# Patient Record
Sex: Female | Born: 1950
Health system: Southern US, Community
[De-identification: ages and names within clinical notes are randomized; demographics above are authoritative.]

## PROBLEM LIST (undated history)

## (undated) DIAGNOSIS — H269 Unspecified cataract: Secondary | ICD-10-CM

## (undated) DIAGNOSIS — E119 Type 2 diabetes mellitus without complications: Secondary | ICD-10-CM

## (undated) DIAGNOSIS — R112 Nausea with vomiting, unspecified: Secondary | ICD-10-CM

## (undated) DIAGNOSIS — Z5189 Encounter for other specified aftercare: Secondary | ICD-10-CM

## (undated) DIAGNOSIS — C801 Malignant (primary) neoplasm, unspecified: Secondary | ICD-10-CM

## (undated) DIAGNOSIS — Z1379 Encounter for other screening for genetic and chromosomal anomalies: Principal | ICD-10-CM

## (undated) DIAGNOSIS — T7840XA Allergy, unspecified, initial encounter: Secondary | ICD-10-CM

## (undated) DIAGNOSIS — Z9889 Other specified postprocedural states: Secondary | ICD-10-CM

## (undated) DIAGNOSIS — R21 Rash and other nonspecific skin eruption: Secondary | ICD-10-CM

## (undated) DIAGNOSIS — J189 Pneumonia, unspecified organism: Secondary | ICD-10-CM

## (undated) DIAGNOSIS — R252 Cramp and spasm: Secondary | ICD-10-CM

## (undated) HISTORY — DX: Allergy, unspecified, initial encounter: T78.40XA

## (undated) HISTORY — PX: TUBAL LIGATION: SHX77

## (undated) HISTORY — DX: Unspecified cataract: H26.9

## (undated) HISTORY — DX: Encounter for other specified aftercare: Z51.89

## (undated) HISTORY — PX: EYE SURGERY: SHX253

## (undated) HISTORY — PX: MOUTH SURGERY: SHX715

## (undated) HISTORY — PX: ABDOMINAL HYSTERECTOMY: SHX81

## (undated) HISTORY — DX: Encounter for other screening for genetic and chromosomal anomalies: Z13.79

## (undated) HISTORY — PX: DILATION AND CURETTAGE OF UTERUS: SHX78

## (undated) HISTORY — PX: COLONOSCOPY: SHX174

---

## 1998-11-17 ENCOUNTER — Ambulatory Visit (HOSPITAL_COMMUNITY): Admission: RE | Admit: 1998-11-17 | Discharge: 1998-11-17 | Payer: Self-pay | Admitting: Obstetrics and Gynecology

## 1998-11-25 ENCOUNTER — Encounter: Payer: Self-pay | Admitting: Obstetrics and Gynecology

## 1998-11-25 ENCOUNTER — Ambulatory Visit (HOSPITAL_COMMUNITY): Admission: RE | Admit: 1998-11-25 | Discharge: 1998-11-25 | Payer: Self-pay | Admitting: Obstetrics and Gynecology

## 1999-09-02 ENCOUNTER — Other Ambulatory Visit: Admission: RE | Admit: 1999-09-02 | Discharge: 1999-09-02 | Payer: Self-pay | Admitting: Obstetrics and Gynecology

## 2001-07-05 ENCOUNTER — Ambulatory Visit (HOSPITAL_COMMUNITY): Admission: RE | Admit: 2001-07-05 | Discharge: 2001-07-05 | Payer: Self-pay | Admitting: Internal Medicine

## 2001-08-07 ENCOUNTER — Other Ambulatory Visit: Admission: RE | Admit: 2001-08-07 | Discharge: 2001-08-07 | Payer: Self-pay | Admitting: Obstetrics and Gynecology

## 2002-11-28 ENCOUNTER — Other Ambulatory Visit: Admission: RE | Admit: 2002-11-28 | Discharge: 2002-11-28 | Payer: Self-pay | Admitting: Obstetrics and Gynecology

## 2003-05-02 ENCOUNTER — Other Ambulatory Visit: Admission: RE | Admit: 2003-05-02 | Discharge: 2003-05-02 | Payer: Self-pay | Admitting: Obstetrics and Gynecology

## 2004-04-23 ENCOUNTER — Other Ambulatory Visit: Admission: RE | Admit: 2004-04-23 | Discharge: 2004-04-23 | Payer: Self-pay | Admitting: Obstetrics and Gynecology

## 2004-06-17 ENCOUNTER — Encounter: Admission: RE | Admit: 2004-06-17 | Discharge: 2004-06-17 | Payer: Self-pay | Admitting: Gastroenterology

## 2004-07-27 ENCOUNTER — Ambulatory Visit (HOSPITAL_COMMUNITY): Admission: RE | Admit: 2004-07-27 | Discharge: 2004-07-27 | Payer: Self-pay | Admitting: Gastroenterology

## 2004-07-27 ENCOUNTER — Encounter (INDEPENDENT_AMBULATORY_CARE_PROVIDER_SITE_OTHER): Payer: Self-pay | Admitting: *Deleted

## 2005-11-04 ENCOUNTER — Other Ambulatory Visit: Admission: RE | Admit: 2005-11-04 | Discharge: 2005-11-04 | Payer: Self-pay | Admitting: Obstetrics and Gynecology

## 2010-02-27 ENCOUNTER — Emergency Department (HOSPITAL_COMMUNITY): Admission: EM | Admit: 2010-02-27 | Discharge: 2010-02-27 | Payer: Self-pay | Admitting: Emergency Medicine

## 2011-05-07 NOTE — Op Note (Signed)
NAME:  Angela Mays, Angela Mays                          ACCOUNT NO.:  [id number]   MEDICAL RECORD NO.:  [id number]                   PATIENT TYPE:  AMB   LOCATION:  ENDO                                 FACILITY:  [facility]   PHYSICIAN:  Anselmo Rod, M.D.               DATE OF BIRTH:  03-13-1951   DATE OF PROCEDURE:  DATE OF DISCHARGE:                                 OPERATIVE REPORT   ADDENDUM:  Addendum to report #[id number].   Some patchy erythema was noted in the distal small-bowel and this area was  biopsied for pathology to rule out sprue.  The patient has a history of  anemia in the past.                                               Anselmo Rod, M.D.    JNM/MEDQ  D:  07/27/2004  T:  07/27/2004  Job:  [id number]   cc:   Dois Davenport A. Rivard, M.D.  [address]  [place]  [place] [address]  Fax: [phone]

## 2011-05-07 NOTE — Op Note (Signed)
NAME:  Angela Mays, ROCHEFORT                          ACCOUNT NO.:  [id number]   MEDICAL RECORD NO.:  [id number]                   PATIENT TYPE:  AMB   LOCATION:  ENDO                                 FACILITY:  [facility]   PHYSICIAN:  Anselmo Rod, M.D.               DATE OF BIRTH:  06-18-1951   DATE OF PROCEDURE:  07/27/2004  DATE OF DISCHARGE:                                 OPERATIVE REPORT   PROCEDURE PERFORMED:  Esophagogastroduodenoscopy with biopsies.   ENDOSCOPIST:  Charna Elizabeth, M.D.   INSTRUMENT USED:  Olympus video pan endoscope.   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  Esophageal peristalsis was noted.  An EGD is being done to rule out esophagitis, etc.   PREPROCEDURE PREPARATION:  Informed consent was secured from the patient.  The patient fasted for eight hours prior to procedure.   PREPROCEDURE PHYSICAL:  Patient stable vital signs.  NECK:  Supple.  CHEST:  Clear to auscultation.  S1, S2 regular.  ABDOMEN:  Soft with normal bowel sounds.   DESCRIPTION OF THE PROCEDURE:  The patient was placed in the left lateral  decubitus position, sedated with 50 mg of Demerol and 7 mg of Versed in slow  incremental doses.  Once the patient was adequately sedated and maintained  on low flow oxygen and continuous cardiac monitoring, the Olympus video pan  endoscope was advanced with the mouthpiece over the tongue, into the  esophagus under direct vision.  The entire esophagus appeared normal with no  evidence of ring, esophagitis or Barrett's mucosa.  The esophagus was widely  patent.  A small hiatal hernia was seen.  On high retroflexion, antral  gastritis was noted with some erosions in the duodenal bulb.  Small bowel  __________ up to 60 cm appeared normal.  Biopsies were done from the antrum  to rule out presence of H. pylori by  pathology.   IMPRESSION:  1. Widely patent esophagus.  2. Small hiatal hernia.  3. Antral gastritis with erosions in the duodenal bulb, biopsies done for     Helicobacter pylori.   RECOMMENDATIONS:  1. Await pathology results.  2. Avoid nonsteroidal including aspirin for the next four weeks.  3. Outpatient followup in the next two weeks.  Further recommendations will     be made at that time.                                               Anselmo Rod, M.D.    JNM/MEDQ  D:  07/27/2004  T:  07/27/2004  Job:  [id number]   cc:   Dois Davenport A. Rivard, M.D.  [phone]  [address]  [place]  [place] [address]  Fax: [phone]   Charlaine Dalton. Sherene Sires, M.D. [facility]

## 2011-05-07 NOTE — Op Note (Signed)
NAME:  Angela Mays, FUNCHES NO.:  [id number]   MEDICAL RECORD NO.:  [id number]                   PATIENT TYPE:  AMB   LOCATION:  ENDO                                 FACILITY:  [facility]   PHYSICIAN:  Anselmo Rod, M.D.               DATE OF BIRTH:  Jul 12, 1951   DATE OF PROCEDURE:  07/27/2004  DATE OF DISCHARGE:                                 OPERATIVE REPORT   PROCEDURE PERFORMED:  Colonoscopy with multiple biopsies from the right  colon.   ENDOSCOPIST:  Anselmo Rod, M.D.   INSTRUMENT USED:  Olympus video colonoscope.   INDICATIONS FOR PROCEDURE:  A 60 year old white female who underwent  screening colonoscopy.  There is a history of colon cancer in a paternal  grandmother.  Rule out colonic polyps, masses, etc.   PREPROCEDURE PREPARATION:  Informed consent was procured from the patient.  The patient fasted for eight hours prior to the procedure and prepped with a  bottle of magnesium citrate and a gallop of GoLYTELY the night prior to the  procedure.   PREPROCEDURE PHYSICAL:  VITAL SIGNS:  Stable vital signs.  NECK:  Supple.  CHEST:  Clear to auscultation.  CARDIOVASCULAR:  S1 and S2 regular.  ABDOMEN:  Soft with normal bowel sounds.   DESCRIPTION OF PROCEDURE:  The patient was placed in left lateral decubitus  position, sedated with an additional 20 mg of Demerol and 1 mg of Versed in  slow incremental doses.  Once the patient was adequately sedated and  maintained on low flow oxygen and continuous cardiac monitoring, the Olympus  video colonoscope was advanced from the rectum to the cecum.  Nodular  changes were noted in the mucosa of the right colon.  Multiple biopsies were  done.  Small internal hemorrhoids were appreciated on retroflexion.  The  rectum, appendiceal orifice and the ileocecal valve were clearly visualized  and photographed.  The terminal ileum appeared healthy and without lesions.  No masses, polyps erosions or diverticula  were appreciated.  The patient  tolerated the procedure well without any immediate complications.   IMPRESSION:  1. Small nonbleeding internal hemorrhoids.  2. Nodular changes in mucosa of right colon biopsied.  3. No masses, polyps or diverticula seen.  4. Normal terminal ileum.  5. Some patchy erythema was noted in the distal small-bowel and this area     was biopsied for pathology to rule out sprue.  The patient has a history     of anemia in the past.   RECOMMENDATIONS:  1. Await pathology results.  2. Outpatient follow-up in the next two weeks for further recommendations.                                               Jyothi Nat  Loreta Ave, M.D.    JNM/MEDQ  D:  07/27/2004  T:  07/27/2004  Job:  [id number]   cc:   Dois Davenport A. Rivard, M.D.  [address], [address]  [place]  [place] [address]  Fax: [phone]   Charlaine Dalton. Sherene Sires, M.D. [facility]

## 2014-07-08 ENCOUNTER — Encounter (HOSPITAL_COMMUNITY): Payer: Self-pay | Admitting: Emergency Medicine

## 2014-07-08 ENCOUNTER — Emergency Department (INDEPENDENT_AMBULATORY_CARE_PROVIDER_SITE_OTHER)
Admission: EM | Admit: 2014-07-08 | Discharge: 2014-07-08 | Disposition: A | Discharge status: Home / Self Care | Payer: Managed Care, Other (non HMO) | Source: Home / Self Care | Attending: Family Medicine | Admitting: Family Medicine

## 2014-07-08 DIAGNOSIS — R002 Palpitations: Secondary | ICD-10-CM

## 2014-07-08 DIAGNOSIS — M19079 Primary osteoarthritis, unspecified ankle and foot: Secondary | ICD-10-CM

## 2014-07-08 DIAGNOSIS — L259 Unspecified contact dermatitis, unspecified cause: Secondary | ICD-10-CM

## 2014-07-08 MED ORDER — PREDNISONE 10 MG PO TABS: ORAL_TABLET | ORAL | Status: DC

## 2014-07-08 MED ORDER — BETAMETHASONE DIPROPIONATE AUG 0.05 % EX CREA: TOPICAL_CREAM | Freq: Two times a day (BID) | CUTANEOUS | Status: DC

## 2014-07-08 MED ORDER — MELOXICAM 15 MG PO TABS: 15.0000 mg | ORAL_TABLET | Freq: Every day | ORAL | Status: DC

## 2014-07-08 NOTE — Discharge Instructions (Signed)
Contact Dermatitis Contact dermatitis is a reaction to certain substances that touch the skin. Contact dermatitis can be either irritant contact dermatitis or allergic contact dermatitis. Irritant contact dermatitis does not require previous exposure to the substance for a reaction to occur.Allergic contact dermatitis only occurs if you have been exposed to the substance before. Upon a repeat exposure, your body reacts to the substance.  CAUSES  Many substances can cause contact dermatitis. Irritant dermatitis is most commonly caused by repeated exposure to mildly irritating substances, such as:  Makeup.  Soaps.  Detergents.  Bleaches.  Acids.  Metal salts, such as nickel. Allergic contact dermatitis is most commonly caused by exposure to:  Poisonous plants.  Chemicals (deodorants, shampoos).  Jewelry.  Latex.  Neomycin in triple antibiotic cream.  Preservatives in products, including clothing. SYMPTOMS  The area of skin that is exposed may develop:  Dryness or flaking.  Redness.  Cracks.  Itching.  Pain or a burning sensation.  Blisters. With allergic contact dermatitis, there may also be swelling in areas such as the eyelids, mouth, or genitals.  DIAGNOSIS  Your caregiver can usually tell what the problem is by doing a physical exam. In cases where the cause is uncertain and an allergic contact dermatitis is suspected, a patch skin test may be performed to help determine the cause of your dermatitis. TREATMENT Treatment includes protecting the skin from further contact with the irritating substance by avoiding that substance if possible. Barrier creams, powders, and gloves may be helpful. Your caregiver may also recommend:  Steroid creams or ointments applied 2 times daily. For best results, soak the rash area in cool water for 20 minutes. Then apply the medicine. Cover the area with a plastic wrap. You can store the steroid cream in the refrigerator for a "chilly"  effect on your rash. That may decrease itching. Oral steroid medicines may be needed in more severe cases.  Antibiotics or antibacterial ointments if a skin infection is present.  Antihistamine lotion or an antihistamine taken by mouth to ease itching.  Lubricants to keep moisture in your skin.  Burow's solution to reduce redness and soreness or to dry a weeping rash. Mix one packet or tablet of solution in 2 cups cool water. Dip a clean washcloth in the mixture, wring it out a bit, and put it on the affected area. Leave the cloth in place for 30 minutes. Do this as often as possible throughout the day.  Taking several cornstarch or baking soda baths daily if the area is too large to cover with a washcloth. Harsh chemicals, such as alkalis or acids, can cause skin damage that is like a burn. You should flush your skin for 15 to 20 minutes with cold water after such an exposure. You should also seek immediate medical care after exposure. Bandages (dressings), antibiotics, and pain medicine may be needed for severely irritated skin.  HOME CARE INSTRUCTIONS  Avoid the substance that caused your reaction.  Keep the area of skin that is affected away from hot water, soap, sunlight, chemicals, acidic substances, or anything else that would irritate your skin.  Do not scratch the rash. Scratching may cause the rash to become infected.  You may take cool baths to help stop the itching.  Only take over-the-counter or prescription medicines as directed by your caregiver.  See your caregiver for follow-up care as directed to make sure your skin is healing properly. SEEK MEDICAL CARE IF:   Your condition is not better after 3  days of treatment.  You seem to be getting worse.  You see signs of infection such as swelling, tenderness, redness, soreness, or warmth in the affected area.  You have any problems related to your medicines. Document Released: 12/03/2000 Document Revised: 02/28/2012  Document Reviewed: 05/11/2011 [facility] Patient Information 2015 [place], [place]. This information is not intended to replace advice given to you by your health care provider. Make sure you discuss any questions you have with your health care provider.  Arthritis, Nonspecific Arthritis is inflammation of a joint. This usually means pain, redness, warmth or swelling are present. One or more joints may be involved. There are a number of types of arthritis. Your caregiver may not be able to tell what type of arthritis you have right away. CAUSES  The most common cause of arthritis is the wear and tear on the joint (osteoarthritis). This causes damage to the cartilage, which can break down over time. The knees, hips, back and neck are most often affected by this type of arthritis. Other types of arthritis and common causes of joint pain include:  Sprains and other injuries near the joint. Sometimes minor sprains and injuries cause pain and swelling that develop hours later.  Rheumatoid arthritis. This affects hands, feet and knees. It usually affects both sides of your body at the same time. It is often associated with chronic ailments, fever, weight loss and general weakness.  Crystal arthritis. Gout and pseudo gout can cause occasional acute severe pain, redness and swelling in the foot, ankle, or knee.  Infectious arthritis. Bacteria can get into a joint through a break in overlying skin. This can cause infection of the joint. Bacteria and viruses can also spread through the blood and affect your joints.  Drug, infectious and allergy reactions. Sometimes joints can become mildly painful and slightly swollen with these types of illnesses. SYMPTOMS   Pain is the main symptom.  Your joint or joints can also be red, swollen and warm or hot to the touch.  You may have a fever with certain types of arthritis, or even feel overall ill.  The joint with arthritis will hurt with movement. Stiffness is  present with some types of arthritis. DIAGNOSIS  Your caregiver will suspect arthritis based on your description of your symptoms and on your exam. Testing may be needed to find the type of arthritis:  Blood and sometimes urine tests.  X-ray tests and sometimes CT or MRI scans.  Removal of fluid from the joint (arthrocentesis) is done to check for bacteria, crystals or other causes. Your caregiver (or a specialist) will numb the area over the joint with a local anesthetic, and use a needle to remove joint fluid for examination. This procedure is only minimally uncomfortable.  Even with these tests, your caregiver may not be able to tell what kind of arthritis you have. Consultation with a specialist (rheumatologist) may be helpful. TREATMENT  Your caregiver will discuss with you treatment specific to your type of arthritis. If the specific type cannot be determined, then the following general recommendations may apply. Treatment of severe joint pain includes:  Rest.  Elevation.  Anti-inflammatory medication (for example, ibuprofen) may be prescribed. Avoiding activities that cause increased pain.  Only take over-the-counter or prescription medicines for pain and discomfort as recommended by your caregiver.  Cold packs over an inflamed joint may be used for 10 to 15 minutes every hour. Hot packs sometimes feel better, but do not use overnight. Do not use hot packs  if you are diabetic without your caregiver's permission.  A cortisone shot into arthritic joints may help reduce pain and swelling.  Any acute arthritis that gets worse over the next 1 to 2 days needs to be looked at to be sure there is no joint infection. Long-term arthritis treatment involves modifying activities and lifestyle to reduce joint stress jarring. This can include weight loss. Also, exercise is needed to nourish the joint cartilage and remove waste. This helps keep the muscles around the joint strong. HOME CARE  INSTRUCTIONS   Do not take aspirin to relieve pain if gout is suspected. This elevates uric acid levels.  Only take over-the-counter or prescription medicines for pain, discomfort or fever as directed by your caregiver.  Rest the joint as much as possible.  If your joint is swollen, keep it elevated.  Use crutches if the painful joint is in your leg.  Drinking plenty of fluids may help for certain types of arthritis.  Follow your caregiver's dietary instructions.  Try low-impact exercise such as:  Swimming.  Water aerobics.  Biking.  Walking.  Morning stiffness is often relieved by a warm shower.  Put your joints through regular range-of-motion. SEEK MEDICAL CARE IF:   You do not feel better in 24 hours or are getting worse.  You have side effects to medications, or are not getting better with treatment. SEEK IMMEDIATE MEDICAL CARE IF:   You have a fever.  You develop severe joint pain, swelling or redness.  Many joints are involved and become painful and swollen.  There is severe back pain and/or leg weakness.  You have loss of bowel or bladder control. Document Released: 01/13/2005 Document Revised: 02/28/2012 Document Reviewed: 01/29/2009 [facility] Patient Information 2015 [place], [place]. This information is not intended to replace advice given to you by your health care provider. Make sure you discuss any questions you have with your health care provider.  Palpitations A palpitation is the feeling that your heartbeat is irregular or is faster than normal. It may feel like your heart is fluttering or skipping a beat. Palpitations are usually not a serious problem. However, in some cases, you may need further medical evaluation. CAUSES  Palpitations can be caused by:  Smoking.  Caffeine or other stimulants, such as diet pills or energy drinks.  Alcohol.  Stress and anxiety.  Strenuous physical activity.  Fatigue.  Certain medicines.  Heart disease,  especially if you have a history of irregular heart rhythms (arrhythmias), such as atrial fibrillation, atrial flutter, or supraventricular tachycardia.  An improperly working pacemaker or defibrillator. DIAGNOSIS  To find the cause of your palpitations, your health care provider will take your medical history and perform a physical exam. Your health care provider may also have you take a test called an ambulatory electrocardiogram (ECG). An ECG records your heartbeat patterns over a 24-hour period. You may also have other tests, such as:  Transthoracic echocardiogram (TTE). During echocardiography, sound waves are used to evaluate how blood flows through your heart.  Transesophageal echocardiogram (TEE).  Cardiac monitoring. This allows your health care provider to monitor your heart rate and rhythm in real time.  Holter monitor. This is a portable device that records your heartbeat and can help diagnose heart arrhythmias. It allows your health care provider to track your heart activity for several days, if needed.  Stress tests by exercise or by giving medicine that makes the heart beat faster. TREATMENT  Treatment of palpitations depends on the cause of your symptoms  and can vary greatly. Most cases of palpitations do not require any treatment other than time, relaxation, and monitoring your symptoms. Other causes, such as atrial fibrillation, atrial flutter, or supraventricular tachycardia, usually require further treatment. HOME CARE INSTRUCTIONS   Avoid:  Caffeinated coffee, tea, soft drinks, diet pills, and energy drinks.  Chocolate.  Alcohol.  Stop smoking if you smoke.  Reduce your stress and anxiety. Things that can help you relax include:  A method of controlling things in your body, such as your heartbeats, with your mind (biofeedback).  Yoga.  Meditation.  Physical activity such as swimming, jogging, or walking.  Get plenty of rest and sleep. SEEK MEDICAL CARE IF:    You continue to have a fast or irregular heartbeat beyond 24 hours.  Your palpitations occur more often. SEEK IMMEDIATE MEDICAL CARE IF:  You have chest pain or shortness of breath.  You have a severe headache.  You feel dizzy or you faint. MAKE SURE YOU:  Understand these instructions.  Will watch your condition.  Will get help right away if you are not doing well or get worse. Document Released: 12/03/2000 Document Revised: 12/11/2013 Document Reviewed: 02/04/2012 [facility] Patient Information 2015 [place], [place]. This information is not intended to replace advice given to you by your health care provider. Make sure you discuss any questions you have with your health care provider.

## 2014-07-08 NOTE — ED Notes (Signed)
Pt c/o intermittent right ankle pain x3 weeks Sx also include swelling; pain is 4/10 Also c/o rash on both arms x1 week  Denies f/v/n/d, cold sx Alert w/no signs of acute distress.

## 2014-07-08 NOTE — ED Provider Notes (Signed)
CSN: [id number]     Arrival date & time 07/08/14  1740 History   First MD Initiated Contact with Patient 07/08/14 1904     Chief Complaint  Patient presents with  . Ankle Pain   (Consider location/radiation/quality/duration/timing/severity/associated sxs/prior Treatment) HPI Comments: 63 year old female presents with multiple complaints.   She has right ankle pain, intermittent, for 3 weeks. The pain is worse when she has been sitting for a long time or when she first wakes up in the morning and seems to loosen up with walking. She feels the pain in the medial and lateral joint lines of the ankle. She has some intermittent mild swelling of the ankle. No treatment attempted.  She also complains of a rash on both elbows for about a week. It started as one small bump, and now she has multiple bumps on the extensor surfaces of the elbows that are itchy. She has never had this before. The treatment attempted. No rash elsewhere.  Finally, she complains of frequent palpitations. She has had these for months. They seem to gotten worse. She feels slightly breathless at times. No other associated symptoms such as chest pain, leg swelling, shortness of breath.   History reviewed. No pertinent past medical history. History reviewed. No pertinent past surgical history. No family history on file. History  Substance Use Topics  . Smoking status: Never Smoker   . Smokeless tobacco: Not on file  . Alcohol Use: No   OB History   Grav Para Term Preterm Abortions TAB SAB Ect Mult Living                 Review of Systems  Constitutional: Negative for fever and chills.  Eyes: Negative for visual disturbance.  Respiratory: Negative for cough, chest tightness and shortness of breath.   Cardiovascular: Positive for palpitations. Negative for chest pain and leg swelling.  Gastrointestinal: Negative for nausea, vomiting and abdominal pain.  Endocrine: Negative for polydipsia and polyuria.  Genitourinary:  Negative for dysuria, urgency and frequency.  Musculoskeletal: Positive for arthralgias and joint swelling. Negative for back pain, gait problem and myalgias.  Skin: Positive for rash.  Neurological: Negative for dizziness, weakness and light-headedness.  All other systems reviewed and are negative.   Allergies  Flagyl  Home Medications   Prior to Admission medications   Medication Sig Start Date End Date Taking? Authorizing Provider  augmented betamethasone dipropionate (DIPROLENE AF) 0.05 % cream Apply topically 2 (two) times daily. 07/08/14   Liam Graham, PA-C  meloxicam (MOBIC) 15 MG tablet Take 1 tablet (15 mg total) by mouth daily. 07/08/14   Liam Graham, PA-C  predniSONE (DELTASONE) 10 MG tablet 4 tabs PO QD for 4 days; 3 tabs PO QD for 3 days; 2 tabs PO QD for 2 days; 1 tab PO QD for 1 day 07/08/14   Liam Graham, PA-C   BP 171/77  Pulse 72  Temp(Src) 98.5 F (36.9 C) (Oral)  Resp 12  SpO2 98% Physical Exam  Nursing note and vitals reviewed. Constitutional: She is oriented to person, place, and time. Vital signs are normal. She appears well-developed and well-nourished. No distress.  HENT:  Head: Normocephalic and atraumatic.  Cardiovascular: Normal rate, regular rhythm, normal heart sounds, intact distal pulses and normal pulses.  Frequent extrasystoles are present.  No murmur heard. Pulmonary/Chest: Effort normal. No respiratory distress.  Musculoskeletal:       Right ankle: She exhibits swelling (mild, diffuse, not extending down to the foot). She exhibits  normal range of motion and normal pulse. Tenderness (Mild, medial and lateral, just distal to the malleolus). Achilles tendon normal.       Left ankle: Normal. Achilles tendon normal.  Neurological: She is alert and oriented to person, place, and time. She has normal strength. Coordination normal.  Skin: Skin is warm and dry. Rash noted. Rash is papular (extensor surfaces of both elbows, worse on the left).  She is not diaphoretic.  Psychiatric: She has a normal mood and affect. Judgment normal.    ED Course  ED EKG  Date/Time: 07/09/2014 8:16 AM Performed by: Allena Katz, H Authorized by: Allena Katz, H Rhythm: sinus rhythm Rate: normal QRS axis: normal Conduction: conduction normal ST Segments: ST segments normal T Waves: T waves normal Other: no other findings Clinical impression: normal ECG Comments: 2 minute rhythm strip obtained with no arrhythmia noted   (including critical care time) Labs Review Labs Reviewed - No data to display  Imaging Review No results found.   MDM   1. Contact dermatitis   2. Arthritis of ankle   3. Palpitations    The rash on the elbows is consistent with contact dermatitis The ankle history and exam is consistent with mild to moderate arthritis EKG obtained to evaluate palpitations, it was entirely normal Treating with steroid cream on the elbow, or prednisone for both the rash any arthritis, daily anti-inflammatory. Followup when necessary  Meds ordered this encounter  Medications  . augmented betamethasone dipropionate (DIPROLENE AF) 0.05 % cream    Sig: Apply topically 2 (two) times daily.    Dispense:  30 g    Refill:  0    Order Specific Question:  Supervising Provider    Answer:  Melony Overly [id number]  . predniSONE (DELTASONE) 10 MG tablet    Sig: 4 tabs PO QD for 4 days; 3 tabs PO QD for 3 days; 2 tabs PO QD for 2 days; 1 tab PO QD for 1 day    Dispense:  30 tablet    Refill:  0    Order Specific Question:  Supervising Provider    Answer:  Melony Overly [id number]  . meloxicam (MOBIC) 15 MG tablet    Sig: Take 1 tablet (15 mg total) by mouth daily.    Dispense:  30 tablet    Refill:  1    Order Specific Question:  Supervising Provider    Answer:  Melony Overly [[id number]]      Liam Graham, PA-C 07/09/14 [phone]

## 2014-07-09 NOTE — ED Provider Notes (Signed)
Medical screening examination/treatment/procedure(s) were performed by resident physician or non-physician practitioner and as supervising physician I was immediately available for consultation/collaboration.   Pauline Good MD.   Billy Fischer, MD 07/09/14 1130

## 2014-10-29 ENCOUNTER — Ambulatory Visit: Payer: Managed Care, Other (non HMO)

## 2014-11-05 ENCOUNTER — Ambulatory Visit: Payer: Managed Care, Other (non HMO)

## 2014-11-12 ENCOUNTER — Ambulatory Visit: Payer: Managed Care, Other (non HMO)

## 2014-11-20 ENCOUNTER — Ambulatory Visit: Payer: Self-pay | Admitting: Internal Medicine

## 2015-03-03 DIAGNOSIS — Z13220 Encounter for screening for lipoid disorders: Secondary | ICD-10-CM | POA: Diagnosis not present

## 2015-03-03 DIAGNOSIS — E1165 Type 2 diabetes mellitus with hyperglycemia: Secondary | ICD-10-CM | POA: Diagnosis not present

## 2015-03-03 DIAGNOSIS — Z23 Encounter for immunization: Secondary | ICD-10-CM | POA: Diagnosis not present

## 2015-07-01 DIAGNOSIS — J209 Acute bronchitis, unspecified: Secondary | ICD-10-CM | POA: Diagnosis not present

## 2015-08-29 ENCOUNTER — Other Ambulatory Visit: Payer: Self-pay | Admitting: Obstetrics and Gynecology

## 2015-08-29 DIAGNOSIS — Z6835 Body mass index (BMI) 35.0-35.9, adult: Secondary | ICD-10-CM | POA: Diagnosis not present

## 2015-08-29 DIAGNOSIS — Z1231 Encounter for screening mammogram for malignant neoplasm of breast: Secondary | ICD-10-CM | POA: Diagnosis not present

## 2015-08-29 DIAGNOSIS — Z124 Encounter for screening for malignant neoplasm of cervix: Secondary | ICD-10-CM | POA: Diagnosis not present

## 2015-09-01 LAB — CYTOLOGY - PAP

## 2015-09-02 ENCOUNTER — Other Ambulatory Visit: Payer: Self-pay | Admitting: Obstetrics and Gynecology

## 2015-09-02 DIAGNOSIS — R928 Other abnormal and inconclusive findings on diagnostic imaging of breast: Secondary | ICD-10-CM

## 2015-09-03 DIAGNOSIS — E119 Type 2 diabetes mellitus without complications: Secondary | ICD-10-CM | POA: Diagnosis not present

## 2015-09-09 ENCOUNTER — Ambulatory Visit
Admission: RE | Admit: 2015-09-09 | Discharge: 2015-09-09 | Disposition: A | Discharge status: Home / Self Care | Payer: Medicare Other | Source: Ambulatory Visit | Attending: Obstetrics and Gynecology | Admitting: Obstetrics and Gynecology

## 2015-09-09 DIAGNOSIS — R928 Other abnormal and inconclusive findings on diagnostic imaging of breast: Secondary | ICD-10-CM

## 2016-03-24 DIAGNOSIS — Z23 Encounter for immunization: Secondary | ICD-10-CM | POA: Diagnosis not present

## 2016-03-24 DIAGNOSIS — Z7984 Long term (current) use of oral hypoglycemic drugs: Secondary | ICD-10-CM | POA: Diagnosis not present

## 2016-03-24 DIAGNOSIS — E119 Type 2 diabetes mellitus without complications: Secondary | ICD-10-CM | POA: Diagnosis not present

## 2016-03-24 DIAGNOSIS — Z0000 Encounter for general adult medical examination without abnormal findings: Secondary | ICD-10-CM | POA: Diagnosis not present

## 2016-07-24 IMAGING — MG MM DIAG BREAST TOMO UNI RIGHT
4 series · 4 of 12 positions shown · non-contrast
Comparison: Previous exam(s).

CLINICAL DATA: 64-year-old female presenting for screening recall
of a possible area of distortion in the right breast.

EXAM:
DIGITAL DIAGNOSTIC RIGHT MAMMOGRAM WITH 3D TOMOSYNTHESIS AND CAD

[R MLO]
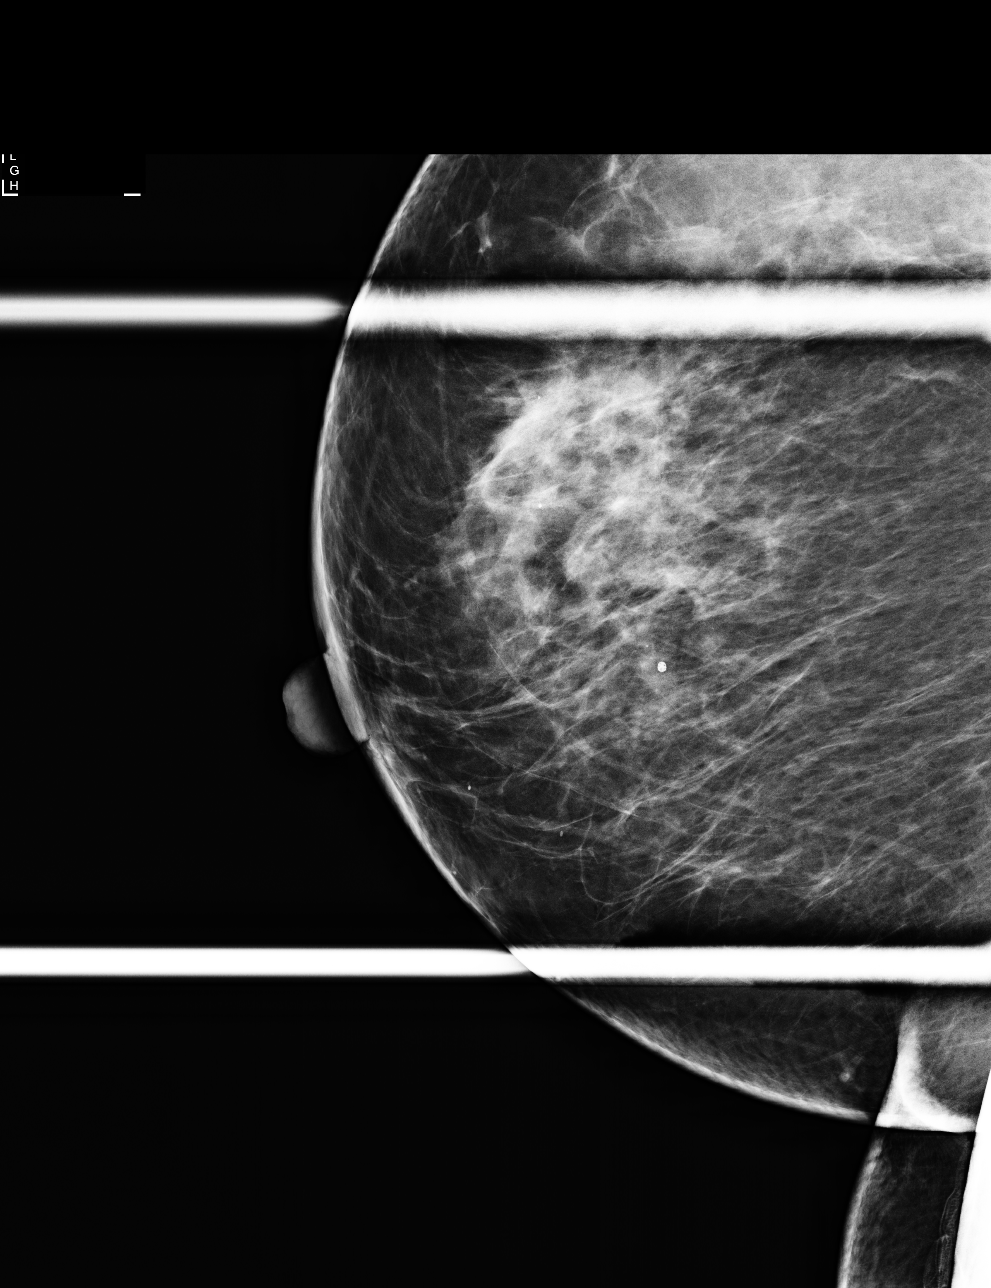

[R CC]
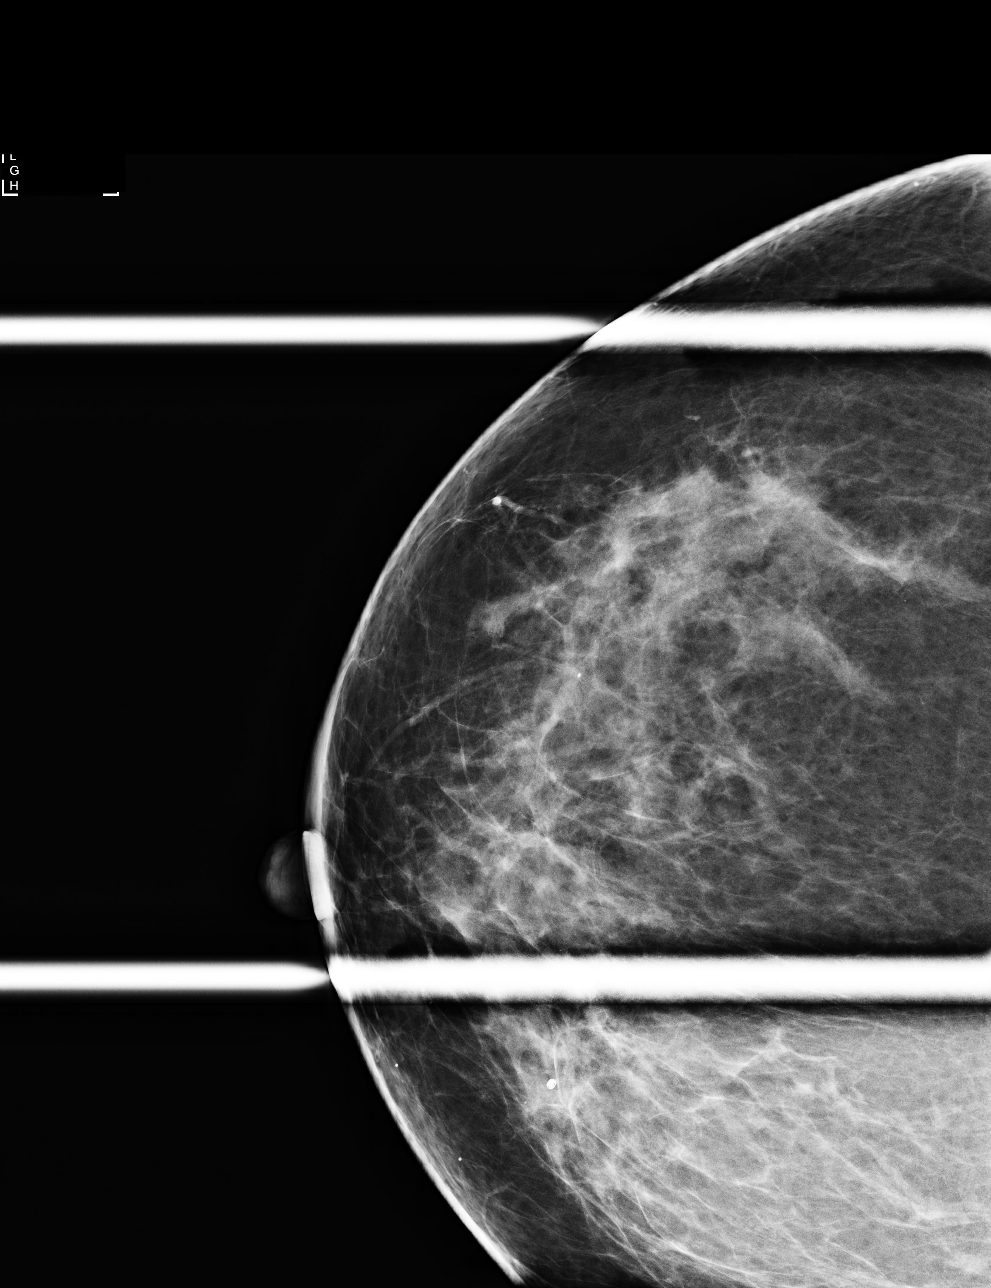

[R CC tomo · tomo slice 43/85.0]
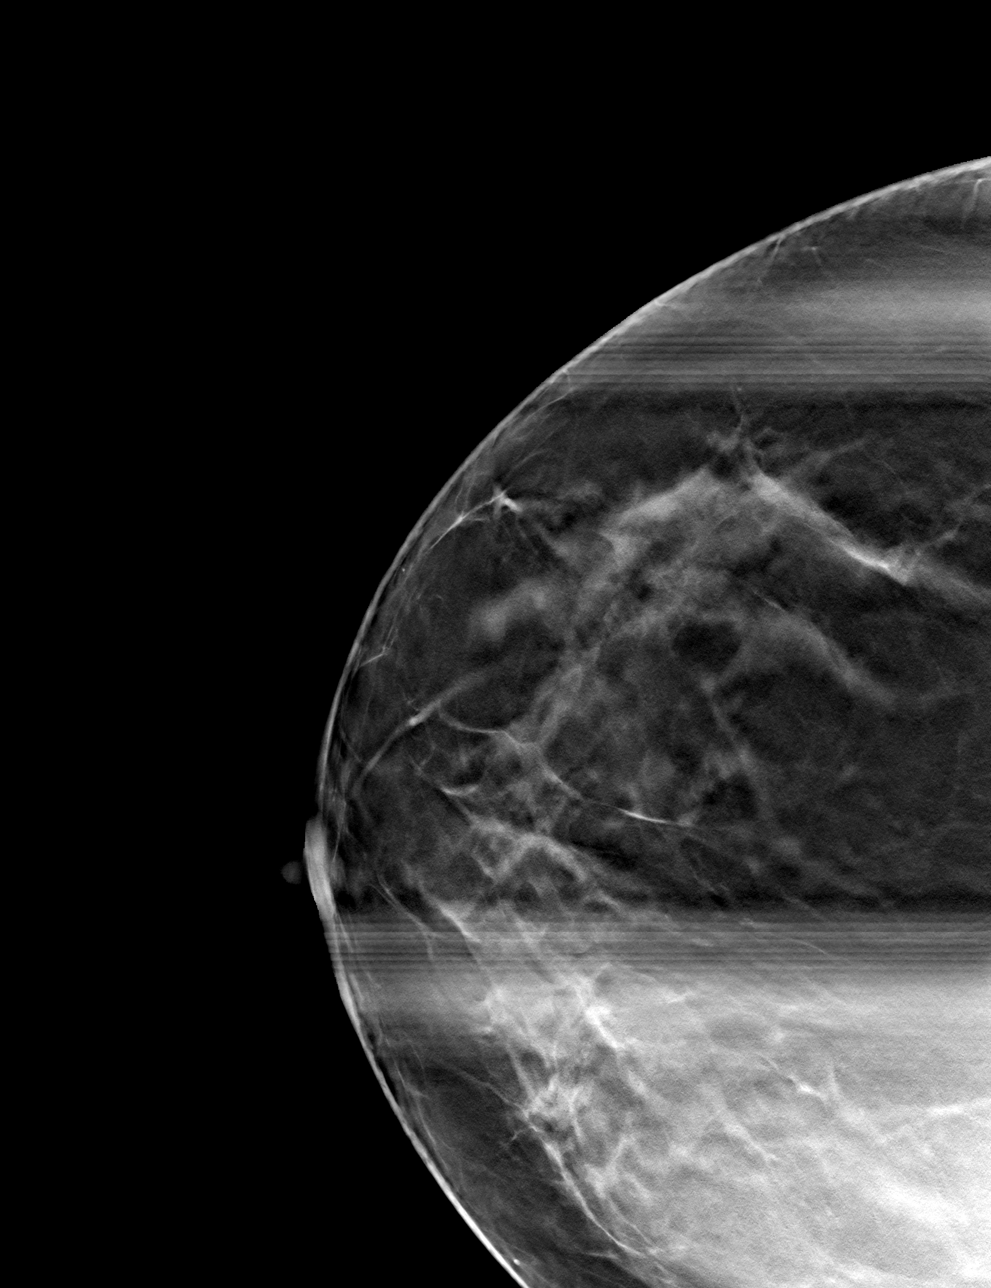

[R MLO tomo · tomo slice 45/89.0]
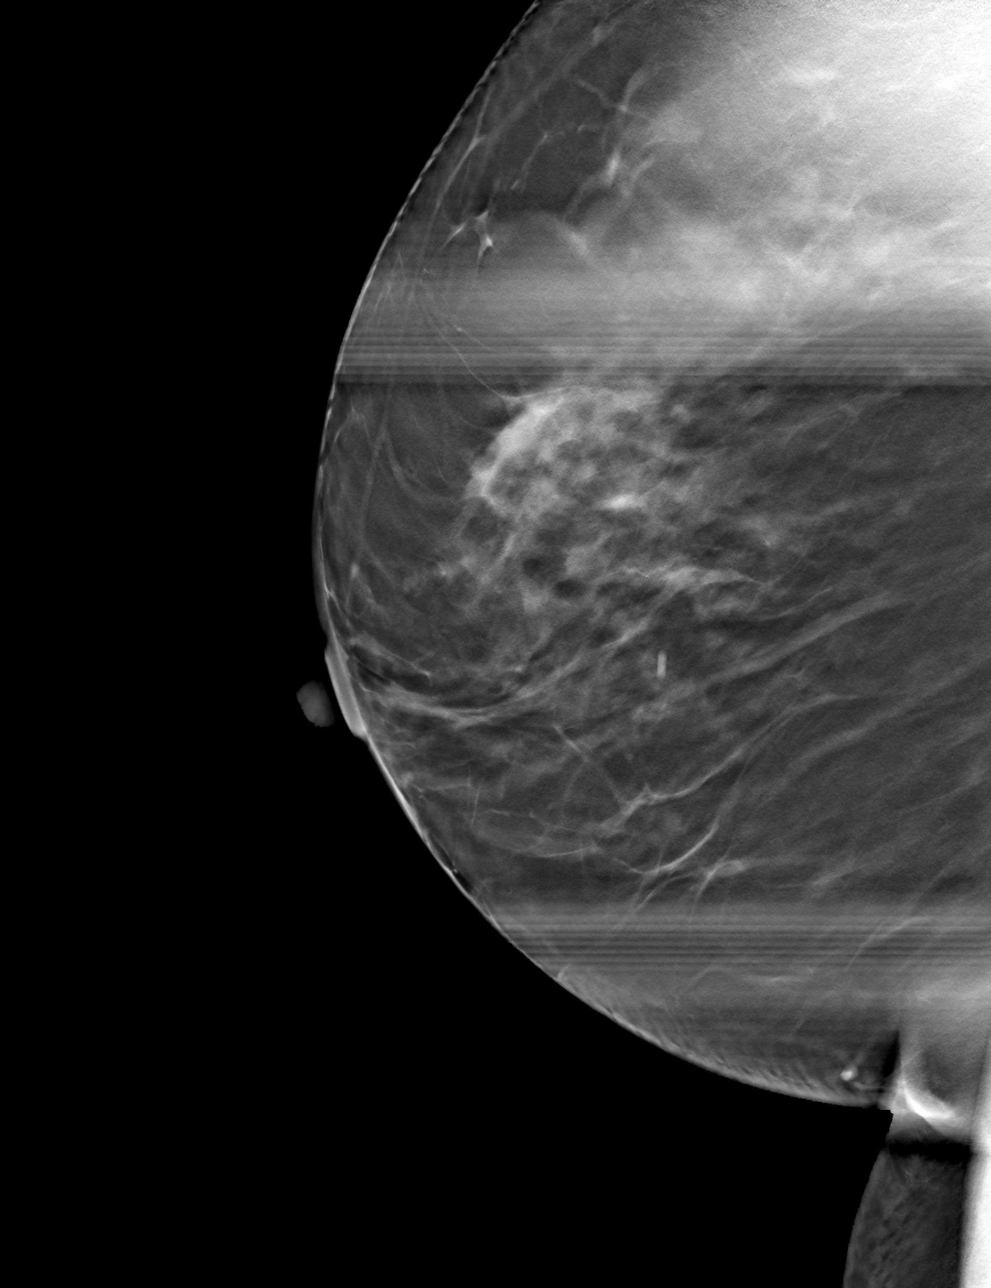

[4 of 12 positions shown; findings below may reference images not displayed]

ACR Breast Density Category c: The breast tissue is heterogeneously
dense, which may obscure small masses.
FINDINGS: Spot tomosynthesis views of the right breast demonstrate interval
resolution of the possible distortion in the right breast. No
persistent areas distortion, suspicious calcifications or masses are
identified in the right breast.

Mammographic images were processed with CAD.
IMPRESSION: Resolution of the possible area of distortion in the right breast
with additional diagnostic views. No mammographic evidence of
malignancy.

RECOMMENDATION:
Screening mammogram in one year.(Code:M7-7-NGX)

I have discussed the findings and recommendations with the patient.
Results were also provided in writing at the conclusion of the
visit. If applicable, a reminder letter will be sent to the patient
regarding the next appointment.

BI-RADS CATEGORY  1: Negative.

## 2016-08-19 DIAGNOSIS — R102 Pelvic and perineal pain: Secondary | ICD-10-CM | POA: Diagnosis not present

## 2016-08-19 DIAGNOSIS — N760 Acute vaginitis: Secondary | ICD-10-CM | POA: Diagnosis not present

## 2016-09-22 DIAGNOSIS — N939 Abnormal uterine and vaginal bleeding, unspecified: Secondary | ICD-10-CM | POA: Diagnosis not present

## 2016-09-22 DIAGNOSIS — N898 Other specified noninflammatory disorders of vagina: Secondary | ICD-10-CM | POA: Diagnosis not present

## 2016-09-29 DIAGNOSIS — H6091 Unspecified otitis externa, right ear: Secondary | ICD-10-CM | POA: Diagnosis not present

## 2016-09-29 DIAGNOSIS — H6010 Cellulitis of external ear, unspecified ear: Secondary | ICD-10-CM | POA: Diagnosis not present

## 2016-10-14 NOTE — H&P (Signed)
Angela Mays  DICTATION # [id number] CSN# [id number]   Margarette Asal, MD 10/14/2016 10:04 AM

## 2016-10-15 NOTE — H&P (Signed)
NAMEMENNIE, MADEN                ACCOUNT NO.:  [id number]  MEDICAL RECORD NO.:  [id number]  LOCATION:                                 FACILITY:  PHYSICIAN:  Ralene Bathe. Matthew Saras, M.D.DATE OF BIRTH:  03-24-1951  DATE OF ADMISSION: DATE OF DISCHARGE:                             HISTORY & PHYSICAL   CHIEF COMPLAINT:  Postmenopausal bleeding.  HISTORY OF PRESENT ILLNESS:  A 65 year old, G2, P2, postmenopausal patient, who was evaluated in August 2017, complaining of pelvic pain and some vaginal discharge with spotting with a slight odor noted.  SHD was scheduled in the office in October 2017, which showed some thickened endometrium and saline was infused.  She was noted to have 3.8 cm mass within the endometrial cavity.  It is hard to tell if that was a large polyp or fibroid.  Presents at this time for D and C, hysteroscopy with MyoSure resection.  This procedure including specific risks related to bleeding, infection, adjacent organ injury, the possible need for additional surgery, all reviewed with her which she understands and accepts.  PAST MEDICAL HISTORY:  ALLERGIES:  None.  CURRENT MEDICATIONS:  Metformin.  SURGICAL HISTORY:  Two vaginal deliveries, tubal ligation.  FAMILY HISTORY:  Significant for heart disease, hepatitis, anemia, neurologic disease, hypertension, cancer; breast, uterine, and colon cancer.  SOCIAL HISTORY:  Denies alcohol, tobacco, or drug use.  She is widowed.  PHYSICAL EXAMINATION:  VITAL SIGNS:  Temp 98.2, blood pressure 130/78. HEENT:  Unremarkable. NECK:  Supple without masses. LUNGS:  Clear. CARDIOVASCULAR:  Regular rate and rhythm without murmurs, rubs, gallops noted. BREASTS:  Without masses. ABDOMEN:  Soft, flat, nontender. GU:  Vulva, vagina, cervix normal.  Pap in September, 2016 was negative. Uterus normal size, mid position.  Adnexa negative. EXTREMITIES:  Unremarkable. NEUROLOGIC:  Unremarkable.  IMPRESSION:  Postmenopausal  bleeding with endometrial cavity, mass, polyp versus fibroid.  PLAN:  D and C, hysteroscopy with MyoSure.  Procedure and risks reviewed as above.     Brighton Pilley M. Matthew Saras, M.D.   ______________________________ Ralene Bathe. Matthew Saras, M.D.    [facility]/MEDQ  D:  10/14/2016  T:  10/15/2016  Job:  [id number]

## 2016-10-20 NOTE — Patient Instructions (Signed)
Your procedure is scheduled on:  Tuesday, Nov. 14, 2017  Enter through the [employer] [facility] at:  11:30 AM  Pick up the phone at the desk and dial [phone].  Call this number if you have problems the morning of surgery: [phone].  Remember: Do NOT eat food:  After Midnight Monday, Nov. 13, 2017  Do NOT drink clear liquids after:  9:00 AM day of surgery  Take these medicines the morning of surgery with a SIP OF WATER:  None  Do NOT take evening dose of Metformin the evening before surgery   Stop ALL herbal medications at this time   Do NOT wear jewelry (body piercing), metal hair clips/bobby pins, make-up, or nail polish. Do NOT wear lotions, powders, or perfumes.  You may wear deodorant. Do NOT shave for 48 hours prior to surgery. Do NOT bring valuables to the hospital. Contacts, dentures, or bridgework may not be worn into surgery.  Have a responsible adult drive you home and stay with you for 24 hours after your procedure

## 2016-10-21 ENCOUNTER — Encounter (HOSPITAL_COMMUNITY)
Admission: RE | Admit: 2016-10-21 | Discharge: 2016-10-21 | Disposition: A | Discharge status: Home / Self Care | Payer: Medicare Other | Source: Ambulatory Visit | Attending: Obstetrics and Gynecology | Admitting: Obstetrics and Gynecology

## 2016-10-21 ENCOUNTER — Other Ambulatory Visit (HOSPITAL_COMMUNITY): Payer: Medicare Other

## 2016-10-21 ENCOUNTER — Encounter (HOSPITAL_COMMUNITY): Payer: Self-pay

## 2016-10-21 DIAGNOSIS — Z01818 Encounter for other preprocedural examination: Secondary | ICD-10-CM | POA: Diagnosis not present

## 2016-10-21 DIAGNOSIS — R21 Rash and other nonspecific skin eruption: Secondary | ICD-10-CM

## 2016-10-21 HISTORY — DX: Cramp and spasm: R25.2

## 2016-10-21 HISTORY — DX: Rash and other nonspecific skin eruption: R21

## 2016-10-21 HISTORY — DX: Pneumonia, unspecified organism: J18.9

## 2016-10-21 HISTORY — DX: Type 2 diabetes mellitus without complications: E11.9

## 2016-10-21 LAB — CBC
HEMATOCRIT: 38.7 % (ref 36.0–46.0)
HEMOGLOBIN: 13.2 g/dL (ref 12.0–15.0)
MCH: 28.4 pg (ref 26.0–34.0)
MCHC: 34.1 g/dL (ref 30.0–36.0)
MCV: 83.4 fL (ref 78.0–100.0)
Platelets: 175 10*3/uL (ref 150–400)
RBC: 4.64 MIL/uL (ref 3.87–5.11)
RDW: 14.1 % (ref 11.5–15.5)
WBC: 8.8 10*3/uL (ref 4.0–10.5)

## 2016-10-21 LAB — BASIC METABOLIC PANEL
ANION GAP: 6 (ref 5–15)
BUN: 15 mg/dL (ref 6–20)
CO2: 29 mmol/L (ref 22–32)
Calcium: 9.1 mg/dL (ref 8.9–10.3)
Chloride: 102 mmol/L (ref 101–111)
Creatinine, Ser: 0.73 mg/dL (ref 0.44–1.00)
GFR calc Af Amer: >60 mL/min (ref >60)
Glucose, Bld: 293 mg/dL — ABNORMAL HIGH (ref 65–99)
POTASSIUM: 4.2 mmol/L (ref 3.5–5.1)
Sodium: 137 mmol/L (ref 135–145)

## 2016-10-21 NOTE — Progress Notes (Addendum)
Left a message for Heather about elevated glucose.  Dr. Jillyn Hidden made aware.

## 2016-10-22 NOTE — Pre-Procedure Instructions (Signed)
Angela Mays returned the message to verify she recevied the elevated glucose results for Angela Mays.  Angela Mays stated she will have Angela Mays seen by her PCP prior to surgery.

## 2016-10-26 ENCOUNTER — Encounter: Payer: Self-pay | Admitting: Gastroenterology

## 2016-10-26 DIAGNOSIS — Z7984 Long term (current) use of oral hypoglycemic drugs: Secondary | ICD-10-CM | POA: Diagnosis not present

## 2016-10-26 DIAGNOSIS — E1165 Type 2 diabetes mellitus with hyperglycemia: Secondary | ICD-10-CM | POA: Diagnosis not present

## 2016-10-27 DIAGNOSIS — N950 Postmenopausal bleeding: Secondary | ICD-10-CM | POA: Diagnosis not present

## 2016-10-28 NOTE — Pre-Procedure Instructions (Signed)
[name] was seen on 10/26/2016 by her PCP Dr. Zadie Rhine at [place], Hbg A1C 9.8.  [name] was retarted on diabetic medication.  Dr. Seward Speck updated, no new orders at this time.

## 2016-11-01 MED ORDER — DEXTROSE 5 % IV SOLN
2.0000 g | INTRAVENOUS | Status: AC
  Administered 2016-11-02: 2 g via INTRAVENOUS
  Filled 2016-11-01: qty 2

## 2016-11-02 ENCOUNTER — Encounter (HOSPITAL_COMMUNITY): Payer: Self-pay | Admitting: *Deleted

## 2016-11-02 ENCOUNTER — Encounter (HOSPITAL_COMMUNITY)
Admission: RE | Disposition: A | Discharge status: Home / Self Care | Payer: Self-pay | Source: Ambulatory Visit | Attending: Obstetrics and Gynecology

## 2016-11-02 ENCOUNTER — Ambulatory Visit (HOSPITAL_COMMUNITY): Payer: Medicare Other | Admitting: Anesthesiology

## 2016-11-02 ENCOUNTER — Ambulatory Visit (HOSPITAL_COMMUNITY)
Admission: RE | Admit: 2016-11-02 | Discharge: 2016-11-02 | Disposition: A | Discharge status: Home / Self Care | Payer: Medicare Other | Source: Ambulatory Visit | Attending: Obstetrics and Gynecology | Admitting: Obstetrics and Gynecology

## 2016-11-02 DIAGNOSIS — E1165 Type 2 diabetes mellitus with hyperglycemia: Secondary | ICD-10-CM | POA: Insufficient documentation

## 2016-11-02 DIAGNOSIS — Z7984 Long term (current) use of oral hypoglycemic drugs: Secondary | ICD-10-CM | POA: Diagnosis not present

## 2016-11-02 DIAGNOSIS — Z888 Allergy status to other drugs, medicaments and biological substances status: Secondary | ICD-10-CM | POA: Diagnosis not present

## 2016-11-02 DIAGNOSIS — N950 Postmenopausal bleeding: Secondary | ICD-10-CM | POA: Diagnosis not present

## 2016-11-02 DIAGNOSIS — C541 Malignant neoplasm of endometrium: Secondary | ICD-10-CM | POA: Insufficient documentation

## 2016-11-02 DIAGNOSIS — N840 Polyp of corpus uteri: Secondary | ICD-10-CM | POA: Diagnosis not present

## 2016-11-02 DIAGNOSIS — E669 Obesity, unspecified: Secondary | ICD-10-CM | POA: Insufficient documentation

## 2016-11-02 DIAGNOSIS — Z6835 Body mass index (BMI) 35.0-35.9, adult: Secondary | ICD-10-CM | POA: Diagnosis not present

## 2016-11-02 HISTORY — PX: DILATATION & CURETTAGE/HYSTEROSCOPY WITH MYOSURE: SHX6511

## 2016-11-02 LAB — TYPE AND SCREEN
ABO/RH(D): A POS
Antibody Screen: NEGATIVE

## 2016-11-02 LAB — GLUCOSE, CAPILLARY
GLUCOSE-CAPILLARY: 108 mg/dL — AB (ref 65–99)
GLUCOSE-CAPILLARY: 91 mg/dL (ref 65–99)

## 2016-11-02 LAB — ABO/RH: ABO/RH(D): A POS

## 2016-11-02 SURGERY — DILATATION & CURETTAGE/HYSTEROSCOPY WITH MYOSURE
Anesthesia: General

## 2016-11-02 MED ORDER — LIDOCAINE HCL (CARDIAC) 20 MG/ML IV SOLN
INTRAVENOUS | Status: DC | PRN
  Administered 2016-11-02: 80 mg via INTRAVENOUS

## 2016-11-02 MED ORDER — ONDANSETRON HCL 4 MG/2ML IJ SOLN
INTRAMUSCULAR | Status: AC
  Filled 2016-11-02: qty 2

## 2016-11-02 MED ORDER — ONDANSETRON HCL 4 MG/2ML IJ SOLN
4.0000 mg | Freq: Once | INTRAMUSCULAR | Status: AC
  Administered 2016-11-02: 4 mg via INTRAVENOUS

## 2016-11-02 MED ORDER — FENTANYL CITRATE (PF) 100 MCG/2ML IJ SOLN
25.0000 ug | INTRAMUSCULAR | Status: DC | PRN
  Administered 2016-11-02: 50 ug via INTRAVENOUS

## 2016-11-02 MED ORDER — PROPOFOL 10 MG/ML IV BOLUS
INTRAVENOUS | Status: AC
  Filled 2016-11-02: qty 20

## 2016-11-02 MED ORDER — MEPERIDINE HCL 25 MG/ML IJ SOLN: 6.2500 mg | INTRAMUSCULAR | Status: DC | PRN

## 2016-11-02 MED ORDER — KETOROLAC TROMETHAMINE 15 MG/ML IJ SOLN
INTRAMUSCULAR | Status: DC | PRN
  Administered 2016-11-02: 15 mg via INTRAVENOUS

## 2016-11-02 MED ORDER — PROPOFOL 10 MG/ML IV BOLUS
INTRAVENOUS | Status: DC | PRN
  Administered 2016-11-02: 180 mg via INTRAVENOUS
  Administered 2016-11-02: 20 mg via INTRAVENOUS

## 2016-11-02 MED ORDER — LACTATED RINGERS IV SOLN
INTRAVENOUS | Status: DC
  Administered 2016-11-02: 12:00:00 via INTRAVENOUS

## 2016-11-02 MED ORDER — FENTANYL CITRATE (PF) 100 MCG/2ML IJ SOLN
INTRAMUSCULAR | Status: DC | PRN
  Administered 2016-11-02: 100 ug via INTRAVENOUS

## 2016-11-02 MED ORDER — MIDAZOLAM HCL 2 MG/2ML IJ SOLN
INTRAMUSCULAR | Status: AC
  Filled 2016-11-02: qty 2

## 2016-11-02 MED ORDER — ONDANSETRON HCL 4 MG/2ML IJ SOLN
INTRAMUSCULAR | Status: DC | PRN
  Administered 2016-11-02: 4 mg via INTRAVENOUS

## 2016-11-02 MED ORDER — LIDOCAINE HCL 1 % IJ SOLN
INTRAMUSCULAR | Status: AC
  Filled 2016-11-02: qty 20

## 2016-11-02 MED ORDER — FENTANYL CITRATE (PF) 100 MCG/2ML IJ SOLN
INTRAMUSCULAR | Status: AC
  Filled 2016-11-02: qty 2

## 2016-11-02 MED ORDER — FENTANYL CITRATE (PF) 250 MCG/5ML IJ SOLN
INTRAMUSCULAR | Status: AC
  Filled 2016-11-02: qty 5

## 2016-11-02 MED ORDER — KETOROLAC TROMETHAMINE 30 MG/ML IJ SOLN
INTRAMUSCULAR | Status: AC
  Filled 2016-11-02: qty 1

## 2016-11-02 MED ORDER — LIDOCAINE HCL (CARDIAC) 20 MG/ML IV SOLN
INTRAVENOUS | Status: AC
  Filled 2016-11-02: qty 5

## 2016-11-02 MED ORDER — ONDANSETRON HCL 4 MG/2ML IJ SOLN
4.0000 mg | Freq: Once | INTRAMUSCULAR | Status: AC | PRN
  Administered 2016-11-02: 4 mg via INTRAVENOUS

## 2016-11-02 MED ORDER — LIDOCAINE HCL 1 % IJ SOLN
INTRAMUSCULAR | Status: DC | PRN
  Administered 2016-11-02: 9 mL

## 2016-11-02 MED ORDER — SODIUM CHLORIDE 0.9 % IR SOLN
Status: DC | PRN
  Administered 2016-11-02: 3000 mL

## 2016-11-02 SURGICAL SUPPLY — 17 items
CATH ROBINSON RED A/P 16FR (CATHETERS) ×2 IMPLANT
CLOTH BEACON ORANGE TIMEOUT ST (SAFETY) ×2 IMPLANT
CONTAINER PREFILL 10% NBF 60ML (FORM) ×4 IMPLANT
DEVICE MYOSURE LITE (MISCELLANEOUS) ×1 IMPLANT
DEVICE MYOSURE REACH (MISCELLANEOUS) IMPLANT
FILTER ARTHROSCOPY CONVERTOR (FILTER) ×2 IMPLANT
GLOVE BIO SURGEON STRL SZ7 (GLOVE) ×4 IMPLANT
GLOVE BIOGEL PI IND STRL 7.0 (GLOVE) ×1 IMPLANT
GLOVE BIOGEL PI INDICATOR 7.0 (GLOVE) ×1
GOWN STRL REUS W/TWL LRG LVL3 (GOWN DISPOSABLE) ×4 IMPLANT
PACK VAGINAL MINOR WOMEN LF (CUSTOM PROCEDURE TRAY) ×2 IMPLANT
PAD OB MATERNITY 4.3X12.25 (PERSONAL CARE ITEMS) ×2 IMPLANT
SEAL ROD LENS SCOPE MYOSURE (ABLATOR) ×2 IMPLANT
TOWEL OR 17X24 6PK STRL BLUE (TOWEL DISPOSABLE) ×4 IMPLANT
TUBING AQUILEX INFLOW (TUBING) ×2 IMPLANT
TUBING AQUILEX OUTFLOW (TUBING) ×2 IMPLANT
WATER STERILE IRR 1000ML POUR (IV SOLUTION) ×2 IMPLANT

## 2016-11-02 NOTE — Progress Notes (Signed)
The patient was re-examined with no change in status 

## 2016-11-02 NOTE — Anesthesia Preprocedure Evaluation (Signed)
Anesthesia Evaluation  Patient identified by MRN, date of birth, ID band Patient awake    Reviewed: Allergy & Precautions, H&P , NPO status , Patient's Chart, lab work & pertinent test results, reviewed documented beta blocker date and time   Airway Mallampati: II  TM Distance: >3 FB Neck ROM: full    Dental no notable dental hx. (+) Teeth Intact   Pulmonary    Pulmonary exam normal        Cardiovascular negative cardio ROS Normal cardiovascular exam     Neuro/Psych negative neurological ROS  negative psych ROS   GI/Hepatic negative GI ROS, Neg liver ROS,   Endo/Other  diabetes, Poorly Controlled, Type 2  Renal/GU negative Renal ROS     Musculoskeletal   Abdominal (+) + obese,   Peds  Hematology negative hematology ROS (+)   Anesthesia Other Findings   Reproductive/Obstetrics negative OB ROS                             Anesthesia Physical Anesthesia Plan  ASA: II  Anesthesia Plan: General   Post-op Pain Management:    Induction: Intravenous  Airway Management Planned: LMA  Additional Equipment:   Intra-op Plan:   Post-operative Plan:   Informed Consent: I have reviewed the patients History and Physical, chart, labs and discussed the procedure including the risks, benefits and alternatives for the proposed anesthesia with the patient or authorized representative who has indicated his/her understanding and acceptance.     Plan Discussed with: CRNA and Surgeon  Anesthesia Plan Comments:         Anesthesia Quick Evaluation

## 2016-11-02 NOTE — Discharge Instructions (Addendum)
DISCHARGE INSTRUCTIONS: HYSTEROSCOPY / ENDOMETRIAL ABLATION The following instructions have been prepared to help you care for yourself upon your return home.   May take Ibuprofen after: 6:45 PM TODAY  May take stool softner while taking narcotic pain medication to prevent constipation.  Drink plenty of water.  Personal hygiene:  Use sanitary pads for vaginal drainage, not tampons.  Shower the day after your procedure.  NO tub baths, pools or Jacuzzis for 2-3 weeks.  Wipe front to back after using the bathroom.  Activity and limitations:  Do NOT drive or operate any equipment for 24 hours. The effects of anesthesia are still present and drowsiness may result.  Do NOT rest in bed all day.  Walking is encouraged.  Walk up and down stairs slowly.  You may resume your normal activity in one to two days or as indicated by your physician. Sexual activity: NO intercourse for at least 2 weeks after the procedure, or as indicated by your Doctor.  Diet: Eat a light meal as desired this evening. You may resume your usual diet tomorrow.  Return to Work: You may resume your work activities in one to two days or as indicated by Marine scientist.  What to expect after your surgery: Expect to have vaginal bleeding/discharge for 2-3 days and spotting for up to 10 days. It is not unusual to have soreness for up to 1-2 weeks. You may have a slight burning sensation when you urinate for the first day. Mild cramps may continue for a couple of days. You may have a regular period in 2-6 weeks.  Call your doctor for any of the following:  Excessive vaginal bleeding or clotting, saturating and changing one pad every hour.  Inability to urinate 6 hours after discharge from hospital.  Pain not relieved by pain medication.  Fever of 100.4 F or greater.  Unusual vaginal discharge or odor.  Return to office _________________Call for an appointment ___________________ Patients signature:  ______________________ Nurses signature ________________________  Post Anesthesia Care Unit [phone] Post Anesthesia Home Care Instructions  Activity: Get plenty of rest for the remainder of the day. A responsible adult should stay with you for 24 hours following the procedure.  For the next 24 hours, DO NOT: -Drive a car -Paediatric nurse -Drink alcoholic beverages -Take any medication unless instructed by your physician -Make any legal decisions or sign important papers.  Meals: Start with liquid foods such as gelatin or soup. Progress to regular foods as tolerated. Avoid greasy, spicy, heavy foods. If nausea and/or vomiting occur, drink only clear liquids until the nausea and/or vomiting subsides. Call your physician if vomiting continues.  Special Instructions/Symptoms: Your throat may feel dry or sore from the anesthesia or the breathing tube placed in your throat during surgery. If this causes discomfort, gargle with warm salt water. The discomfort should disappear within 24 hours.

## 2016-11-02 NOTE — Anesthesia Procedure Notes (Signed)
Procedure Name: LMA Insertion Date/Time: 11/02/2016 1:20 PM Performed by: Casimer Lanius A Pre-anesthesia Checklist: Patient being monitored, Patient identified, Emergency Drugs available and Suction available Patient Re-evaluated:Patient Re-evaluated prior to inductionOxygen Delivery Method: Circle system utilized Preoxygenation: Pre-oxygenation with 100% oxygen Intubation Type: IV induction and Inhalational induction Ventilation: Mask ventilation without difficulty LMA: LMA inserted LMA Size: 4.0 Number of attempts: 1 Dental Injury: Teeth and Oropharynx as per pre-operative assessment

## 2016-11-02 NOTE — Op Note (Signed)
Preoperative diagnosis: Postmenopausal bleeding, endometrial polyp  Postoperative diagnosis: Same  Procedure: D&C, hysteroscopy with Myosure resection of endometrial polyp  Surgeon: Matthew Saras  Anesthesia: Gen.  EBL: 50 cc  Specimens removed: Endometrial curettings, to pathology  Procedure and findings:  Patient taken the operating room after an adequate adequate level of general anesthesia was obtained the patient's legs in stirrups the perineum and vagina and cervix were prepped and draped. The bladder was drained with a pediatric Foley appropriate timeouts were taken at that point. EUA revealed that the uterus was small midposition adnexa negative. Speculum was positioned paracervical block created by infiltrating at 3 and 9:00 submucosally, 5-7 cc 1% Xylocaine plain at each site after negative aspiration. Uterus sounded 8 cm progressively dilated to a 27 Pratt, continuous flow hysteroscopy was carried out large amount of tissue was noted obscuring the view, therefore sharp curettage was carried out moderate amount of tissue was removed. The scope was reinserted the cavity was irrigated there were several remaining polypoid areas and some generalized endometrial buildup that required Myosure resection. The Myosure light was placed and the polypoid tissue and excessive endometrial buildup was removed there was minimal bleeding the cavity looked clean at the end of the procedure that specimen was submitted along with endometrial curettings. She tolerated this procedure well went to recovery room in good condition.  Dictated with HarveyD.

## 2016-11-02 NOTE — Transfer of Care (Signed)
Immediate Anesthesia Transfer of Care Note  Patient: Angela Mays  Procedure(s) Performed: Procedure(s): DILATATION & CURETTAGE/HYSTEROSCOPY WITH MYOSURE (N/A)  Patient Location: PACU  Anesthesia Type:General  Level of Consciousness: sedated  Airway & Oxygen Therapy: Patient Spontanous Breathing and Patient connected to nasal cannula oxygen  Post-op Assessment: Report given to RN  Post vital signs: Reviewed and stable  Last Vitals:  Vitals:   11/02/16 1147  BP: (!) 153/92  Pulse: 60  Resp: 18  Temp: 36.6 C    Last Pain:  Vitals:   11/02/16 1147  TempSrc: Oral      Patients Stated Pain Goal: 5 ([id number])  Complications: No apparent anesthesia complications

## 2016-11-03 ENCOUNTER — Encounter (HOSPITAL_COMMUNITY): Payer: Self-pay | Admitting: Obstetrics and Gynecology

## 2016-11-03 NOTE — Anesthesia Postprocedure Evaluation (Signed)
Anesthesia Post Note  Patient: SAKENA WEHRS  Procedure(s) Performed: Procedure(s) (LRB): DILATATION & CURETTAGE/HYSTEROSCOPY WITH MYOSURE (N/A)  Patient location during evaluation: PACU Anesthesia Type: General Level of consciousness: awake and alert Pain management: pain level controlled Vital Signs Assessment: post-procedure vital signs reviewed and stable Respiratory status: spontaneous breathing, nonlabored ventilation, respiratory function stable and patient connected to nasal cannula oxygen Cardiovascular status: blood pressure returned to baseline and stable Postop Assessment: no signs of nausea or vomiting Anesthetic complications: no    Last Vitals:  Vitals:   11/02/16 1530 11/02/16 1628  BP: 140/76 (!) 146/77  Pulse: (!) 56 (!) 56  Resp: 16 16  Temp: 36.6 C 36.5 C    Last Pain:  Vitals:   11/02/16 1530  TempSrc:   PainSc: 0-No pain                 Olegario Emberson EDWARD

## 2016-11-22 ENCOUNTER — Encounter: Payer: Self-pay | Admitting: Gynecologic Oncology

## 2016-11-22 ENCOUNTER — Other Ambulatory Visit (HOSPITAL_BASED_OUTPATIENT_CLINIC_OR_DEPARTMENT_OTHER): Payer: Medicare Other

## 2016-11-22 ENCOUNTER — Ambulatory Visit: Payer: Medicare Other | Attending: Gynecologic Oncology | Admitting: Gynecologic Oncology

## 2016-11-22 VITALS — BP 161/77 | HR 68 | Temp 97.7°F | Resp 17 | Ht 64.0 in | Wt 202.0 lb

## 2016-11-22 DIAGNOSIS — Z800 Family history of malignant neoplasm of digestive organs: Secondary | ICD-10-CM | POA: Insufficient documentation

## 2016-11-22 DIAGNOSIS — Z9114 Patient's other noncompliance with medication regimen: Secondary | ICD-10-CM | POA: Insufficient documentation

## 2016-11-22 DIAGNOSIS — E1165 Type 2 diabetes mellitus with hyperglycemia: Secondary | ICD-10-CM | POA: Diagnosis not present

## 2016-11-22 DIAGNOSIS — E119 Type 2 diabetes mellitus without complications: Secondary | ICD-10-CM

## 2016-11-22 DIAGNOSIS — C541 Malignant neoplasm of endometrium: Secondary | ICD-10-CM | POA: Diagnosis not present

## 2016-11-22 DIAGNOSIS — N950 Postmenopausal bleeding: Secondary | ICD-10-CM | POA: Diagnosis not present

## 2016-11-22 DIAGNOSIS — Z7984 Long term (current) use of oral hypoglycemic drugs: Secondary | ICD-10-CM | POA: Diagnosis not present

## 2016-11-22 DIAGNOSIS — Z803 Family history of malignant neoplasm of breast: Secondary | ICD-10-CM | POA: Diagnosis not present

## 2016-11-22 DIAGNOSIS — Z9851 Tubal ligation status: Secondary | ICD-10-CM | POA: Insufficient documentation

## 2016-11-22 DIAGNOSIS — Z888 Allergy status to other drugs, medicaments and biological substances status: Secondary | ICD-10-CM | POA: Diagnosis not present

## 2016-11-22 DIAGNOSIS — Z6841 Body Mass Index (BMI) 40.0 and over, adult: Secondary | ICD-10-CM | POA: Insufficient documentation

## 2016-11-22 MED ORDER — MEGESTROL ACETATE 40 MG PO TABS: 40.0000 mg | ORAL_TABLET | Freq: Two times a day (BID) | ORAL | 0 refills | Status: DC

## 2016-11-22 NOTE — Patient Instructions (Signed)
Plan for Robotic Total abdominal hysterectomy with bilateral salpingo-oophorectomy and sentinel node biopsy.              Preparing for your Surgery  Plan for surgery on January 04, 2017 with Dr. Everitt Amber.  Pre-operative Testing -You will receive a phone call from presurgical testing at [facility] to arrange for a pre-operative testing appointment before your surgery.  This appointment normally occurs one to two weeks before your scheduled surgery.   -Bring your insurance card, copy of an advanced directive if applicable, medication list  -At that visit, you will be asked to sign a consent for a possible blood transfusion in case a transfusion becomes necessary during surgery.  The need for a blood transfusion is rare but having consent is a necessary part of your care.     -You should not be taking blood thinners or aspirin at least ten days prior to surgery unless instructed by your surgeon.  Day Before Surgery at [place] will be asked to take in a light diet the day before surgery.  Avoid carbonated beverages.  You will be advised to have nothing to eat or drink after midnight the evening before.     Eat a light diet the day before surgery.  Examples including soups, broths,  toast, yogurt, mashed potatoes.  Things to avoid include carbonated beverages  (fizzy beverages), raw fruits and raw vegetables, or beans.    If your bowels are filled with gas, your surgeon will have difficulty  visualizing your pelvic organs which increases your surgical risks.  Your role in recovery Your role is to become active as soon as directed by your doctor, while still giving yourself time to heal.  Rest when you feel tired. You will be asked to do the following in order to speed your recovery:  - Cough and breathe deeply. This helps toclear and expand your lungs and can prevent pneumonia. You may be given a spirometer to practice deep breathing. A staff member will show you how to use the  spirometer. - Do mild physical activity. Walking or moving your legs help your circulation and body functions return to normal. A staff member will help you when you try to walk and will provide you with simple exercises. Do not try to get up or walk alone the first time. - Actively manage your pain. Managing your pain lets you move in comfort. We will ask you to rate your pain on a scale of zero to 10. It is your responsibility to tell your doctor or nurse where and how much you hurt so your pain can be treated.  Special Considerations -If you are diabetic, you may be placed on insulin after surgery to have closer control over your blood sugars to promote healing and recovery.  This does not mean that you will be discharged on insulin.  If applicable, your oral antidiabetics will be resumed when you are tolerating a solid diet.  -Your final pathology results from surgery should be available by the Friday after surgery and the results will be relayed to you when available.  Eat a light diet the day before surgery.  Examples including soups, broths, toast, yogurt, mashed potatoes.  Things to avoid include carbonated beverages (fizzy beverages), raw fruits and raw vegetables, or beans.   If your bowels are filled with gas, your surgeon will have difficulty visualizing your pelvic organs which increases your surgical risks. Blood Transfusion Information WHAT IS A BLOOD TRANSFUSION? A  transfusion is the replacement of blood or some of its parts. Blood is made up of multiple cells which provide different functions.  Red blood cells carry oxygen and are used for blood loss replacement.  White blood cells fight against infection.  Platelets control bleeding.  Plasma helps clot blood.  Other blood products are available for specialized needs, such as hemophilia or other clotting disorders. BEFORE THE TRANSFUSION  Who gives blood for transfusions?   You may be able to donate blood to be used at a  later date on yourself (autologous donation).  Relatives can be asked to donate blood. This is generally not any safer than if you have received blood from a stranger. The same precautions are taken to ensure safety when a relative's blood is donated.  Healthy volunteers who are fully evaluated to make sure their blood is safe. This is blood bank blood. Transfusion therapy is the safest it has ever been in the practice of medicine. Before blood is taken from a donor, a complete history is taken to make sure that person has no history of diseases nor engages in risky social behavior (examples are intravenous drug use or sexual activity with multiple partners). The donor's travel history is screened to minimize risk of transmitting infections, such as malaria. The donated blood is tested for signs of infectious diseases, such as HIV and hepatitis. The blood is then tested to be sure it is compatible with you in order to minimize the chance of a transfusion reaction. If you or a relative donates blood, this is often done in anticipation of surgery and is not appropriate for emergency situations. It takes many days to process the donated blood. RISKS AND COMPLICATIONS Although transfusion therapy is very safe and saves many lives, the main dangers of transfusion include:   Getting an infectious disease.  Developing a transfusion reaction. This is an allergic reaction to something in the blood you were given. Every precaution is taken to prevent this. The decision to have a blood transfusion has been considered carefully by your caregiver before blood is given. Blood is not given unless the benefits outweigh the risks.

## 2016-11-22 NOTE — Progress Notes (Signed)
Consult Note: Gyn-Onc  Consult was requested by Dr. Matthew Saras for the evaluation of Angela Mays 65 y.o. female  CC:  Chief Complaint  Patient presents with  . endometrial carcinoma    Assessment/Plan:  Angela Mays  is a 65 y.o.  year old with FIGO grade 1 endometrioid endometrial cancer, obesity and type II DM (poorly controlled).  A detailed discussion was held with the patient and her family with regard to to her endometrial cancer diagnosis. We discussed the standard management options for uterine cancer which includes surgery followed possibly by adjuvant therapy depending on the results of surgery. The options for surgical management include a hysterectomy and removal of the tubes and ovaries possibly with removal of pelvic and para-aortic lymph nodes.If feasible, a minimally invasive approach including a robotic hysterectomy or laparoscopic hysterectomy have benefits including shorter hospital stay, recovery time and better wound healing than with open surgery. The patient has been counseled about these surgical options and the risks of surgery in general including infection, bleeding, damage to surrounding structures (including bowel, bladder, ureters, nerves or vessels), and the postoperative risks of PE/ DVT, and lymphedema. I extensively reviewed the additional risks of robotic hysterectomy including possible need for conversion to open laparotomy.  I discussed positioning during surgery of trendelenberg and risks of minor facial swelling and care we take in preoperative positioning.  After counseling and consideration of her options, she desires to proceed with robotic assisted total hysterectomy with bilateral sapingo-oophorectomy and SLN biopsy.   She will be seen by anesthesia for preoperative clearance and discussion of postoperative pain management.  She was given the opportunity to ask questions, which were answered to her satisfaction, and she is agreement with the above  mentioned plan of care.  I discussed that she is at increased risk for complications due to her morbid obesity and diabetes. We will check HbA1C today. She is due to see her endocrinologist on the 18th to discuss optimization. I discussed the importance of compliance with meds perioperatively to reduce complication risk.  She requested a delay in the surgery until mid January. Given her low grade tumor I believe this is safe and reasonable. We will treat her with megace until that time.   HPI: Angela Mays is a very pleasant 65 year old woman who is seen in consultation at the request of Dr Matthew Saras for grade 1 endometrial cancer.  The patient reports postmenopausal bleeding that began in late September 2017. It was very light. She saw Dr Matthew Saras and on 09/22/16 she had a sonohysterogram which showed a uterus measuring 7.5x4.2x5.9cm with a 49mm endometrial stripe. She was taken to the OR on 11/02/16 and a hysteroscopy D&C was performed with myosure resection of the the polyp. It showed extensive FIGO grade 1 endometrial cancer.  The patient reports having a sister with breast cancer in there 30's and a paternal grandfather with colon cancer.  She has had only a prior tubal ligation.  She is morbidly obese. Her BMI is 40kg/m2.  She has poorly controlled DM secondary to admitted noncompliance with meds that give her diarrhea.   Current Meds:  Outpatient Encounter Prescriptions as of 11/22/2016  Medication Sig  . glipiZIDE (GLUCOTROL XL) 2.5 MG 24 hr tablet Take 2 mg by mouth daily with breakfast.  . metFORMIN (GLUCOPHAGE) 500 MG tablet Take by mouth 2 (two) times daily with a meal.  . augmented betamethasone dipropionate (DIPROLENE AF) 0.05 % cream Apply topically 2 (two) times daily. (Patient  not taking: Reported on 11/22/2016)  . meloxicam (MOBIC) 15 MG tablet Take 1 tablet (15 mg total) by mouth daily. (Patient not taking: Reported on 11/22/2016)   No facility-administered encounter  medications on file as of 11/22/2016.     Allergy:  Allergies  Allergen Reactions  . Flagyl [Metronidazole]     Social Hx:   Social History   Social History  . Marital status: Single    Spouse name: N/A  . Number of children: N/A  . Years of education: N/A   Occupational History  . Not on file.   Social History Main Topics  . Smoking status: Never Smoker  . Smokeless tobacco: Never Used  . Alcohol use Not on file  . Drug use: Unknown  . Sexual activity: Not on file   Other Topics Concern  . Not on file   Social History Narrative  . No narrative on file    Past Surgical Hx:  Past Surgical History:  Procedure Laterality Date  . COLONOSCOPY    . DILATATION & CURETTAGE/HYSTEROSCOPY WITH MYOSURE N/A 11/02/2016   Procedure: DILATATION & CURETTAGE/HYSTEROSCOPY WITH MYOSURE;  Surgeon: Molli Posey, MD;  Location: [facility];  Service: Gynecology;  Laterality: N/A;  . DILATION AND CURETTAGE OF UTERUS      Past Medical Hx:  Past Medical History:  Diagnosis Date  . Diabetes mellitus without complication ([place])   . Leg cramps    and feet  . Pneumonia   . Skin rash 10/21/2016    Past Gynecological History:  SVD x 2 No LMP recorded. Patient is postmenopausal.  Family Hx: History reviewed. No pertinent family history.  Review of Systems:  Constitutional  Feels well,    ENT Normal appearing ears and nares bilaterally Skin/Breast  No rash, sores, jaundice, itching, dryness Cardiovascular  No chest pain, shortness of breath, or edema  Pulmonary  No cough or wheeze.  Gastro Intestinal  No nausea, vomitting, or diarrhoea. No bright red blood per rectum, no abdominal pain, change in bowel movement, or constipation.  Genito Urinary  No frequency, urgency, dysuria, + postmenopausal bleeding Musculo Skeletal  No myalgia, arthralgia, joint swelling or pain  Neurologic  No weakness, numbness, change in gait,  Psychology  No depression, anxiety, insomnia.   Vitals:   Blood pressure (!) 161/77, pulse 68, temperature 97.7 F (36.5 C), temperature source Oral, resp. rate 17, height 5\' 4"  (1.626 m), weight 202 lb (91.6 kg), SpO2 99 %.  Physical Exam: WD in NAD Neck  Supple NROM, without any enlargements.  Lymph Node Survey No cervical supraclavicular or inguinal adenopathy Cardiovascular  Pulse normal rate, regularity and rhythm. S1 and S2 normal.  Lungs  Clear to auscultation bilateraly, without wheezes/crackles/rhonchi. Good air movement.  Skin  No rash/lesions/breakdown  Psychiatry  Alert and oriented to person, place, and time  Abdomen  Normoactive bowel sounds, abdomen soft, non-tender and obese without evidence of hernia.  Back No CVA tenderness Genito Urinary  Vulva/vagina: Normal external female genitalia.   No lesions. No discharge or bleeding.  Bladder/urethra:  No lesions or masses, well supported bladder  Vagina: normal  Cervix: Normal appearing, no lesions.  Uterus:  Small, mobile, no parametrial involvement or nodularity.  Adnexa: no palpable masses. Rectal  deferred Extremities  No bilateral cyanosis, clubbing or edema.   Donaciano Eva, MD  11/22/2016, 10:44 AM

## 2016-11-23 LAB — HEMOGLOBIN A1C
ESTIMATED AVERAGE GLUCOSE: 189 mg/dL
Hemoglobin A1c: 8.2 % — ABNORMAL HIGH (ref 4.8–5.6)

## 2016-11-24 ENCOUNTER — Telehealth: Payer: Self-pay

## 2016-11-24 NOTE — Telephone Encounter (Signed)
Pt informed of A1C HGB result of 8.2, will follow up with her endocrinologist

## 2016-12-06 ENCOUNTER — Encounter: Payer: Self-pay | Admitting: Internal Medicine

## 2016-12-06 ENCOUNTER — Ambulatory Visit (INDEPENDENT_AMBULATORY_CARE_PROVIDER_SITE_OTHER): Payer: Medicare Other | Admitting: Internal Medicine

## 2016-12-06 VITALS — BP 112/68 | HR 81 | Ht 64.0 in | Wt 201.0 lb

## 2016-12-06 DIAGNOSIS — E1165 Type 2 diabetes mellitus with hyperglycemia: Secondary | ICD-10-CM | POA: Insufficient documentation

## 2016-12-06 MED ORDER — METFORMIN HCL ER 500 MG PO TB24: 500.0000 mg | ORAL_TABLET | Freq: Every day | ORAL | 3 refills | Status: DC

## 2016-12-06 MED ORDER — GLIMEPIRIDE 2 MG PO TABS: 2.0000 mg | ORAL_TABLET | Freq: Two times a day (BID) | ORAL | 3 refills | Status: DC

## 2016-12-06 NOTE — Patient Instructions (Addendum)
Please change metformin to the XR formulation and increase to 1000 mg twice a day (right after meals).  Please move glimepiride 2 mg to before breakfast and dinner (15-30 min before meals). If you have a particularly larger meal, you may take 2 tablets before that meal.  Please let me know if the sugars are consistently <80 or >200.  Please return in 1.5 months with your sugar log.   PATIENT INSTRUCTIONS FOR TYPE 2 DIABETES:  **Please join MyChart!** - see attached instructions about how to join if you have not done so already.  DIET AND EXERCISE Diet and exercise is an important part of diabetic treatment.  We recommended aerobic exercise in the form of brisk walking (working between 40-60% of maximal aerobic capacity, similar to brisk walking) for 150 minutes per week (such as 30 minutes five days per week) along with 3 times per week performing 'resistance' training (using various gauge rubber tubes with handles) 5-10 exercises involving the major muscle groups (upper body, lower body and core) performing 10-15 repetitions (or near fatigue) each exercise. Start at half the above goal but build slowly to reach the above goals. If limited by weight, joint pain, or disability, we recommend daily walking in a swimming pool with water up to waist to reduce pressure from joints while allow for adequate exercise.    BLOOD GLUCOSES Monitoring your blood glucoses is important for continued management of your diabetes. Please check your blood glucoses 2-4 times a day: fasting, before meals and at bedtime (you can rotate these measurements - e.g. one day check before the 3 meals, the next day check before 2 of the meals and before bedtime, etc.).   HYPOGLYCEMIA (low blood sugar) Hypoglycemia is usually a reaction to not eating, exercising, or taking too much insulin/ other diabetes drugs.  Symptoms include tremors, sweating, hunger, confusion, headache, etc. Treat IMMEDIATELY with 15 grams of  Carbs: . 4 glucose tablets .  cup regular juice/soda . 2 tablespoons raisins . 4 teaspoons sugar . 1 tablespoon honey Recheck blood glucose in 15 mins and repeat above if still symptomatic/blood glucose <100.  RECOMMENDATIONS TO REDUCE YOUR RISK OF DIABETIC COMPLICATIONS: * Take your prescribed MEDICATION(S) * Follow a DIABETIC diet: Complex carbs, fiber rich foods, (monounsaturated and polyunsaturated) fats * AVOID saturated/trans fats, high fat foods, >2,300 mg salt per day. * EXERCISE at least 5 times a week for 30 minutes or preferably daily.  * DO NOT SMOKE OR DRINK more than 1 drink a day. * Check your FEET every day. Do not wear tightfitting shoes. Contact us if you develop an ulcer * See your EYE doctor once a year or more if needed * Get a FLU shot once a year * Get a PNEUMONIA vaccine once before and once after age 27 years  GOALS:  * Your Hemoglobin A1c of <7%  * fasting sugars need to be <130 * after meals sugars need to be <180 (2h after you start eating) * Your Systolic BP should be [id number] or lower  * Your Diastolic BP should be 80 or lower  * Your HDL (Good Cholesterol) should be 40 or higher  * Your LDL (Bad Cholesterol) should be 100 or lower. * Your Triglycerides should be 150 or lower  * Your Urine microalbumin (kidney function) should be <30 * Your Body Mass Index should be 25 or lower    Please consider the following ways to cut down carbs and fat and increase fiber and micronutrients in  your diet: - substitute whole grain for white bread or pasta - substitute brown rice for white rice - substitute 90-calorie flat bread pieces for slices of bread when possible - substitute sweet potatoes or yams for white potatoes - substitute humus for margarine - substitute tofu for cheese when possible - substitute almond or rice milk for regular milk (would not drink soy milk daily due to concern for soy estrogen influence on breast cancer risk) - substitute dark  chocolate for other sweets when possible - substitute water - can add lemon or orange slices for taste - for diet sodas (artificial sweeteners will trick your body that you can eat sweets without getting calories and will lead you to overeating and weight gain in the long run) - do not skip breakfast or other meals (this will slow down the metabolism and will result in more weight gain over time)  - can try smoothies made from fruit and almond/rice milk in am instead of regular breakfast - can also try old-fashioned (not instant) oatmeal made with almond/rice milk in am - order the dressing on the side when eating salad at a restaurant (pour less than half of the dressing on the salad) - eat as little meat as possible - can try juicing, but should not forget that juicing will get rid of the fiber, so would alternate with eating raw veg./fruits or drinking smoothies - use as little oil as possible, even when using olive oil - can dress a salad with a mix of balsamic vinegar and lemon juice, for e.g. - use agave nectar, stevia sugar, or regular sugar rather than artificial sweateners - steam or broil/roast veggies  - snack on veggies/fruit/nuts (unsalted, preferably) when possible, rather than processed foods - reduce or eliminate aspartame in diet (it is in diet sodas, chewing gum, etc) Read the labels!  Try to read Dr. Janene Harvey book: "Program for Reversing Diabetes" for other ideas for healthy eating.

## 2016-12-06 NOTE — Progress Notes (Signed)
Patient ID: Angela Mays, female   DOB: Oct 31, 1951, 65 y.o.   MRN: [id number]   HPI: Angela Mays is a 65 y.o.-year-old female, referred by her PCP, Dr. Zadie Rhine, for management of DM2, dx in 09/2014, non-insulin-dependent, uncontrolled, without long term complications.  Last hemoglobin A1c was: Lab Results  Component Value Date   HGBA1C 8.2 (H) 11/22/2016   Pt is on a regimen of: - Metformin 500 mg in am and 1000 mg 2x a day after she eats - diarrhea - Amaryl 2 mg 2x a day with meals "I am not compliant with my meds" - Forgets many doses or takes them after she eats  Pt does not check her sugars at home. She has a Glucometer: One Touch Ultra mini. Reviewing the meter, last sugars were checked in September: - am: 144-184, 201 - 2h after b'fast: n/c - before lunch: n/c - 2h after lunch: n/c - before dinner: n/c - 2h after dinner: n/c - bedtime: n/c - nighttime: n/c  Pt's meals are: - Breakfast: oatmeal or PB sandwich or bacon and eggs or cereals or skips during the week - Lunch: sandwich or spaghetti or fast foods - Dinner: largest - meat + veggies + starch - Snacks: 2-3: chocolate, potato chips,  M&M Sweet tea after lunch, whole milk.  - no CKD, last BUN/creatinine:  Lab Results  Component Value Date   BUN 15 10/21/2016   CREATININE 0.73 10/21/2016   - last set of lipids: No results found for: CHOL, HDL, LDLCALC, LDLDIRECT, TRIG, CHOLHDL - last eye exam was in 2016. No DR.  - no numbness and tingling in her feet.  Pt has FH of DM in PGM.  Of note, patient has a new diagnosis of endometrial cancer >> will have hysterectomy in 12/2016.  ROS: Constitutional: no weight gain/loss, no fatigue, no subjective hyperthermia/hypothermia Eyes: + blurry vision, no xerophthalmia ENT: no sore throat, no nodules palpated in throat, no dysphagia/odynophagia, no hoarseness Cardiovascular: no CP/SOB/palpitations/leg swelling Respiratory: no cough/SOB Gastrointestinal: no N/V/+ D/no  C Musculoskeletal: no muscle/joint aches Skin: no rashes Neurological: no tremors/numbness/tingling/dizziness Psychiatric: no depression/anxiety  Past Medical History:  Diagnosis Date  . Diabetes mellitus without complication ([place])   . Leg cramps    and feet  . Pneumonia   . Skin rash 10/21/2016   Past Surgical History:  Procedure Laterality Date  . COLONOSCOPY    . DILATATION & CURETTAGE/HYSTEROSCOPY WITH MYOSURE N/A 11/02/2016   Procedure: DILATATION & CURETTAGE/HYSTEROSCOPY WITH MYOSURE;  Surgeon: Molli Posey, MD;  Location: [facility];  Service: Gynecology;  Laterality: N/A;  . DILATION AND CURETTAGE OF UTERUS     Social History   Social History  . Marital status: Single    Spouse name: N/A  . Number of children: 2   Occupational History  . Glass blower/designer    Social History Main Topics  . Smoking status: Never Smoker  . Smokeless tobacco: Never Used  . Alcohol use No  . Drug use: No   Current Outpatient Prescriptions on File Prior to Visit  Medication Sig Dispense Refill  . glipiZIDE (GLUCOTROL XL) 2.5 MG 24 hr tablet Take 2 mg by mouth daily with breakfast.    . megestrol (MEGACE) 40 MG tablet Take 1 tablet (40 mg total) by mouth 2 (two) times daily. 80 tablet 0  . metFORMIN (GLUCOPHAGE) 500 MG tablet Take by mouth 2 (two) times daily with a meal.     No current facility-administered medications on file prior to  visit.    Allergies  Allergen Reactions  . Flagyl [Metronidazole]    Family History  Problem Relation Age of Onset  . Heart Problems Father   . Cervical cancer Sister   . High blood pressure Son   . High blood pressure Son   . Breast cancer Sister    PE: BP 112/68 (BP Location: Right Arm, Patient Position: Sitting, Cuff Size: Large)   Pulse 81   Ht 5\' 4"  (1.626 m)   Wt 201 lb (91.2 kg)   SpO2 98%   BMI 34.50 kg/m  Wt Readings from Last 3 Encounters:  12/06/16 201 lb (91.2 kg)  11/22/16 202 lb (91.6 kg)  10/21/16 206 lb 8 oz (93.7 kg)    Constitutional: overweight, in NAD Eyes: PERRLA, EOMI, no exophthalmos ENT: moist mucous membranes, no thyromegaly, no cervical lymphadenopathy Cardiovascular: RRR, No MRG Respiratory: CTA B Gastrointestinal: abdomen soft, NT, ND, BS+ Musculoskeletal: no deformities, strength intact in all 4 Skin: moist, warm, no rashes Neurological: no tremor with outstretched hands, DTR normal in all 4  ASSESSMENT: 1. DM2, non-insulin-dependent, uncontrolled, without long term complications, but with hyperglycemia  PLAN:  1. Patient with 2 years of uncontrolled diabetes, on oral antidiabetic regimen, but with poor control due to noncompliance with the medications. She is not checking her sugars at home (she was taking sporadically over the summer in a.m. and his sugars were high). She is also missing medication doses or takes them at inappropriate times (glimepiride after meals or at bedtime). We discussed about the importance of taking the medications as prescribed for maximum effect. I advised her to move that the metal right to before meals and to take the metformin right after she finishes a meal to improve GI side effect profile. She has mild diarrhea from metformin currently. I will also increase the dose to 1000 mg twice a day. I asked her to start checking her sugars and bring her log at next visit. We will adjust the medication at that time depending on the levels.  - I advised her to start drinking sweet tea. - I suggested to:  Patient Instructions  Please change metformin to the XR formulation and increase to 1000 mg twice a day (right after meals).  Please move glimepiride 2 mg to before breakfast and dinner (15-30 min before meals). If you have a particularly larger meal, you may take 2 tablets before that meal.  Please let me know if the sugars are consistently <80 or >200.  Please return in 1.5 months with your sugar log.   - Strongly advised her to start checking sugars at different  times of the day - check 1 time a day, rotating checks - given sugar log and advised how to fill it and to bring it at next appt  - given foot care handout and explained the principles  - given instructions for hypoglycemia management "15-15 rule"  - advised for yearly eye exams >> she is due for another one - Return to clinic in 1.5 mo with sugar log   Philemon Kingdom, MD PhD [facility]

## 2016-12-08 ENCOUNTER — Telehealth: Payer: Self-pay | Admitting: Nurse Practitioner

## 2016-12-08 NOTE — Telephone Encounter (Signed)
Patient updated that her FMLA papers are complete. She gave me fax number to [place] at [phone], forms sent. An updated complete copy will be mailed to patient. She verbalizes understanding and thanks for the update.

## 2016-12-08 NOTE — Telephone Encounter (Signed)
Patient calling, confused about her surgical date. She thought surgery is on 1/9 but it is confirmed on 1/16, first case of the day. FMLA papers filled out according to 1/16 date. She is informed that pre-surgical testing will call soon to schedule a pre op appt. She verbalizes agreement and is understanding of the miscommunication.

## 2016-12-20 DIAGNOSIS — C801 Malignant (primary) neoplasm, unspecified: Secondary | ICD-10-CM

## 2016-12-20 HISTORY — DX: Malignant (primary) neoplasm, unspecified: C80.1

## 2016-12-22 ENCOUNTER — Encounter: Payer: Self-pay | Admitting: Family Medicine

## 2016-12-29 NOTE — Patient Instructions (Addendum)
Angela Mays  12/29/2016   Your procedure is scheduled on: 01-04-17  Report to [facility] Main  Entrance take [facility]  elevators to 3rd floor to  [place] at 530 AM.  Call this number if you have problems the morning of surgery [phone]   Remember: ONLY 1 PERSON MAY GO WITH YOU TO SHORT STAY TO GET  READY MORNING OF [place].  Do not eat food or drink liquids :After Midnight.LIGHT DIET DAY BEFORE SURGERY 01-04-16 SEE INSTRCTIONS BELOW, NOTHING TO EAT OR DRINK AFTER MIDNIGHT MONDAY NIGHT.      Take these medicines the morning of surgery with A SIP OF WATER: none DO NOT TAKE ANY DIABETIC MEDICATIONS DAY OF YOUR SURGERY                               You may not have any metal on your body including hair pins and              piercings  Do not wear jewelry, make-up, lotions, powders or perfumes, deodorant             Do not wear nail polish.  Do not shave  48 hours prior to surgery.              Men may shave face and neck.   Do not bring valuables to the hospital. [place].  Contacts, dentures or bridgework may not be worn into surgery.  Leave suitcase in the car. After surgery it may be brought to your room.                Please read over the following fact sheets you were given: _____________________________________________________________________             Eat a light diet the day before surgery.  Examples including soups, broths, toast, yogurt, mashed potatoes.  Things to avoid include carbonated beverages (fizzy beverages), raw fruits and raw vegetables, or beans.   If your bowels are filled with gas, your surgeon will have difficulty visualizing your pelvic organs which increases your surgical risks.How to Manage Your Diabetes Before and After Surgery  Why is it important to control my blood sugar before and after surgery? . Improving blood sugar levels before and after  surgery helps healing and can limit problems. . A way of improving blood sugar control is eating a healthy diet by: o  Eating less sugar and carbohydrates o  Increasing activity/exercise o  Talking with your doctor about reaching your blood sugar goals . High blood sugars (greater than 180 mg/dL) can raise your risk of infections and slow your recovery, so you will need to focus on controlling your diabetes during the weeks before surgery. . Make sure that the doctor who takes care of your diabetes knows about your planned surgery including the date and location.  How do I manage my blood sugar before surgery? . Check your blood sugar at least 4 times a day, starting 2 days before surgery, to make sure that the level is not too high or low. o Check your blood sugar the morning of your surgery when you wake up  and every 2 hours until you get to the Short Stay unit. . If your blood sugar is less than 70 mg/dL, you will need to treat for low blood sugar: o Do not take insulin. o Treat a low blood sugar (less than 70 mg/dL) with  cup of clear juice (cranberry or apple), 4 glucose tablets, OR glucose gel. o Recheck blood sugar in 15 minutes after treatment (to make sure it is greater than 70 mg/dL). If your blood sugar is not greater than 70 mg/dL on recheck, call [phone] for further instructions. . Report your blood sugar to the short stay nurse when you get to Short Stay.  . If you are admitted to the hospital after surgery: o Your blood sugar will be checked by the staff and you will probably be given insulin after surgery (instead of oral diabetes medicines) to make sure you have good blood sugar levels. o The goal for blood sugar control after surgery is 80-180 mg/dL.   WHAT DO I DO ABOUT MY DIABETES MEDICATION? YOU MAY TAKE YOUR METFORMIN DOSES AS USUAL ON 1-1-518, DO NOT TAKE YOUR METFORMIN MORNING OF SURGERY ON 01-04-17 YOU MAY TAKE YOUR GLIMPIRIDE MORNING DOSE ONLY ONLY 01-03-17, DO  NOT TAKE ANY GLIMPIRIDE MORNING OF SURGERY 01-04-17.  Patient Signature:  Date:   Nurse Signature:  Date:   Reviewed and Endorsed by [facility] Patient Education Committee, August 2015[facility] - Preparing for Surgery Before surgery, you can play an important role.  Because skin is not sterile, your skin needs to be as free of germs as possible.  You can reduce the number of germs on your skin by washing with CHG (chlorahexidine gluconate) soap before surgery.  CHG is an antiseptic cleaner which kills germs and bonds with the skin to continue killing germs even after washing. Please DO NOT use if you have an allergy to CHG or antibacterial soaps.  If your skin becomes reddened/irritated stop using the CHG and inform your nurse when you arrive at Short Stay. Do not shave (including legs and underarms) for at least 48 hours prior to the first CHG shower.  You may shave your face/neck. Please follow these instructions carefully:  1.  Shower with CHG Soap the night before surgery and the  morning of Surgery.  2.  If you choose to wash your hair, wash your hair first as usual with your  normal  shampoo.  3.  After you shampoo, rinse your hair and body thoroughly to remove the  shampoo.                           4.  Use CHG as you would any other liquid soap.  You can apply chg directly  to the skin and wash                       Gently with a scrungie or clean washcloth.  5.  Apply the CHG Soap to your body ONLY FROM THE NECK DOWN.   Do not use on face/ open                           Wound or open sores. Avoid contact with eyes, ears mouth and genitals (private parts).                       Wash face,  Development worker, international aid (private  parts) with your normal soap.             6.  Wash thoroughly, paying special attention to the area where your surgery  will be performed.  7.  Thoroughly rinse your body with warm water from the neck down.  8.  DO NOT shower/wash with your normal soap after using and rinsing off  the  CHG Soap.                9.  Pat yourself dry with a clean towel.            10.  Wear clean pajamas.            11.  Place clean sheets on your bed the night of your first shower and do not  sleep with pets. Day of Surgery : Do not apply any lotions/deodorants the morning of surgery.  Please wear clean clothes to the hospital/surgery center.  FAILURE TO FOLLOW THESE INSTRUCTIONS MAY RESULT IN THE CANCELLATION OF YOUR SURGERY PATIENT SIGNATURE_________________________________  NURSE SIGNATURE__________________________________  ________________________________________________________________________   Adam Phenix  An incentive spirometer is a tool that can help keep your lungs clear and active. This tool measures how well you are filling your lungs with each breath. Taking long deep breaths may help reverse or decrease the chance of developing breathing (pulmonary) problems (especially infection) following:  A long period of time when you are unable to move or be active. BEFORE THE PROCEDURE   If the spirometer includes an indicator to show your best effort, your nurse or respiratory therapist will set it to a desired goal.  If possible, sit up straight or lean slightly forward. Try not to slouch.  Hold the incentive spirometer in an upright position. INSTRUCTIONS FOR USE  1. Sit on the edge of your bed if possible, or sit up as far as you can in bed or on a chair. 2. Hold the incentive spirometer in an upright position. 3. Breathe out normally. 4. Place the mouthpiece in your mouth and seal your lips tightly around it. 5. Breathe in slowly and as deeply as possible, raising the piston or the ball toward the top of the column. 6. Hold your breath for 3-5 seconds or for as long as possible. Allow the piston or ball to fall to the bottom of the column. 7. Remove the mouthpiece from your mouth and breathe out normally. 8. Rest for a few seconds and repeat Steps 1 through 7 at  least 10 times every 1-2 hours when you are awake. Take your time and take a few normal breaths between deep breaths. 9. The spirometer may include an indicator to show your best effort. Use the indicator as a goal to work toward during each repetition. 10. After each set of 10 deep breaths, practice coughing to be sure your lungs are clear. If you have an incision (the cut made at the time of surgery), support your incision when coughing by placing a pillow or rolled up towels firmly against it. Once you are able to get out of bed, walk around indoors and cough well. You may stop using the incentive spirometer when instructed by your caregiver.  RISKS AND COMPLICATIONS  Take your time so you do not get dizzy or light-headed.  If you are in pain, you may need to take or ask for pain medication before doing incentive spirometry. It is harder to take a deep breath if you are having pain. AFTER USE  Rest  and breathe slowly and easily.  It can be helpful to keep track of a log of your progress. Your caregiver can provide you with a simple table to help with this. If you are using the spirometer at home, follow these instructions: [place] IF:   You are having difficultly using the spirometer.  You have trouble using the spirometer as often as instructed.  Your pain medication is not giving enough relief while using the spirometer.  You develop fever of 100.5 F (38.1 C) or higher. SEEK IMMEDIATE MEDICAL CARE IF:   You cough up bloody sputum that had not been present before.  You develop fever of 102 F (38.9 C) or greater.  You develop worsening pain at or near the incision site. MAKE SURE YOU:   Understand these instructions.  Will watch your condition.  Will get help right away if you are not doing well or get worse. Document Released: 04/18/2007 Document Revised: 02/28/2012 Document Reviewed: 06/19/2007 ExitCare Patient Information 2014 ExitCare,  [place].   ________________________________________________________________________  WHAT IS A BLOOD TRANSFUSION? Blood Transfusion Information  A transfusion is the replacement of blood or some of its parts. Blood is made up of multiple cells which provide different functions.  Red blood cells carry oxygen and are used for blood loss replacement.  White blood cells fight against infection.  Platelets control bleeding.  Plasma helps clot blood.  Other blood products are available for specialized needs, such as hemophilia or other clotting disorders. BEFORE THE TRANSFUSION  Who gives blood for transfusions?   Healthy volunteers who are fully evaluated to make sure their blood is safe. This is blood bank blood. Transfusion therapy is the safest it has ever been in the practice of medicine. Before blood is taken from a donor, a complete history is taken to make sure that person has no history of diseases nor engages in risky social behavior (examples are intravenous drug use or sexual activity with multiple partners). The donor's travel history is screened to minimize risk of transmitting infections, such as malaria. The donated blood is tested for signs of infectious diseases, such as HIV and hepatitis. The blood is then tested to be sure it is compatible with you in order to minimize the chance of a transfusion reaction. If you or a relative donates blood, this is often done in anticipation of surgery and is not appropriate for emergency situations. It takes many days to process the donated blood. RISKS AND COMPLICATIONS Although transfusion therapy is very safe and saves many lives, the main dangers of transfusion include:   Getting an infectious disease.  Developing a transfusion reaction. This is an allergic reaction to something in the blood you were given. Every precaution is taken to prevent this. The decision to have a blood transfusion has been considered carefully by your caregiver  before blood is given. Blood is not given unless the benefits outweigh the risks. AFTER THE TRANSFUSION  Right after receiving a blood transfusion, you will usually feel much better and more energetic. This is especially true if your red blood cells have gotten low (anemic). The transfusion raises the level of the red blood cells which carry oxygen, and this usually causes an energy increase.  The nurse administering the transfusion will monitor you carefully for complications. HOME CARE INSTRUCTIONS  No special instructions are needed after a transfusion. You may find your energy is better. Speak with your caregiver about any limitations on activity for underlying diseases you may have. SEEK  MEDICAL CARE IF:   Your condition is not improving after your transfusion.  You develop redness or irritation at the intravenous (IV) site. SEEK IMMEDIATE MEDICAL CARE IF:  Any of the following symptoms occur over the next 12 hours:  Shaking chills.  You have a temperature by mouth above 102 F (38.9 C), not controlled by medicine.  Chest, back, or muscle pain.  People around you feel you are not acting correctly or are confused.  Shortness of breath or difficulty breathing.  Dizziness and fainting.  You get a rash or develop hives.  You have a decrease in urine output.  Your urine turns a dark color or changes to pink, red, or brown. Any of the following symptoms occur over the next 10 days:  You have a temperature by mouth above 102 F (38.9 C), not controlled by medicine.  Shortness of breath.  Weakness after normal activity.  The white part of the eye turns yellow (jaundice).  You have a decrease in the amount of urine or are urinating less often.  Your urine turns a dark color or changes to pink, red, or brown. Document Released: 12/03/2000 Document Revised: 02/28/2012 Document Reviewed: 07/22/2008 [facility] Patient Information 2014 [place],  [place].  _______________________________________________________________________

## 2016-12-29 NOTE — Progress Notes (Addendum)
EKG 10-21-16 EPIC HEMAGLOBIN a1c 11-22-17 EPIC

## 2016-12-30 ENCOUNTER — Encounter (HOSPITAL_COMMUNITY): Payer: Self-pay

## 2016-12-30 ENCOUNTER — Encounter (HOSPITAL_COMMUNITY)
Admission: RE | Admit: 2016-12-30 | Discharge: 2016-12-30 | Disposition: A | Discharge status: Home / Self Care | Payer: Medicare Other | Source: Ambulatory Visit | Attending: Gynecologic Oncology | Admitting: Gynecologic Oncology

## 2016-12-30 ENCOUNTER — Ambulatory Visit (HOSPITAL_COMMUNITY)
Admission: RE | Admit: 2016-12-30 | Discharge: 2016-12-30 | Disposition: A | Discharge status: Home / Self Care | Payer: Medicare Other | Source: Ambulatory Visit | Attending: Gynecologic Oncology | Admitting: Gynecologic Oncology

## 2016-12-30 DIAGNOSIS — K449 Diaphragmatic hernia without obstruction or gangrene: Secondary | ICD-10-CM | POA: Diagnosis not present

## 2016-12-30 DIAGNOSIS — C541 Malignant neoplasm of endometrium: Secondary | ICD-10-CM

## 2016-12-30 HISTORY — DX: Malignant (primary) neoplasm, unspecified: C80.1

## 2016-12-30 HISTORY — DX: Nausea with vomiting, unspecified: R11.2

## 2016-12-30 HISTORY — DX: Other specified postprocedural states: Z98.890

## 2016-12-30 LAB — CBC WITH DIFFERENTIAL/PLATELET
BASOS ABS: 0 10*3/uL (ref 0.0–0.1)
Basophils Relative: 0 %
EOS PCT: 1 %
Eosinophils Absolute: 0.1 10*3/uL (ref 0.0–0.7)
HCT: 40.3 % (ref 36.0–46.0)
Hemoglobin: 13.4 g/dL (ref 12.0–15.0)
Lymphocytes Relative: 32 %
Lymphs Abs: 3.5 10*3/uL (ref 0.7–4.0)
MCH: 27.3 pg (ref 26.0–34.0)
MCHC: 33.3 g/dL (ref 30.0–36.0)
MCV: 82.2 fL (ref 78.0–100.0)
MONO ABS: 0.5 10*3/uL (ref 0.1–1.0)
Monocytes Relative: 5 %
Neutro Abs: 6.7 10*3/uL (ref 1.7–7.7)
Neutrophils Relative %: 62 %
PLATELETS: 200 10*3/uL (ref 150–400)
RBC: 4.9 MIL/uL (ref 3.87–5.11)
RDW: 14.4 % (ref 11.5–15.5)
WBC: 10.9 10*3/uL — ABNORMAL HIGH (ref 4.0–10.5)

## 2016-12-30 LAB — URINALYSIS, ROUTINE W REFLEX MICROSCOPIC
BACTERIA UA: NONE SEEN
Bilirubin Urine: NEGATIVE
GLUCOSE, UA: 50 mg/dL — AB
HGB URINE DIPSTICK: NEGATIVE
KETONES UR: NEGATIVE mg/dL
NITRITE: NEGATIVE
PROTEIN: NEGATIVE mg/dL
Specific Gravity, Urine: 1.021 (ref 1.005–1.030)
pH: 5 (ref 5.0–8.0)

## 2016-12-30 LAB — COMPREHENSIVE METABOLIC PANEL
ALBUMIN: 3.6 g/dL (ref 3.5–5.0)
ALT: 23 U/L (ref 14–54)
AST: 15 U/L (ref 15–41)
Alkaline Phosphatase: 72 U/L (ref 38–126)
Anion gap: 6 (ref 5–15)
BUN: 13 mg/dL (ref 6–20)
CHLORIDE: 108 mmol/L (ref 101–111)
CO2: 25 mmol/L (ref 22–32)
Calcium: 9.1 mg/dL (ref 8.9–10.3)
Creatinine, Ser: 0.7 mg/dL (ref 0.44–1.00)
GFR calc Af Amer: >60 mL/min (ref >60)
GFR calc non Af Amer: >60 mL/min (ref >60)
GLUCOSE: 111 mg/dL — AB (ref 65–99)
POTASSIUM: 3.9 mmol/L (ref 3.5–5.1)
SODIUM: 139 mmol/L (ref 135–145)
Total Bilirubin: 0.6 mg/dL (ref 0.3–1.2)
Total Protein: 6.7 g/dL (ref 6.5–8.1)

## 2016-12-30 LAB — ABO/RH: ABO/RH(D): A POS

## 2016-12-30 LAB — GLUCOSE, CAPILLARY: GLUCOSE-CAPILLARY: 124 mg/dL — AB (ref 65–99)

## 2017-01-04 ENCOUNTER — Encounter (HOSPITAL_COMMUNITY)
Admission: RE | Disposition: A | Discharge status: Home / Self Care | Payer: Self-pay | Source: Ambulatory Visit | Attending: Gynecologic Oncology

## 2017-01-04 ENCOUNTER — Encounter (HOSPITAL_COMMUNITY): Payer: Self-pay | Admitting: *Deleted

## 2017-01-04 ENCOUNTER — Ambulatory Visit (HOSPITAL_COMMUNITY): Payer: Medicare Other | Admitting: Anesthesiology

## 2017-01-04 ENCOUNTER — Observation Stay (HOSPITAL_COMMUNITY)
Admission: RE | Admit: 2017-01-04 | Discharge: 2017-01-04 | Disposition: A | Discharge status: Home / Self Care | Payer: Medicare Other | Source: Ambulatory Visit | Attending: Gynecologic Oncology | Admitting: Gynecologic Oncology

## 2017-01-04 DIAGNOSIS — N83291 Other ovarian cyst, right side: Secondary | ICD-10-CM | POA: Insufficient documentation

## 2017-01-04 DIAGNOSIS — N800 Endometriosis of uterus: Secondary | ICD-10-CM | POA: Diagnosis not present

## 2017-01-04 DIAGNOSIS — C542 Malignant neoplasm of myometrium: Secondary | ICD-10-CM | POA: Diagnosis not present

## 2017-01-04 DIAGNOSIS — C541 Malignant neoplasm of endometrium: Secondary | ICD-10-CM | POA: Diagnosis not present

## 2017-01-04 DIAGNOSIS — Z9851 Tubal ligation status: Secondary | ICD-10-CM | POA: Diagnosis not present

## 2017-01-04 DIAGNOSIS — Z7984 Long term (current) use of oral hypoglycemic drugs: Secondary | ICD-10-CM | POA: Insufficient documentation

## 2017-01-04 DIAGNOSIS — N83292 Other ovarian cyst, left side: Secondary | ICD-10-CM | POA: Insufficient documentation

## 2017-01-04 DIAGNOSIS — E119 Type 2 diabetes mellitus without complications: Secondary | ICD-10-CM | POA: Diagnosis not present

## 2017-01-04 DIAGNOSIS — N7011 Chronic salpingitis: Secondary | ICD-10-CM | POA: Diagnosis not present

## 2017-01-04 DIAGNOSIS — Z888 Allergy status to other drugs, medicaments and biological substances status: Secondary | ICD-10-CM | POA: Insufficient documentation

## 2017-01-04 DIAGNOSIS — N840 Polyp of corpus uteri: Secondary | ICD-10-CM | POA: Diagnosis not present

## 2017-01-04 DIAGNOSIS — Z6834 Body mass index (BMI) 34.0-34.9, adult: Secondary | ICD-10-CM | POA: Insufficient documentation

## 2017-01-04 DIAGNOSIS — Z9114 Patient's other noncompliance with medication regimen: Secondary | ICD-10-CM | POA: Insufficient documentation

## 2017-01-04 HISTORY — PX: LYMPH NODE BIOPSY: SHX201

## 2017-01-04 HISTORY — PX: ROBOTIC ASSISTED TOTAL HYSTERECTOMY WITH BILATERAL SALPINGO OOPHERECTOMY: SHX6086

## 2017-01-04 LAB — GLUCOSE, CAPILLARY
GLUCOSE-CAPILLARY: 168 mg/dL — AB (ref 65–99)
GLUCOSE-CAPILLARY: 185 mg/dL — AB (ref 65–99)
GLUCOSE-CAPILLARY: 252 mg/dL — AB (ref 65–99)
GLUCOSE-CAPILLARY: 234 mg/dL — AB (ref 65–99)
Glucose-Capillary: 109 mg/dL — ABNORMAL HIGH (ref 65–99)

## 2017-01-04 LAB — TYPE AND SCREEN
ABO/RH(D): A POS
Antibody Screen: NEGATIVE

## 2017-01-04 SURGERY — HYSTERECTOMY, TOTAL, ROBOT-ASSISTED, LAPAROSCOPIC, WITH BILATERAL SALPINGO-OOPHORECTOMY
Anesthesia: General

## 2017-01-04 MED ORDER — SUGAMMADEX SODIUM 200 MG/2ML IV SOLN
INTRAVENOUS | Status: AC
  Filled 2017-01-04: qty 2

## 2017-01-04 MED ORDER — OXYCODONE-ACETAMINOPHEN 5-325 MG PO TABS: 1.0000 | ORAL_TABLET | ORAL | Status: DC | PRN

## 2017-01-04 MED ORDER — IBUPROFEN 800 MG PO TABS: 800.0000 mg | ORAL_TABLET | Freq: Three times a day (TID) | ORAL | 0 refills | Status: DC | PRN

## 2017-01-04 MED ORDER — LIDOCAINE 2% (20 MG/ML) 5 ML SYRINGE
INTRAMUSCULAR | Status: AC
  Filled 2017-01-04: qty 5

## 2017-01-04 MED ORDER — SENNA 8.6 MG PO TABS: 1.0000 | ORAL_TABLET | Freq: Every day | ORAL | 0 refills | Status: DC

## 2017-01-04 MED ORDER — MIDAZOLAM HCL 5 MG/5ML IJ SOLN
INTRAMUSCULAR | Status: DC | PRN
  Administered 2017-01-04: 2 mg via INTRAVENOUS

## 2017-01-04 MED ORDER — SUGAMMADEX SODIUM 200 MG/2ML IV SOLN
INTRAVENOUS | Status: DC | PRN
  Administered 2017-01-04: 200 mg via INTRAVENOUS

## 2017-01-04 MED ORDER — DEXAMETHASONE SODIUM PHOSPHATE 10 MG/ML IJ SOLN
INTRAMUSCULAR | Status: DC | PRN
  Administered 2017-01-04: 10 mg via INTRAVENOUS

## 2017-01-04 MED ORDER — ONDANSETRON HCL 4 MG/2ML IJ SOLN
INTRAMUSCULAR | Status: DC | PRN
  Administered 2017-01-04: 4 mg via INTRAVENOUS

## 2017-01-04 MED ORDER — FENTANYL CITRATE (PF) 100 MCG/2ML IJ SOLN
INTRAMUSCULAR | Status: AC
  Filled 2017-01-04: qty 2

## 2017-01-04 MED ORDER — SUCCINYLCHOLINE CHLORIDE 200 MG/10ML IV SOSY
PREFILLED_SYRINGE | INTRAVENOUS | Status: DC | PRN
  Administered 2017-01-04: 120 mg via INTRAVENOUS

## 2017-01-04 MED ORDER — LACTATED RINGERS IV SOLN
INTRAVENOUS | Status: DC | PRN
  Administered 2017-01-04 (×2): via INTRAVENOUS

## 2017-01-04 MED ORDER — ROCURONIUM BROMIDE 50 MG/5ML IV SOSY
PREFILLED_SYRINGE | INTRAVENOUS | Status: AC
  Filled 2017-01-04: qty 5

## 2017-01-04 MED ORDER — DEXAMETHASONE SODIUM PHOSPHATE 10 MG/ML IJ SOLN
INTRAMUSCULAR | Status: AC
  Filled 2017-01-04: qty 1

## 2017-01-04 MED ORDER — LACTATED RINGERS IV SOLN
INTRAVENOUS | Status: DC | PRN
  Administered 2017-01-04: 1000 mL

## 2017-01-04 MED ORDER — SUCCINYLCHOLINE CHLORIDE 200 MG/10ML IV SOSY
PREFILLED_SYRINGE | INTRAVENOUS | Status: AC
  Filled 2017-01-04: qty 10

## 2017-01-04 MED ORDER — INSULIN ASPART 100 UNIT/ML ~~LOC~~ SOLN
0.0000 [IU] | Freq: Three times a day (TID) | SUBCUTANEOUS | Status: DC
  Administered 2017-01-04: 8 [IU] via SUBCUTANEOUS
  Administered 2017-01-04: 3 [IU] via SUBCUTANEOUS

## 2017-01-04 MED ORDER — HYDROMORPHONE HCL 2 MG/ML IJ SOLN: 0.2000 mg | INTRAMUSCULAR | Status: DC | PRN

## 2017-01-04 MED ORDER — ONDANSETRON HCL 4 MG/2ML IJ SOLN
INTRAMUSCULAR | Status: AC
  Filled 2017-01-04: qty 2

## 2017-01-04 MED ORDER — IBUPROFEN 800 MG PO TABS: 800.0000 mg | ORAL_TABLET | Freq: Three times a day (TID) | ORAL | Status: DC | PRN

## 2017-01-04 MED ORDER — HYDROMORPHONE HCL 1 MG/ML IJ SOLN
INTRAMUSCULAR | Status: AC
  Filled 2017-01-04: qty 1

## 2017-01-04 MED ORDER — ENOXAPARIN SODIUM 40 MG/0.4ML ~~LOC~~ SOLN
40.0000 mg | SUBCUTANEOUS | Status: AC
  Administered 2017-01-04: 40 mg via SUBCUTANEOUS
  Filled 2017-01-04: qty 0.4

## 2017-01-04 MED ORDER — PROPOFOL 10 MG/ML IV BOLUS
INTRAVENOUS | Status: AC
  Filled 2017-01-04: qty 20

## 2017-01-04 MED ORDER — ENOXAPARIN SODIUM 40 MG/0.4ML ~~LOC~~ SOLN: 40.0000 mg | SUBCUTANEOUS | Status: DC

## 2017-01-04 MED ORDER — SCOPOLAMINE 1 MG/3DAYS TD PT72
MEDICATED_PATCH | TRANSDERMAL | Status: AC
  Filled 2017-01-04: qty 1

## 2017-01-04 MED ORDER — STERILE WATER FOR INJECTION IJ SOLN
INTRAMUSCULAR | Status: DC | PRN
  Administered 2017-01-04: 4 mL

## 2017-01-04 MED ORDER — INSULIN ASPART 100 UNIT/ML ~~LOC~~ SOLN: 0.0000 [IU] | Freq: Every day | SUBCUTANEOUS | Status: DC

## 2017-01-04 MED ORDER — KETOROLAC TROMETHAMINE 30 MG/ML IJ SOLN: 30.0000 mg | Freq: Once | INTRAMUSCULAR | Status: DC | PRN

## 2017-01-04 MED ORDER — LIDOCAINE 2% (20 MG/ML) 5 ML SYRINGE
INTRAMUSCULAR | Status: DC | PRN
  Administered 2017-01-04: 100 mg via INTRAVENOUS

## 2017-01-04 MED ORDER — ONDANSETRON HCL 4 MG/2ML IJ SOLN: 4.0000 mg | Freq: Four times a day (QID) | INTRAMUSCULAR | Status: DC | PRN

## 2017-01-04 MED ORDER — PROPOFOL 10 MG/ML IV BOLUS
INTRAVENOUS | Status: DC | PRN
  Administered 2017-01-04: 150 mg via INTRAVENOUS

## 2017-01-04 MED ORDER — CEFAZOLIN SODIUM-DEXTROSE 2-4 GM/100ML-% IV SOLN
2.0000 g | INTRAVENOUS | Status: AC
  Administered 2017-01-04: 2 g via INTRAVENOUS

## 2017-01-04 MED ORDER — FENTANYL CITRATE (PF) 100 MCG/2ML IJ SOLN
INTRAMUSCULAR | Status: DC | PRN
  Administered 2017-01-04 (×2): 50 ug via INTRAVENOUS
  Administered 2017-01-04 (×2): 100 ug via INTRAVENOUS

## 2017-01-04 MED ORDER — SCOPOLAMINE 1 MG/3DAYS TD PT72
MEDICATED_PATCH | TRANSDERMAL | Status: DC | PRN
  Administered 2017-01-04: 1 via TRANSDERMAL

## 2017-01-04 MED ORDER — PROMETHAZINE HCL 25 MG/ML IJ SOLN: 6.2500 mg | INTRAMUSCULAR | Status: DC | PRN

## 2017-01-04 MED ORDER — TRAMADOL HCL 50 MG PO TABS: 100.0000 mg | ORAL_TABLET | Freq: Two times a day (BID) | ORAL | Status: DC | PRN

## 2017-01-04 MED ORDER — KCL IN DEXTROSE-NACL 20-5-0.45 MEQ/L-%-% IV SOLN
INTRAVENOUS | Status: DC
  Administered 2017-01-04: 12:00:00 via INTRAVENOUS
  Filled 2017-01-04 (×2): qty 1000

## 2017-01-04 MED ORDER — ROCURONIUM BROMIDE 10 MG/ML (PF) SYRINGE
PREFILLED_SYRINGE | INTRAVENOUS | Status: DC | PRN
  Administered 2017-01-04: 20 mg via INTRAVENOUS
  Administered 2017-01-04: 40 mg via INTRAVENOUS

## 2017-01-04 MED ORDER — MIDAZOLAM HCL 2 MG/2ML IJ SOLN
INTRAMUSCULAR | Status: AC
  Filled 2017-01-04: qty 2

## 2017-01-04 MED ORDER — STERILE WATER FOR INJECTION IJ SOLN
INTRAMUSCULAR | Status: AC
  Filled 2017-01-04: qty 10

## 2017-01-04 MED ORDER — ONDANSETRON HCL 4 MG PO TABS: 4.0000 mg | ORAL_TABLET | Freq: Four times a day (QID) | ORAL | Status: DC | PRN

## 2017-01-04 MED ORDER — HYDROMORPHONE HCL 1 MG/ML IJ SOLN
0.2500 mg | INTRAMUSCULAR | Status: DC | PRN
  Administered 2017-01-04 (×2): 0.5 mg via INTRAVENOUS

## 2017-01-04 MED ORDER — STERILE WATER FOR IRRIGATION IR SOLN
Status: DC | PRN
  Administered 2017-01-04: 1000 mL

## 2017-01-04 MED ORDER — CEFAZOLIN SODIUM-DEXTROSE 2-4 GM/100ML-% IV SOLN
INTRAVENOUS | Status: AC
  Filled 2017-01-04: qty 100

## 2017-01-04 SURGICAL SUPPLY — 64 items
ADH SKN CLS APL DERMABOND .7 (GAUZE/BANDAGES/DRESSINGS) ×2
AGENT HMST KT MTR STRL THRMB (HEMOSTASIS)
APL ESCP 34 STRL LF DISP (HEMOSTASIS)
APPLICATOR SURGIFLO ENDO (HEMOSTASIS) IMPLANT
BAG LAPAROSCOPIC 12 15 PORT 16 (BASKET) IMPLANT
BAG RETRIEVAL 12/15 (BASKET)
BAG SPEC RTRVL LRG 6X4 10 (ENDOMECHANICALS)
CHLORAPREP W/TINT 26ML (MISCELLANEOUS) ×3 IMPLANT
COVER SURGICAL LIGHT HANDLE (MISCELLANEOUS) ×3 IMPLANT
COVER TIP SHEARS 8 DVNC (MISCELLANEOUS) ×2 IMPLANT
COVER TIP SHEARS 8MM DA VINCI (MISCELLANEOUS) ×1
DERMABOND ADVANCED (GAUZE/BANDAGES/DRESSINGS) ×1
DERMABOND ADVANCED .7 DNX12 (GAUZE/BANDAGES/DRESSINGS) ×2 IMPLANT
DRAPE ARM DVNC X/XI (DISPOSABLE) ×8 IMPLANT
DRAPE COLUMN DVNC XI (DISPOSABLE) ×2 IMPLANT
DRAPE DA VINCI XI ARM (DISPOSABLE) ×4
DRAPE DA VINCI XI COLUMN (DISPOSABLE) ×1
DRAPE SHEET LG 3/4 BI-LAMINATE (DRAPES) ×6 IMPLANT
DRAPE SURG IRRIG POUCH 19X23 (DRAPES) ×3 IMPLANT
ELECT REM PT RETURN 15FT ADLT (MISCELLANEOUS) ×3 IMPLANT
GLOVE BIO SURGEON STRL SZ 6 (GLOVE) ×12 IMPLANT
GLOVE BIO SURGEON STRL SZ 6.5 (GLOVE) ×6 IMPLANT
GOWN STRL REUS W/ TWL LRG LVL3 (GOWN DISPOSABLE) ×4 IMPLANT
GOWN STRL REUS W/TWL LRG LVL3 (GOWN DISPOSABLE) ×6
HOLDER FOLEY CATH W/STRAP (MISCELLANEOUS) ×3 IMPLANT
IRRIG SUCT STRYKERFLOW 2 WTIP (MISCELLANEOUS) ×3
IRRIGATION SUCT STRKRFLW 2 WTP (MISCELLANEOUS) ×2 IMPLANT
KIT BASIN OR (CUSTOM PROCEDURE TRAY) ×3 IMPLANT
KIT PROCEDURE DA VINCI SI (MISCELLANEOUS) ×1
KIT PROCEDURE DVNC SI (MISCELLANEOUS) IMPLANT
MANIPULATOR UTERINE 4.5 ZUMI (MISCELLANEOUS) ×3 IMPLANT
MARKER SKIN DUAL TIP RULER LAB (MISCELLANEOUS) ×3 IMPLANT
NDL SAFETY ECLIPSE 18X1.5 (NEEDLE) ×2 IMPLANT
NDL SPNL 18GX3.5 QUINCKE PK (NEEDLE) ×2 IMPLANT
NEEDLE HYPO 18GX1.5 SHARP (NEEDLE) ×3
NEEDLE SPNL 18GX3.5 QUINCKE PK (NEEDLE) ×3 IMPLANT
OBTURATOR OPTICAL STANDARD 8MM (TROCAR) ×1
OBTURATOR OPTICAL STND 8 DVNC (TROCAR) ×2
OBTURATOR OPTICALSTD 8 DVNC (TROCAR) ×2 IMPLANT
OCCLUDER COLPOPNEUMO (BALLOONS) ×3 IMPLANT
PAD POSITIONING PINK XL (MISCELLANEOUS) ×3 IMPLANT
PORT ACCESS TROCAR AIRSEAL 12 (TROCAR) ×2 IMPLANT
PORT ACCESS TROCAR AIRSEAL 5M (TROCAR) ×1
POUCH SPECIMEN RETRIEVAL 10MM (ENDOMECHANICALS) IMPLANT
SEAL CANN UNIV 5-8 DVNC XI (MISCELLANEOUS) ×8 IMPLANT
SEAL XI 5MM-8MM UNIVERSAL (MISCELLANEOUS) ×4
SET TRI-LUMEN FLTR TB AIRSEAL (TUBING) ×3 IMPLANT
SHEET LAVH (DRAPES) ×3 IMPLANT
SOLUTION ELECTROLUBE (MISCELLANEOUS) ×3 IMPLANT
SURGIFLO W/THROMBIN 8M KIT (HEMOSTASIS) IMPLANT
SUT MNCRL AB 4-0 PS2 18 (SUTURE) ×6 IMPLANT
SUT VIC AB 0 CT1 27 (SUTURE) ×3
SUT VIC AB 0 CT1 27XBRD ANTBC (SUTURE) ×2 IMPLANT
SYR 10ML LL (SYRINGE) ×3 IMPLANT
SYR 50ML LL SCALE MARK (SYRINGE) ×3 IMPLANT
TOWEL OR 17X26 10 PK STRL BLUE (TOWEL DISPOSABLE) ×6 IMPLANT
TOWEL OR NON WOVEN STRL DISP B (DISPOSABLE) ×3 IMPLANT
TRAP SPECIMEN MUCOUS 40CC (MISCELLANEOUS) IMPLANT
TRAY FOLEY W/METER SILVER 16FR (SET/KITS/TRAYS/PACK) ×3 IMPLANT
TRAY LAPAROSCOPIC (CUSTOM PROCEDURE TRAY) ×3 IMPLANT
TROCAR BLADELESS OPT 5 100 (ENDOMECHANICALS) ×3 IMPLANT
UNDERPAD 30X30 (UNDERPADS AND DIAPERS) ×3 IMPLANT
UNDERPAD 30X30 INCONTINENT (UNDERPADS AND DIAPERS) ×3 IMPLANT
WATER STERILE IRR 1500ML POUR (IV SOLUTION) ×6 IMPLANT

## 2017-01-04 NOTE — Progress Notes (Signed)
Assessment unchanged.  Scripts were given per MD order.  All questions pertaining to D/C we answered.  Pt was D/C'd via wheelchair and accompanied by RN.  Roselind Rily

## 2017-01-04 NOTE — Discharge Instructions (Signed)
01/04/2017  Return to work: 4 weeks  Activity: 1. Be up and out of the bed during the day.  Take a nap if needed.  You may walk up steps but be careful and use the hand rail.  Stair climbing will tire you more than you think, you may need to stop part way and rest.   2. No lifting or straining for 6 weeks.  3. No driving for 1 weeks.  Do Not drive if you are taking narcotic pain medicine.  4. Shower daily.  Use soap and water on your incision and pat dry; don't rub.   5. No sexual activity and nothing in the vagina for 8 weeks.  Medications:  - Take ibuprofen and tylenol first line for pain control. Take these regularly (every 8 hours) to decrease the build up of pain.  -  You can alternate taking Tylenol and Ibuprofen, each every 3 hours as these medications do not interract with eachother, however, do not take more than 4000mg  of Tylenol in a day, or more than 2400mg  of ibuprofen in a day.   - While taking percocet you should take sennakot every night to reduce the likelihood of constipation. If this causes diarrhea, stop its use.  Diet: 1. Low sodium Heart Healthy Diet is recommended.  2. It is safe to use a laxative if you have difficulty moving your bowels.   Wound Care: 1. Keep clean and dry.  Shower daily.  Reasons to call the Doctor:   Fever - Oral temperature greater than 100.4 degrees Fahrenheit  Foul-smelling vaginal discharge  Difficulty urinating  Nausea and vomiting  Increased pain at the site of the incision that is unrelieved with pain medicine.  Difficulty breathing with or without chest pain  New calf pain especially if only on one side  Sudden, continuing increased vaginal bleeding with or without clots.   Follow-up: 1. See Everitt Amber in 3 weeks.  Contacts: For questions or concerns you should contact:  Dr. Everitt Amber at [phone]  or at [place]

## 2017-01-04 NOTE — Anesthesia Postprocedure Evaluation (Addendum)
Anesthesia Post Note  Patient: Angela Mays  Procedure(s) Performed: Procedure(s) (LRB): XI ROBOTIC ASSISTED TOTAL HYSTERECTOMY WITH BILATERAL SALPINGO OOPHORECTOMY (Bilateral) SENTINEL LYMPH NODE BIOPSY (N/A)  Patient location during evaluation: PACU Anesthesia Type: General Level of consciousness: awake and alert Pain management: pain level controlled Vital Signs Assessment: post-procedure vital signs reviewed and stable Respiratory status: spontaneous breathing, nonlabored ventilation, respiratory function stable and patient connected to nasal cannula oxygen Cardiovascular status: blood pressure returned to baseline and stable Postop Assessment: no signs of nausea or vomiting Anesthetic complications: no       Last Vitals:  Vitals:   01/04/17 0921 01/04/17 0930  BP: (!) 146/84 (!) 150/80  Pulse: 69 65  Resp: (!) 8 16  Temp: 36.8 C     Last Pain:  Vitals:   01/04/17 0557  TempSrc: Oral                 Westlynn Fifer S

## 2017-01-04 NOTE — Discharge Summary (Signed)
Patient successfully voiding, tolerating PO, normal vitals. Desires discharge to home on POD 0 in order to avoid snow storm.  Discharge provided given meeting criteria.  Donaciano Eva, MD

## 2017-01-04 NOTE — Anesthesia Procedure Notes (Signed)
Procedure Name: Intubation Date/Time: 01/04/2017 7:32 AM Performed by: Lind Covert Pre-anesthesia Checklist: Patient identified, Emergency Drugs available, Suction available, Patient being monitored and Timeout performed Patient Re-evaluated:Patient Re-evaluated prior to inductionOxygen Delivery Method: Circle system utilized Preoxygenation: Pre-oxygenation with 100% oxygen Intubation Type: IV induction Laryngoscope Size: Mac and 3 Grade View: Grade I Tube type: Oral Tube size: 7.0 mm Number of attempts: 1 Airway Equipment and Method: Stylet Placement Confirmation: ETT inserted through vocal cords under direct vision,  positive ETCO2 and breath sounds checked- equal and bilateral Secured at: 22 cm Tube secured with: Tape Dental Injury: Teeth and Oropharynx as per pre-operative assessment

## 2017-01-04 NOTE — H&P (Signed)
Consult was requested by Dr. Matthew Saras for the evaluation of Angela Mays 66 y.o. female  CC:     Chief Complaint  Patient presents with  . endometrial carcinoma    Assessment/Plan:  [name]  is a 66 y.o.  year old with FIGO grade 1 endometrioid endometrial cancer, obesity and type II DM (poorly controlled).  A detailed discussion was held with the patient and her family with regard to to her endometrial cancer diagnosis. We discussed the standard management options for uterine cancer which includes surgery followed possibly by adjuvant therapy depending on the results of surgery. The options for surgical management include a hysterectomy and removal of the tubes and ovaries possibly with removal of pelvic and para-aortic lymph nodes.If feasible, a minimally invasive approach including a robotic hysterectomy or laparoscopic hysterectomy have benefits including shorter hospital stay, recovery time and better wound healing than with open surgery. The patient has been counseled about these surgical options and the risks of surgery in general including infection, bleeding, damage to surrounding structures (including bowel, bladder, ureters, nerves or vessels), and the postoperative risks of PE/ DVT, and lymphedema. I extensively reviewed the additional risks of robotic hysterectomy including possible need for conversion to open laparotomy.  I discussed positioning during surgery of trendelenberg and risks of minor facial swelling and care we take in preoperative positioning.  After counseling and consideration of her options, she desires to proceed with robotic assisted total hysterectomy with bilateral sapingo-oophorectomy and SLN biopsy.   She will be seen by anesthesia for preoperative clearance and discussion of postoperative pain management.  She was given the opportunity to ask questions, which were answered to her satisfaction, and she is agreement with the above mentioned plan of  care.  I discussed that she is at increased risk for complications due to her morbid obesity and diabetes. We will check HbA1C today. She is due to see her endocrinologist on the 18th to discuss optimization. I discussed the importance of compliance with meds perioperatively to reduce complication risk.  She requested a delay in the surgery until mid January. She has been treated with megace since that time.  HPI: Angela Mays is a very pleasant 66 year old woman who is seen in consultation at the request of Dr Matthew Saras for grade 1 endometrial cancer.  The patient reports postmenopausal bleeding that began in late September 2017. It was very light. She saw Dr Matthew Saras and on 09/22/16 she had a sonohysterogram which showed a uterus measuring 7.5x4.2x5.9cm with a 83mm endometrial stripe. She was taken to the OR on 11/02/16 and a hysteroscopy D&C was performed with myosure resection of the the polyp. It showed extensive FIGO grade 1 endometrial cancer.  The patient reports having a sister with breast cancer in there 65's and a paternal grandfather with colon cancer.  She has had only a prior tubal ligation.  She is morbidly obese. Her BMI is 40kg/m2.  She has poorly controlled DM secondary to admitted noncompliance with meds that give her diarrhea.   Current Meds:      Outpatient Encounter Prescriptions as of 11/22/2016  Medication Sig  . glipiZIDE (GLUCOTROL XL) 2.5 MG 24 hr tablet Take 2 mg by mouth daily with breakfast.  . metFORMIN (GLUCOPHAGE) 500 MG tablet Take by mouth 2 (two) times daily with a meal.  . augmented betamethasone dipropionate (DIPROLENE AF) 0.05 % cream Apply topically 2 (two) times daily. (Patient not taking: Reported on 11/22/2016)  . meloxicam (MOBIC) 15  MG tablet Take 1 tablet (15 mg total) by mouth daily. (Patient not taking: Reported on 11/22/2016)   No facility-administered encounter medications on file as of 11/22/2016.     Allergy:      Allergies   Allergen Reactions  . Flagyl [Metronidazole]     Social Hx:   Social History        Social History  . Marital status: Single    Spouse name: N/A  . Number of children: N/A  . Years of education: N/A      Occupational History  . Not on file.       Social History Main Topics  . Smoking status: Never Smoker  . Smokeless tobacco: Never Used  . Alcohol use Not on file  . Drug use: Unknown  . Sexual activity: Not on file   Other Topics Concern  . Not on file      Social History Narrative  . No narrative on file    Past Surgical Hx:       Past Surgical History:  Procedure Laterality Date  . COLONOSCOPY    . DILATATION & CURETTAGE/HYSTEROSCOPY WITH MYOSURE N/A 11/02/2016   Procedure: DILATATION & CURETTAGE/HYSTEROSCOPY WITH MYOSURE;  Surgeon: Molli Posey, MD;  Location: [facility];  Service: Gynecology;  Laterality: N/A;  . DILATION AND CURETTAGE OF UTERUS      Past Medical Hx:      Past Medical History:  Diagnosis Date  . Diabetes mellitus without complication ([place])   . Leg cramps    and feet  . Pneumonia   . Skin rash 10/21/2016    Past Gynecological History:  SVD x 2 No LMP recorded. Patient is postmenopausal.  Family Hx: History reviewed. No pertinent family history.  Review of Systems:  Constitutional  Feels well,    ENT Normal appearing ears and nares bilaterally Skin/Breast  No rash, sores, jaundice, itching, dryness Cardiovascular  No chest pain, shortness of breath, or edema  Pulmonary  No cough or wheeze.  Gastro Intestinal  No nausea, vomitting, or diarrhoea. No bright red blood per rectum, no abdominal pain, change in bowel movement, or constipation.  Genito Urinary  No frequency, urgency, dysuria, + postmenopausal bleeding Musculo Skeletal  No myalgia, arthralgia, joint swelling or pain  Neurologic  No weakness, numbness, change in gait,  Psychology  No depression, anxiety, insomnia.   Vitals:  Blood  pressure (!) 161/77, pulse 68, temperature 97.7 F (36.5 C), temperature source Oral, resp. rate 17, height 5\' 4"  (1.626 m), weight 202 lb (91.6 kg), SpO2 99 %.  Physical Exam: WD in NAD Neck  Supple NROM, without any enlargements.  Lymph Node Survey No cervical supraclavicular or inguinal adenopathy Cardiovascular  Pulse normal rate, regularity and rhythm. S1 and S2 normal.  Lungs  Clear to auscultation bilateraly, without wheezes/crackles/rhonchi. Good air movement.  Skin  No rash/lesions/breakdown  Psychiatry  Alert and oriented to person, place, and time  Abdomen  Normoactive bowel sounds, abdomen soft, non-tender and obese without evidence of hernia.  Back No CVA tenderness Genito Urinary  Vulva/vagina: Normal external female genitalia.   No lesions. No discharge or bleeding.             Bladder/urethra:  No lesions or masses, well supported bladder             Vagina: normal             Cervix: Normal appearing, no lesions.  Uterus:  Small, mobile, no parametrial involvement or nodularity.             Adnexa: no palpable masses. Rectal  deferred Extremities  No bilateral cyanosis, clubbing or edema.   Donaciano Eva, MD

## 2017-01-04 NOTE — Transfer of Care (Signed)
Immediate Anesthesia Transfer of Care Note  Patient: Angela Mays  Procedure(s) Performed: Procedure(s): XI ROBOTIC ASSISTED TOTAL HYSTERECTOMY WITH BILATERAL SALPINGO OOPHORECTOMY (Bilateral) SENTINEL LYMPH NODE BIOPSY (N/A)  Patient Location: PACU  Anesthesia Type:General  Level of Consciousness: sedated  Airway & Oxygen Therapy: Patient Spontanous Breathing and Patient connected to face mask oxygen  Post-op Assessment: Report given to RN and Post -op Vital signs reviewed and stable  Post vital signs: Reviewed and stable  Last Vitals:  Vitals:   01/04/17 0557  BP: (!) 152/78  Pulse: 80  Resp: 18  Temp: 36.7 C    Last Pain:  Vitals:   01/04/17 0557  TempSrc: Oral      Patients Stated Pain Goal: 3 ([id number])  Complications: No apparent anesthesia complications

## 2017-01-04 NOTE — Op Note (Signed)
OPERATIVE NOTE 01/04/17  Surgeon: Donaciano Eva   Assistants: Dr Lahoma Crocker (an MD assistant was necessary for tissue manipulation, management of robotic instrumentation, retraction and positioning due to the complexity of the case and hospital policies).   Anesthesia: General endotracheal anesthesia  ASA Class: 3   Pre-operative Diagnosis: grade 1 endometrial cancer  Post-operative Diagnosis: same  Operation: Robotic-assisted laparoscopic total hysterectomy with bilateral salpingoophorectomy with sentinel lymph node mapping and biopsy  Surgeon: Donaciano Eva  Assistant Surgeon: Lahoma Crocker MD  Anesthesia: GET  Urine Output: 300cc  Operative Findings:  : 6cm normal appearing uterus, right hydrosalpinx. Surgically interrupted tubes, normal ovaries. No suspicious nodes.  Estimated Blood Loss:  <20cc      Total IV Fluids: 700 ml         Specimens: right obturator SLN, left external iliac SLN, uterus, cervix, bilateral tubes and ovaries         Complications:  None; patient tolerated the procedure well.         Disposition: PACU - hemodynamically stable.  Procedure Details  The patient was seen in the Holding Room. The risks, benefits, complications, treatment options, and expected outcomes were discussed with the patient.  The patient concurred with the proposed plan, giving informed consent.  The site of surgery properly noted/marked. The patient was identified as Angela Mays and the procedure verified as a Robotic-assisted hysterectomy with bilateral salpingo oophorectomy. A Time Out was held and the above information confirmed.  After induction of anesthesia, the patient was draped and prepped in the usual sterile manner. Pt was placed in supine position after anesthesia and draped and prepped in the usual sterile manner. The abdominal drape was placed after the CholoraPrep had been allowed to dry for 3 minutes.  Her arms were tucked to her side  with all appropriate precautions.  The shoulders were stabilized with padded shoulder blocks applied to the acromium processes.  The patient was placed in the semi-lithotomy position in [place].  The perineum was prepped with Betadine. The patient was then prepped. Foley catheter was placed.  A sterile speculum was placed in the vagina.  The cervix was grasped with a single-tooth tenaculum and dilated with Kennon Rounds dilators.  1mg  total of ICG was injected into the cervical stroma at 2 and 9 o'clock at a 40mm depth (concentration 0..5mg /ml). The ZUMI uterine manipulator with a medium colpotomizer ring was placed without difficulty.  A pneum occluder balloon was placed over the manipulator.  OG tube placement was confirmed and to suction.   Next, a 5 mm skin incision was made 1 cm below the subcostal margin in the midclavicular line.  The 5 mm Optiview port and scope was used for direct entry.  Opening pressure was under 10 mm CO2.  The abdomen was insufflated and the findings were noted as above.   At this point and all points during the procedure, the patient's intra-abdominal pressure did not exceed 15 mmHg. Next, a 10 mm skin incision was made in the umbilicus and a right and left port was placed about 10 cm lateral to the robot port on the right and left side.  A fourth arm was placed in the left lower quadrant 2 cm above and superior and medial to the anterior superior iliac spine.  All ports were placed under direct visualization.  The patient was placed in steep Trendelenburg.  Bowel was folded away into the upper abdomen.  The robot was docked in the normal manner.  The right and left peritoneum were opened parallel to the IP ligament to open the retroperitoneal spaces bilaterally. The SLN mapping was performed in bilateral pelvic basins. The para rectal and paravesical spaces were opened up. Lymphatic channels were identified travelling to the following visualized sentinel lymph node's: right  obturator SLN and left external iliac SLN. These SLN's were separated from their surrounding lymphatic tissue, removed and sent for permanent pathology.  The hysterectomy was started after the round ligament on the right side was incised and the retroperitoneum was entered and the pararectal space was developed.  The ureter was noted to be on the medial leaf of the broad ligament.  The peritoneum above the ureter was incised and stretched and the infundibulopelvic ligament was skeletonized, cauterized and cut.  The posterior peritoneum was taken down to the level of the KOH ring.  The anterior peritoneum was also taken down.  The bladder flap was created to the level of the KOH ring.  The uterine artery on the right side was skeletonized, cauterized and cut in the normal manner.  A similar procedure was performed on the left.  The colpotomy was made and the uterus, cervix, bilateral ovaries and tubes were amputated and delivered through the vagina.  Pedicles were inspected and excellent hemostasis was achieved.    The colpotomy at the vaginal cuff was closed with Vicryl on a CT1 needle in a running manner.  Irrigation was used and excellent hemostasis was achieved.  At this point in the procedure was completed.  Robotic instruments were removed under direct visulaization.  The robot was undocked. The 10 mm ports were closed with Vicryl on a UR-5 needle and the fascia was closed with 0 Vicryl on a UR-5 needle.  The skin was closed with 4-0 Vicryl in a subcuticular manner.  Dermabond was applied.  Sponge, lap and needle counts correct x 2.  The patient was taken to the recovery room in stable condition.  The vagina was swabbed with  minimal bleeding noted.   All instrument and needle counts were correct x  3.   The patient was transferred to the recovery room in a stable condition.  Donaciano Eva, MD

## 2017-01-04 NOTE — Anesthesia Preprocedure Evaluation (Signed)
Anesthesia Evaluation  Patient identified by MRN, date of birth, ID band Patient awake    Reviewed: Allergy & Precautions, NPO status , Patient's Chart, lab work & pertinent test results  History of Anesthesia Complications (+) PONV  Airway Mallampati: II  TM Distance: >3 FB Neck ROM: Full    Dental no notable dental hx.    Pulmonary neg pulmonary ROS,    Pulmonary exam normal breath sounds clear to auscultation       Cardiovascular negative cardio ROS Normal cardiovascular exam Rhythm:Regular Rate:Normal     Neuro/Psych negative neurological ROS  negative psych ROS   GI/Hepatic negative GI ROS, Neg liver ROS,   Endo/Other  diabetes  Renal/GU negative Renal ROS  negative genitourinary   Musculoskeletal negative musculoskeletal ROS (+)   Abdominal   Peds negative pediatric ROS (+)  Hematology negative hematology ROS (+)   Anesthesia Other Findings   Reproductive/Obstetrics negative OB ROS                             Anesthesia Physical Anesthesia Plan  ASA: II  Anesthesia Plan: General   Post-op Pain Management:    Induction: Intravenous  Airway Management Planned: Oral ETT  Additional Equipment:   Intra-op Plan:   Post-operative Plan: Extubation in OR  Informed Consent: I have reviewed the patients History and Physical, chart, labs and discussed the procedure including the risks, benefits and alternatives for the proposed anesthesia with the patient or authorized representative who has indicated his/her understanding and acceptance.   Dental advisory given  Plan Discussed with: CRNA and Surgeon  Anesthesia Plan Comments:         Anesthesia Quick Evaluation

## 2017-01-06 ENCOUNTER — Encounter: Payer: Medicare Other | Admitting: Gastroenterology

## 2017-01-06 NOTE — Discharge Summary (Signed)
Patient was admitted on 01/04/17 for robotic hysterectomy, BSO and SLN biopsy for endometrial cancer.  She was meeting discharge criteria the same day and desired discharge.  Foley was removed and she voided. Pain was well controlled on non-narcotics. She was tolerating PO.  Donaciano Eva, MD

## 2017-01-11 ENCOUNTER — Telehealth: Payer: Self-pay | Admitting: Gynecologic Oncology

## 2017-01-11 NOTE — Telephone Encounter (Signed)
Post op telephone call to check patient status.  Patient describes expected post operative status.  Adequate PO intake reported.  Bowels and bladder functioning without difficulty.  Pain minimal.  Reportable signs and symptoms reviewed.  Follow up appt confirmed.  Final path reviewed with Dr. Serita Grit recommendations for no adjuvant therapy needed.  Advised to call for any needs.  She is going to call next week to get a return to work note.

## 2017-01-21 ENCOUNTER — Encounter: Payer: Self-pay | Admitting: Gynecologic Oncology

## 2017-01-26 ENCOUNTER — Ambulatory Visit: Payer: Medicare Other | Admitting: Internal Medicine

## 2017-01-27 NOTE — Progress Notes (Signed)
Consult Note: Gyn-Onc  Consult was requested by Dr. Matthew Saras for the evaluation of Angela Mays 66 y.o. female  CC:  Chief Complaint  Patient presents with  . endometrial cancer    Assessment/Plan:  Angela Mays  is a 66 y.o.  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  Pathology revealed low risk factors for recurrence, therefore no adjuvant therapy is recommended according to NCCN guidelines.  I discussed risk for recurrence and typical symptoms encouraged her to notify us of these should they develop between visits.  I recommend she have follow-up every 6 months for 5 years in accordance with NCCN guidelines. Those visits should include symptom assessment, physical exam and pelvic examination. Pap smears are not indicated or recommended in the routine surveillance of endometrial cancer.  We will refer her for genetics testing due to the IHC abnormalities identified to rule out Lynch syndrome.   HPI: Angela Mays is a very pleasant 66 year old woman who is seen in consultation at the request of Dr Matthew Saras for grade 1 endometrial cancer.  The patient reports postmenopausal bleeding that began in late September 2017. It was very light. She saw Dr Matthew Saras and on 09/22/16 she had a sonohysterogram which showed a uterus measuring 7.5x4.2x5.9cm with a 45m endometrial stripe. She was taken to the OR on 11/02/16 and a hysteroscopy D&C was performed with myosure resection of the the polyp. It showed extensive FIGO grade 1 endometrial cancer.  The patient reports having a sister with breast cancer in there 745'sand a paternal grandfather with colon cancer.  She has had only a prior tubal ligation.  She is morbidly obese. Her BMI is 40kg/m2.  She has poorly controlled DM secondary to admitted noncompliance with meds that give her diarrhea.   Interval Hx:  On 01/04/17 she underwent  robotic assisted total hysterectomy, BSO and SLN biopsy for her endometrial cancer. Pathology showed a 5cm tumor with 0.6 of 2.6 (25%) myometrial invasion, no LVSI, grade 1 tumor and no cervical or adnexal involvement. The nodes were negative. IHC testing of the tumor revealed abnormalities in mismatch repair genes MLH1 and PMS2.   Since surgery she has been doing well with no complaints.  Current Meds:  Outpatient Encounter Prescriptions as of 01/28/2017  Medication Sig  . glimepiride (AMARYL) 2 MG tablet Take 1 tablet (2 mg total) by mouth 2 (two) times daily before a meal.  . ibuprofen (ADVIL,MOTRIN) 800 MG tablet Take 1 tablet (800 mg total) by mouth every 8 (eight) hours as needed (mild pain).  . metFORMIN (GLUCOPHAGE-XR) 500 MG 24 hr tablet Take 1 tablet (500 mg total) by mouth daily with breakfast. (Patient taking differently: Take 1,000 mg by mouth 2 (two) times daily. )  . senna (SENOKOT) 8.6 MG TABS tablet Take 1 tablet (8.6 mg total) by mouth at bedtime.  . [DISCONTINUED] megestrol (MEGACE) 40 MG tablet Take 1 tablet (40 mg total) by mouth 2 (two) times daily. (Patient not taking: Reported on 01/28/2017)   No facility-administered encounter medications on file as of 01/28/2017.     Allergy:  Allergies  Allergen Reactions  . Flagyl [Metronidazole] Rash    Social Hx:   Social History   Social History  . Marital status: Single    Spouse name: N/A  . Number of children: N/A  . Years of education: N/A   Occupational History  .  Not on file.   Social History Main Topics  . Smoking status: Never Smoker  . Smokeless tobacco: Never Used  . Alcohol use No  . Drug use: No  . Sexual activity: Not on file   Other Topics Concern  . Not on file   Social History Narrative  . No narrative on file    Past Surgical Hx:  Past Surgical History:  Procedure Laterality Date  . COLONOSCOPY  AGE 42  . DILATATION & CURETTAGE/HYSTEROSCOPY WITH MYOSURE N/A 11/02/2016   Procedure:  DILATATION & CURETTAGE/HYSTEROSCOPY WITH MYOSURE;  Surgeon: Molli Posey, MD;  Location: [facility];  Service: Gynecology;  Laterality: N/A;  . DILATION AND CURETTAGE OF UTERUS    . LYMPH NODE BIOPSY N/A 01/04/2017   Procedure: SENTINEL LYMPH NODE BIOPSY;  Surgeon: Everitt Amber, MD;  Location: [facility];  Service: Gynecology;  Laterality: N/A;  . ROBOTIC ASSISTED TOTAL HYSTERECTOMY WITH BILATERAL SALPINGO OOPHERECTOMY Bilateral 01/04/2017   Procedure: XI ROBOTIC ASSISTED TOTAL HYSTERECTOMY WITH BILATERAL SALPINGO OOPHORECTOMY;  Surgeon: Everitt Amber, MD;  Location: [facility];  Service: Gynecology;  Laterality: Bilateral;  . TUBAL LIGATION  YRS AGO    Past Medical Hx:  Past Medical History:  Diagnosis Date  . Cancer [facility]    ENDOMETRIAL CANCER  . Diabetes mellitus without complication ([facility])    TYPE 2  . Leg cramps    and feet OCC  . Pneumonia YRS AGO  . PONV (postoperative nausea and vomiting)    NAUSEA  . Skin rash 10/21/2016   CONTACT DERMITITIS OCC    Past Gynecological History:  SVD x 2 No LMP recorded. Patient is postmenopausal.  Family Hx:  Family History  Problem Relation Age of Onset  . Heart Problems Father   . Cervical cancer Sister   . High blood pressure Son   . High blood pressure Son   . Breast cancer Sister     Review of Systems:  Constitutional  Feels well,    ENT Normal appearing ears and nares bilaterally Skin/Breast  No rash, sores, jaundice, itching, dryness Cardiovascular  No chest pain, shortness of breath, or edema  Pulmonary  No cough or wheeze.  Gastro Intestinal  No nausea, vomitting, or diarrhoea. No bright red blood per rectum, no abdominal pain, change in bowel movement, or constipation.  Genito Urinary  No frequency, urgency, dysuria, no bleeding Musculo Skeletal  No myalgia, arthralgia, joint swelling or pain  Neurologic  No weakness, numbness, change in gait,  Psychology  No depression, anxiety, insomnia.   Vitals:  Blood pressure (!)  153/84, pulse 83, temperature 98.2 F (36.8 C), temperature source Oral, resp. rate 18, height '5\' 4"'  (1.626 m), weight 195 lb 9.6 oz (88.7 kg), SpO2 100 %.  Physical Exam: WD in NAD Neck  Supple NROM, without any enlargements.  Lymph Node Survey No cervical supraclavicular or inguinal adenopathy Cardiovascular  Pulse normal rate, regularity and rhythm. S1 and S2 normal.  Lungs  Clear to auscultation bilateraly, without wheezes/crackles/rhonchi. Good air movement.  Skin  No rash/lesions/breakdown  Psychiatry  Alert and oriented to person, place, and time  Abdomen  Normoactive bowel sounds, abdomen soft, non-tender and obese without evidence of hernia. Incisions well healed. Back No CVA tenderness Genito Urinary : vaginal cuff healing normally, sutures present Rectal  deferred Extremities  No bilateral cyanosis, clubbing or edema.   20 minutes of direct face to face counseling time was spent with the patient. This included discussion about prognosis, therapy recommendations and postoperative side  effects and are beyond the scope of routine postoperative care.   Donaciano Eva, MD  01/28/2017, 2:21 PM

## 2017-01-28 ENCOUNTER — Ambulatory Visit: Payer: Medicare Other | Attending: Gynecologic Oncology | Admitting: Gynecologic Oncology

## 2017-01-28 ENCOUNTER — Encounter: Payer: Self-pay | Admitting: Gynecologic Oncology

## 2017-01-28 VITALS — BP 153/84 | HR 83 | Temp 98.2°F | Resp 18 | Ht 64.0 in | Wt 195.6 lb

## 2017-01-28 DIAGNOSIS — Z7984 Long term (current) use of oral hypoglycemic drugs: Secondary | ICD-10-CM | POA: Diagnosis not present

## 2017-01-28 DIAGNOSIS — Z8049 Family history of malignant neoplasm of other genital organs: Secondary | ICD-10-CM | POA: Insufficient documentation

## 2017-01-28 DIAGNOSIS — Z90722 Acquired absence of ovaries, bilateral: Secondary | ICD-10-CM | POA: Diagnosis not present

## 2017-01-28 DIAGNOSIS — Z8249 Family history of ischemic heart disease and other diseases of the circulatory system: Secondary | ICD-10-CM | POA: Insufficient documentation

## 2017-01-28 DIAGNOSIS — C541 Malignant neoplasm of endometrium: Secondary | ICD-10-CM | POA: Diagnosis not present

## 2017-01-28 DIAGNOSIS — N950 Postmenopausal bleeding: Secondary | ICD-10-CM | POA: Insufficient documentation

## 2017-01-28 DIAGNOSIS — Z9114 Patient's other noncompliance with medication regimen: Secondary | ICD-10-CM | POA: Insufficient documentation

## 2017-01-28 DIAGNOSIS — E119 Type 2 diabetes mellitus without complications: Secondary | ICD-10-CM | POA: Diagnosis not present

## 2017-01-28 DIAGNOSIS — R894 Abnormal immunological findings in specimens from other organs, systems and tissues: Secondary | ICD-10-CM

## 2017-01-28 DIAGNOSIS — Z6841 Body Mass Index (BMI) 40.0 and over, adult: Secondary | ICD-10-CM | POA: Insufficient documentation

## 2017-01-28 DIAGNOSIS — Z800 Family history of malignant neoplasm of digestive organs: Secondary | ICD-10-CM | POA: Diagnosis not present

## 2017-01-28 DIAGNOSIS — Z881 Allergy status to other antibiotic agents status: Secondary | ICD-10-CM | POA: Diagnosis not present

## 2017-01-28 DIAGNOSIS — Z90710 Acquired absence of both cervix and uterus: Secondary | ICD-10-CM | POA: Diagnosis not present

## 2017-01-28 DIAGNOSIS — Z803 Family history of malignant neoplasm of breast: Secondary | ICD-10-CM | POA: Insufficient documentation

## 2017-01-28 NOTE — Patient Instructions (Signed)
Please call our office in June to schedule a follow up appointment in August 2018. Our office will be contacting you regarding a Genetic Counseling appointment.

## 2017-02-02 ENCOUNTER — Encounter: Payer: Self-pay | Admitting: Internal Medicine

## 2017-02-02 ENCOUNTER — Ambulatory Visit (INDEPENDENT_AMBULATORY_CARE_PROVIDER_SITE_OTHER): Payer: Medicare Other | Admitting: Internal Medicine

## 2017-02-02 VITALS — BP 124/82 | HR 75 | Ht 63.5 in | Wt 196.0 lb

## 2017-02-02 DIAGNOSIS — E1165 Type 2 diabetes mellitus with hyperglycemia: Secondary | ICD-10-CM | POA: Diagnosis not present

## 2017-02-02 MED ORDER — EMPAGLIFLOZIN 25 MG PO TABS: 25.0000 mg | ORAL_TABLET | Freq: Every day | ORAL | 5 refills | Status: DC

## 2017-02-02 NOTE — Progress Notes (Signed)
Patient ID: Angela Mays, female   DOB: January 14, 1951, 66 y.o.   MRN: [id number]   HPI: Angela Mays is a 66 y.o.-year-old female, returning for f/u for DM2, dx in 09/2014, non-insulin-dependent, uncontrolled, without long term complications. Last visit 2 mo ago.  She had hysterectomy in 12/2016.  Last hemoglobin A1c was: Lab Results  Component Value Date   HGBA1C 8.2 (H) 11/22/2016   Pt is on a regimen of: - Metformin XR 1000 mg twice a day (after meals). - Glimepiride 2 mg before breakfast and dinner (15-30 min before meals).  If you have a particularly larger meal, you may take 2 tablets before that meal. At last visit she told me: "I am not compliant with my meds" - Forgetting many doses or takes them after she eats  Pt checks her sugars 1x a day: - am: 144-184, 201 >> 84 (fasting), 142-200, 263 (chips) - 2h after b'fast: n/c - before lunch: n/c >> 141-206, 254 - 2h after lunch: n/c - before dinner: n/c - 2h after dinner: 178, 292 - bedtime: n/c - nighttime: n/c >> 95-205  Glucometer: One Touch Ultra mini  Pt's meals are: - Breakfast: oatmeal or PB sandwich or bacon and eggs or cereals or skips during the week - Lunch: sandwich or spaghetti or fast foods - Dinner: largest - meat + veggies + starch - Snacks: 2-3: chocolate, potato chips,  M&M Sweet tea after lunch if eating out, whole milk.  - no CKD, last BUN/creatinine:  Lab Results  Component Value Date   BUN 13 12/30/2016   BUN 15 10/21/2016   CREATININE 0.70 12/30/2016   CREATININE 0.73 10/21/2016   - last set of lipids: No results found for: CHOL, HDL, LDLCALC, LDLDIRECT, TRIG, CHOLHDL - last eye exam was in 2016. No DR.  - no numbness and tingling in her feet.  Of note, patient has a new diagnosis of endometrial cancer >> will have hysterectomy in 12/2016.  ROS: Constitutional: no weight gain/loss, no fatigue, no subjective hyperthermia/hypothermia Eyes: no blurry vision, no xerophthalmia ENT: no sore  throat, no nodules palpated in throat, no dysphagia/odynophagia, no hoarseness Cardiovascular: no CP/SOB/palpitations/leg swelling Respiratory: no cough/SOB Gastrointestinal: no N/V/D/C Musculoskeletal: + muscle/+ joint aches Skin: no rashes Neurological: no tremors/numbness/tingling/dizziness  I reviewed pt's medications, allergies, PMH, social hx, family hx, and changes were documented in the history of present illness. Otherwise, unchanged from my initial visit note.  Past Medical History:  Diagnosis Date  . Cancer [facility])    ENDOMETRIAL CANCER  . Diabetes mellitus without complication (HCC)    TYPE 2  . Leg cramps    and feet OCC  . Pneumonia YRS AGO  . PONV (postoperative nausea and vomiting)    NAUSEA  . Skin rash 10/21/2016   CONTACT DERMITITIS OCC   Past Surgical History:  Procedure Laterality Date  . COLONOSCOPY  AGE 66  . DILATATION & CURETTAGE/HYSTEROSCOPY WITH MYOSURE N/A 11/02/2016   Procedure: DILATATION & CURETTAGE/HYSTEROSCOPY WITH MYOSURE;  Surgeon: Molli Posey, MD;  Location: [facility];  Service: Gynecology;  Laterality: N/A;  . DILATION AND CURETTAGE OF UTERUS    . LYMPH NODE BIOPSY N/A 01/04/2017   Procedure: SENTINEL LYMPH NODE BIOPSY;  Surgeon: Everitt Amber, MD;  Location: [facility];  Service: Gynecology;  Laterality: N/A;  . ROBOTIC ASSISTED TOTAL HYSTERECTOMY WITH BILATERAL SALPINGO OOPHERECTOMY Bilateral 01/04/2017   Procedure: XI ROBOTIC ASSISTED TOTAL HYSTERECTOMY WITH BILATERAL SALPINGO OOPHORECTOMY;  Surgeon: Everitt Amber, MD;  Location: [facility];  Service: Gynecology;  Laterality: Bilateral;  . TUBAL LIGATION  YRS AGO   Social History   Social History  . Marital status: Single    Spouse name: N/A  . Number of children: 2   Occupational History  . Glass blower/designer    Social History Main Topics  . Smoking status: Never Smoker  . Smokeless tobacco: Never Used  . Alcohol use No  . Drug use: No   Current Outpatient Prescriptions on File Prior to Visit   Medication Sig Dispense Refill  . glimepiride (AMARYL) 2 MG tablet Take 1 tablet (2 mg total) by mouth 2 (two) times daily before a meal. 180 tablet 3  . ibuprofen (ADVIL,MOTRIN) 800 MG tablet Take 1 tablet (800 mg total) by mouth every 8 (eight) hours as needed (mild pain). 30 tablet 0  . metFORMIN (GLUCOPHAGE-XR) 500 MG 24 hr tablet Take 1 tablet (500 mg total) by mouth daily with breakfast. (Patient taking differently: Take 1,000 mg by mouth 2 (two) times daily. ) 360 tablet 3  . senna (SENOKOT) 8.6 MG TABS tablet Take 1 tablet (8.6 mg total) by mouth at bedtime. 120 each 0   No current facility-administered medications on file prior to visit.    Allergies  Allergen Reactions  . Flagyl [Metronidazole] Rash   Family History  Problem Relation Age of Onset  . Heart Problems Father   . Cervical cancer Sister   . High blood pressure Son   . High blood pressure Son   . Breast cancer Sister    PE: BP 124/82 (BP Location: Left Arm, Patient Position: Sitting)   Pulse 75   Ht 5' 3.5" (1.613 m)   Wt 196 lb (88.9 kg)   SpO2 98%   BMI 34.18 kg/m  Wt Readings from Last 3 Encounters:  02/02/17 196 lb (88.9 kg)  01/28/17 195 lb 9.6 oz (88.7 kg)  01/04/17 201 lb (91.2 kg)   Constitutional: overweight, in NAD Eyes: PERRLA, EOMI, no exophthalmos ENT: moist mucous membranes, no thyromegaly, no cervical lymphadenopathy Cardiovascular: RRR, No MRG Respiratory: CTA B Gastrointestinal: abdomen soft, NT, ND, BS+ Musculoskeletal: no deformities, strength intact in all 4 Skin: moist, warm, no rashes Neurological: no tremor with outstretched hands, DTR normal in all 4  ASSESSMENT: 1. DM2, non-insulin-dependent, uncontrolled, without long term complications, but with hyperglycemia  PLAN:  1. Patient with 2 years of uncontrolled diabetes, on oral antidiabetic regimen, which we adjusted at last visit. She has a h/o poor control due to noncompliance with the medications. She had mild diarrhea  from metformin >> we changed to the XR formulation at last visit and also moved Amarul before meals. Since last visit, she started to check her sugars at home >> they are better except when she had food indulgences. Will try to add an SGLT2 inh. - we discussed about possible SEs of this class of meds, which are: dizziness (advised to be careful when stands from sitting position), decreased BP - usually not < normal (BP today is not low), and fungal UTIs (advised to let me know if develops one).  - I suggested to:   Patient Instructions  Please continue: - Metformin XR1000 mg twice a day with meals - Glimepiride 2 mg before breakfast and dinner  If you have a particularly larger meal, you may take 2 tablets before that meal.  Please start Jardiance 25 mg daily before the first meal of the day.  Please return in 1.5 months with your sugar log.   -  continue checking sugars at different times of the day - check 1 time a day, rotating checks - advised for yearly eye exams >> she is due for another one - Return to clinic in 1.5 mo with sugar log   Philemon Kingdom, MD PhD [facility] Endocrinology

## 2017-02-02 NOTE — Patient Instructions (Addendum)
Please continue: - Metformin XR1000 mg twice a day with meals - Glimepiride 2 mg before breakfast and dinner  If you have a particularly larger meal, you may take 2 tablets before that meal.  Please start Jardiance 25 mg daily before the first meal of the day.  Please return in 1.5 months with your sugar log.

## 2017-02-03 ENCOUNTER — Telehealth: Payer: Self-pay | Admitting: *Deleted

## 2017-02-03 NOTE — Telephone Encounter (Signed)
Angela Mays at [facility] ref Claim# [id number] Confirming pt had surgery on 01/04/17/ Robotic assisted laparoscopic total hysterectomy with bilateral salpingoophorectomy with sentinal lymph node mapping and biopsy.  [facility] representative took information above and advised they will call with any additional questions.

## 2017-02-09 DIAGNOSIS — Z6834 Body mass index (BMI) 34.0-34.9, adult: Secondary | ICD-10-CM | POA: Diagnosis not present

## 2017-02-09 DIAGNOSIS — Z01419 Encounter for gynecological examination (general) (routine) without abnormal findings: Secondary | ICD-10-CM | POA: Diagnosis not present

## 2017-02-09 DIAGNOSIS — Z1231 Encounter for screening mammogram for malignant neoplasm of breast: Secondary | ICD-10-CM | POA: Diagnosis not present

## 2017-02-24 ENCOUNTER — Telehealth: Payer: Self-pay | Admitting: Internal Medicine

## 2017-02-24 ENCOUNTER — Other Ambulatory Visit: Payer: Self-pay

## 2017-02-24 MED ORDER — EMPAGLIFLOZIN 25 MG PO TABS: 25.0000 mg | ORAL_TABLET | Freq: Every day | ORAL | 1 refills | Status: DC

## 2017-02-24 NOTE — Telephone Encounter (Signed)
Submitted

## 2017-02-24 NOTE — Telephone Encounter (Signed)
Pt called in and requested a refill of her Jardiance to be refilled and sent to the [facility] for 90 day supply.

## 2017-03-06 ENCOUNTER — Telehealth: Payer: Self-pay | Admitting: Genetics

## 2017-03-06 ENCOUNTER — Encounter: Payer: Self-pay | Admitting: Genetics

## 2017-03-06 NOTE — Telephone Encounter (Signed)
Appt has been scheduled for the pt to see Vicente Males for genetic counseling on 4/5 at 3pm. Appt has agreed to the appt date and time. Letter mailed to the pt.

## 2017-03-09 ENCOUNTER — Encounter: Payer: Self-pay | Admitting: Internal Medicine

## 2017-03-24 ENCOUNTER — Ambulatory Visit (HOSPITAL_BASED_OUTPATIENT_CLINIC_OR_DEPARTMENT_OTHER): Payer: Medicare Other | Admitting: Genetics

## 2017-03-24 ENCOUNTER — Other Ambulatory Visit: Payer: Medicare Other

## 2017-03-24 DIAGNOSIS — Z800 Family history of malignant neoplasm of digestive organs: Secondary | ICD-10-CM

## 2017-03-24 DIAGNOSIS — Z803 Family history of malignant neoplasm of breast: Secondary | ICD-10-CM

## 2017-03-24 DIAGNOSIS — R978 Other abnormal tumor markers: Secondary | ICD-10-CM | POA: Diagnosis not present

## 2017-03-24 DIAGNOSIS — C541 Malignant neoplasm of endometrium: Secondary | ICD-10-CM

## 2017-03-24 DIAGNOSIS — Z7183 Encounter for nonprocreative genetic counseling: Secondary | ICD-10-CM | POA: Diagnosis not present

## 2017-03-24 DIAGNOSIS — Z808 Family history of malignant neoplasm of other organs or systems: Secondary | ICD-10-CM | POA: Diagnosis not present

## 2017-03-24 DIAGNOSIS — Z8051 Family history of malignant neoplasm of kidney: Secondary | ICD-10-CM

## 2017-03-25 ENCOUNTER — Encounter: Payer: Self-pay | Admitting: Genetics

## 2017-03-25 NOTE — Progress Notes (Signed)
REFERRING PROVIDER: Everitt Amber, MD [place], [place] [address]  PRIMARY PROVIDER:  Aretta Nip, MD  PRIMARY REASON FOR VISIT:  1. Endometrial cancer ([place])   2. Abnormal tumor markers     HISTORY OF PRESENT ILLNESS:   Angela Mays, a 66 y.o. female, was seen for a [facility] cancer genetics consultation at the request of Dr. Denman Mays due to a personal history of endometrial cancer and loss of MLH1 and PMS2 expression by [facility] analysis and family history of cancers.  Angela Mays presents to clinic today to discuss the possibility of a hereditary predisposition to cancer, genetic testing, and to further clarify her future cancer risks, as well as potential cancer risks for family members.   In November 2017, at the age of 19, Angela Mays was diagnosed with endometrial adenocarcinoma. This was treated with TAH/BSO on 01/04/2017. Immunohistochemical analysis for expression of the mismatch repair genes showed loss of MLH1 and PMS2, indicating an increased risk for Lynch syndrome. Microsatellite instability was high.  Methylation studies revealed that MLH1 hypermethylation was present. Angela Mays' last colonoscopy was performed at age 81. She states that she is in the process of scheduling one.  CANCER HISTORY:   No history exists.    Past Medical History:  Diagnosis Date  . Cancer [facility])    ENDOMETRIAL CANCER  . Diabetes mellitus without complication (HCC)    TYPE 2  . Leg cramps    and feet OCC  . Pneumonia YRS AGO  . PONV (postoperative nausea and vomiting)    NAUSEA  . Skin rash 10/21/2016   CONTACT DERMITITIS OCC    Past Surgical History:  Procedure Laterality Date  . COLONOSCOPY  AGE 46  . DILATATION & CURETTAGE/HYSTEROSCOPY WITH MYOSURE N/A 11/02/2016   Procedure: DILATATION & CURETTAGE/HYSTEROSCOPY WITH MYOSURE;  Surgeon: Molli Posey, MD;  Location: [facility];  Service: Gynecology;  Laterality: N/A;  . DILATION AND CURETTAGE OF UTERUS    . LYMPH NODE BIOPSY N/A  01/04/2017   Procedure: SENTINEL LYMPH NODE BIOPSY;  Surgeon: Everitt Amber, MD;  Location[facility];  Service: Gynecology;  Laterality: N/A;  . ROBOTIC ASSISTED TOTAL HYSTERECTOMY WITH BILATERAL SALPINGO OOPHERECTOMY Bilateral 01/04/2017   Procedure: XI ROBOTIC ASSISTED TOTAL HYSTERECTOMY WITH BILATERAL SALPINGO OOPHORECTOMY;  Surgeon: Everitt Amber, MD;  Location: [facility];  Service: Gynecology;  Laterality: Bilateral;  . TUBAL LIGATION  YRS AGO    Social History   Social History  . Marital status: Single    Spouse name: N/A  . Number of children: N/A  . Years of education: N/A   Social History Main Topics  . Smoking status: Never Smoker  . Smokeless tobacco: Never Used  . Alcohol use No  . Drug use: No  . Sexual activity: Not on file   Other Topics Concern  . Not on file   Social History Narrative  . No narrative on file     FAMILY HISTORY:  We obtained a detailed, 4-generation family history.  Significant diagnoses are listed below: Family History  Problem Relation Age of Onset  . Heart Problems Father   . Cervical cancer Sister 28    full sister  . High blood pressure Son   . High blood pressure Son   . Breast cancer Sister 9    maternal half sister  . Melanoma Sister   . Bone cancer Mother 66    d.69  . Stomach cancer Maternal Grandfather 60    d.60s  . Colon  cancer Paternal Grandmother 7    d.84  . Kidney cancer Paternal Grandfather 11    d.62   Angela Mays has 2 sons, ages 2 and 12, who have no personal histories of cancer. Angela Mays' full sister, Angela Mays, is currently 51 and has a history of cervical cancer at 47. Angela Mays' maternal half sister, Angela Mays, is 53 and has a history of breast cancer at 39 and 3 melanomas. Angela Mays' mother, Angela Mays, died at 75 and was diagnosed with bone cancer at 31. Angela Mays' father, Angela Mays, died at 22 without cancers.  Angela Mays' mother had a sister who died in her early-90s without cancer and a brother who is alive without  cancers at 12. Angela Mays' maternal grandfather died of stomach cancer in his 21s. Her maternal grandmother died in her 33s from sepsis.  Angela Mays' father was an only child. His father died at 21 from kidney cancer. His mother died at 24 from colon cancer.  Angela Mays is unaware of previous family history of genetic testing for hereditary cancer risks. Patient's maternal and paternal ancestors are of English descent. There is no reported Ashkenazi Jewish ancestry. There is no known consanguinity.  GENETIC COUNSELING ASSESSMENT: Angela Mays is a 66 y.o. female with a personal history of endometrial cancer with loss of MLH1 and PMS2 on immunohistochemical analysis and MSI-high tumor profile which is somewhat suggestive of Lynch syndrome and predisposition to cancer. However, methylation studies revealed that MLH1 hypermethylation was present, making her abnormal IHC and MSI results most likely due to a somatic epigenetic modification of the MLH1 gene promoter region rather than Lynch syndrome. We, therefore, discussed and recommended the following at today's visit.   DISCUSSION: We reviewed the characteristics, features and inheritance patterns of hereditary cancer syndromes. We also discussed genetic testing, including the appropriate family members to test, the process of testing, insurance coverage and turn-around-time for results. We discussed the implications of a negative, positive and/or variant of uncertain significant result. Though it is unlikely that Angela Mays has Lynch syndrome, there could be other hereditary causes of the cancers observed in her family. Therefore, she was offered the option to pursue genetic testing for [facility]'s 46-gene Common Hereditary Cancers Panel.   We discussed that Angela Mays may have an out of pocket cost associated with her genetic testing. We discussed that if her out of pocket cost for testing is over $100, the laboratory will call and confirm whether she  wants to proceed with testing.  If the out of pocket cost of testing is less than $100 she will be billed by the genetic testing laboratory.   PLAN: After considering the risks, benefits, and limitations, Ms. Capron  provided informed consent to pursue genetic testing and the blood sample was sent to [facility] for analysis of the 46-gene Common Hereditary Cancers Panel. Results should be available within approximately 3 weeks' time, at which point they will be disclosed by telephone to Ms. Mandarino, as will any additional recommendations warranted by these results. Ms. Scovill will receive a summary of her genetic counseling visit and a copy of her results once available. This information will also be available in Epic.   Lastly, we encouraged Ms. Nazario to remain in contact with cancer genetics annually so that we can continuously update the family history and inform her of any changes in cancer genetics and testing that may be of benefit for this family.   Ms.  Leven questions were answered to her satisfaction  today. Our contact information was provided should additional questions or concerns arise. Thank you for the referral and allowing [place] to share in the care of your patient.   Mal Misty, MS, [facility] Certified Naval [email or web address] phone: [phone]  The patient was seen for a total of 20 minutes in face-to-face genetic counseling.   _______________________________________________________________________ For Office Staff:  Number of people involved in session: 1 Was an Intern/ student involved with case: no

## 2017-03-29 ENCOUNTER — Encounter: Payer: Self-pay | Admitting: Internal Medicine

## 2017-03-29 ENCOUNTER — Ambulatory Visit (INDEPENDENT_AMBULATORY_CARE_PROVIDER_SITE_OTHER): Payer: Medicare Other | Admitting: Internal Medicine

## 2017-03-29 VITALS — BP 132/82 | HR 83 | Ht 63.5 in | Wt 196.0 lb

## 2017-03-29 DIAGNOSIS — Z7984 Long term (current) use of oral hypoglycemic drugs: Secondary | ICD-10-CM | POA: Diagnosis not present

## 2017-03-29 DIAGNOSIS — Z1159 Encounter for screening for other viral diseases: Secondary | ICD-10-CM | POA: Diagnosis not present

## 2017-03-29 DIAGNOSIS — E1165 Type 2 diabetes mellitus with hyperglycemia: Secondary | ICD-10-CM | POA: Diagnosis not present

## 2017-03-29 DIAGNOSIS — Z13220 Encounter for screening for lipoid disorders: Secondary | ICD-10-CM | POA: Diagnosis not present

## 2017-03-29 DIAGNOSIS — Z0000 Encounter for general adult medical examination without abnormal findings: Secondary | ICD-10-CM | POA: Diagnosis not present

## 2017-03-29 DIAGNOSIS — C541 Malignant neoplasm of endometrium: Secondary | ICD-10-CM | POA: Diagnosis not present

## 2017-03-29 DIAGNOSIS — E119 Type 2 diabetes mellitus without complications: Secondary | ICD-10-CM | POA: Diagnosis not present

## 2017-03-29 LAB — BASIC METABOLIC PANEL WITH GFR
BUN: 19 mg/dL (ref 7–25)
CHLORIDE: 103 mmol/L (ref 98–110)
CO2: 26 mmol/L (ref 20–31)
Calcium: 9.5 mg/dL (ref 8.6–10.4)
Creat: 0.87 mg/dL (ref 0.50–0.99)
GFR, EST AFRICAN AMERICAN: 80 mL/min (ref >=60)
GFR, EST NON AFRICAN AMERICAN: 69 mL/min (ref >=60)
GLUCOSE: 88 mg/dL (ref 65–99)
Potassium: 4.2 mmol/L (ref 3.5–5.3)
SODIUM: 140 mmol/L (ref 135–146)

## 2017-03-29 LAB — POCT GLYCOSYLATED HEMOGLOBIN (HGB A1C): HEMOGLOBIN A1C: 6.2

## 2017-03-29 NOTE — Patient Instructions (Signed)
Please continue: - Metformin XR1000 mg twice a day with meals - Glimepiride 2 mg before breakfast and dinner  If you have a particularly larger meal, you may take 2 tablets before that meal. - Jardiance 25 mg daily before the first meal of the day.  Please return in 3 months with your sugar log.

## 2017-03-29 NOTE — Progress Notes (Signed)
Patient ID: Angela Mays, female   DOB: [birth year], 66 y.o.   MRN: [id number]   HPI: Angela Mays is a 66 y.o.-year-old female, returning for f/u for DM2, dx in 09/2014, non-insulin-dependent, uncontrolled, without long term complications. Last visit 2 mo ago.  She had hysterectomy in 12/2016.  Last hemoglobin A1c was: Lab Results  Component Value Date   HGBA1C 8.2 (H) 11/22/2016   Pt is on a regimen of: - Metformin XR 1000 mg twice a day (after meals). - Glimepiride 2 mg before breakfast and dinner (15-30 min before meals). If you have a particularly larger meal, you may take 2 tablets before that meal.  - Jardiance 25 mg daily before the first meal of the day - started 01/2017 At last visit she told me: "I am not compliant with my meds" - Forgetting many doses or takes them after she eats  Pt checks her sugars 1x a day: - am: 144-184, 201 >> 84 (fasting), 142-200, 263 (chips) >> 118-183 (higher if she snacks at night) - 2h after b'fast: n/c - before lunch: n/c >> 141-206, 254 >> n/c - 2h after lunch: n/c >> 115, 173 - before dinner: n/c - 2h after dinner: 178, 292 >> 293 (out of town) - bedtime: n/c - nighttime: n/c >> 95-205 >> 130  Glucometer: One Touch Ultra mini  Pt's meals are: - Breakfast: oatmeal or PB sandwich or bacon and eggs or cereals or skips during the week - Lunch: sandwich or spaghetti or fast foods - Dinner: largest - meat + veggies + starch - Snacks: 2-3: chocolate, potato chips,  M&M Sweet tea after lunch if eating out, whole milk.  - no CKD, last BUN/creatinine:  Lab Results  Component Value Date   BUN 13 12/30/2016   BUN 15 10/21/2016   CREATININE 0.70 12/30/2016   CREATININE 0.73 10/21/2016   - last set of lipids: No results found for: CHOL, HDL, LDLCALC, LDLDIRECT, TRIG, CHOLHDL - last eye exam was in 2016. No DR.  - no numbness and tingling in her feet.  Of note, patient has a new diagnosis of endometrial cancer >> will have hysterectomy  in 12/2016.  ROS: Constitutional: no weight gain/loss, no fatigue, no subjective hyperthermia/hypothermia Eyes: no blurry vision, no xerophthalmia ENT: no sore throat, no nodules palpated in throat, no dysphagia/odynophagia, no hoarseness Cardiovascular: no CP/SOB/palpitations/leg swelling Respiratory: no cough/SOB Gastrointestinal: no N/V/D/C Musculoskeletal: + muscle/+ joint aches Skin: no rashes Neurological: no tremors/numbness/tingling/dizziness  I reviewed pt's medications, allergies, PMH, social hx, family hx, and changes were documented in the history of present illness. Otherwise, unchanged from my initial visit note.  Past Medical History:  Diagnosis Date  . Cancer [facility])    ENDOMETRIAL CANCER  . Diabetes mellitus without complication (HCC)    TYPE 2  . Leg cramps    and feet OCC  . Pneumonia YRS AGO  . PONV (postoperative nausea and vomiting)    NAUSEA  . Skin rash 10/21/2016   CONTACT DERMITITIS OCC   Past Surgical History:  Procedure Laterality Date  . COLONOSCOPY  AGE 40  . DILATATION & CURETTAGE/HYSTEROSCOPY WITH MYOSURE N/A 11/02/2016   Procedure: DILATATION & CURETTAGE/HYSTEROSCOPY WITH MYOSURE;  Surgeon: Molli Posey, MD;  Location: [facility];  Service: Gynecology;  Laterality: N/A;  . DILATION AND CURETTAGE OF UTERUS    . LYMPH NODE BIOPSY N/A 01/04/2017   Procedure: SENTINEL LYMPH NODE BIOPSY;  Surgeon: Everitt Amber, MD;  Location[facility];  Service: Gynecology;  Laterality:  N/A;  . ROBOTIC ASSISTED TOTAL HYSTERECTOMY WITH BILATERAL SALPINGO OOPHERECTOMY Bilateral 01/04/2017   Procedure: XI ROBOTIC ASSISTED TOTAL HYSTERECTOMY WITH BILATERAL SALPINGO OOPHORECTOMY;  Surgeon: Everitt Amber, MD;  [facility];  Service: Gynecology;  Laterality: Bilateral;  . TUBAL LIGATION  YRS AGO   Social History   Social History  . Marital status: Single    Spouse name: N/A  . Number of children: 2   Occupational History  . Glass blower/designer    Social History Main Topics   . Smoking status: Never Smoker  . Smokeless tobacco: Never Used  . Alcohol use No  . Drug use: No   Current Outpatient Prescriptions on File Prior to Visit  Medication Sig Dispense Refill  . empagliflozin (JARDIANCE) 25 MG TABS tablet Take 25 mg by mouth daily. 90 tablet 1  . glimepiride (AMARYL) 2 MG tablet Take 1 tablet (2 mg total) by mouth 2 (two) times daily before a meal. 180 tablet 3  . metFORMIN (GLUCOPHAGE-XR) 500 MG 24 hr tablet Take 1 tablet (500 mg total) by mouth daily with breakfast. (Patient taking differently: Take 1,000 mg by mouth 2 (two) times daily. ) 360 tablet 3   No current facility-administered medications on file prior to visit.    Allergies  Allergen Reactions  . Flagyl [Metronidazole] Rash   Family History  Problem Relation Age of Onset  . Heart Problems Father   . Cervical cancer Sister 67    full sister  . High blood pressure Son   . High blood pressure Son   . Breast cancer Sister 74    maternal half sister  . Melanoma Sister   . Bone cancer Mother 70    d.69  . Stomach cancer Maternal Grandfather 60    d.60s  . Colon cancer Paternal Grandmother 26    d.84  . Kidney cancer Paternal Grandfather 76    d.58   PE: BP 132/82 (BP Location: Left Arm, Patient Position: Sitting)   Pulse 83   Ht 5' 3.5" (1.613 m)   Wt 196 lb (88.9 kg)   SpO2 97%   BMI 34.18 kg/m  Wt Readings from Last 3 Encounters:  03/29/17 196 lb (88.9 kg)  02/02/17 196 lb (88.9 kg)  01/28/17 195 lb 9.6 oz (88.7 kg)   Constitutional: overweight, in NAD Eyes: PERRLA, EOMI, no exophthalmos ENT: moist mucous membranes, no thyromegaly, no cervical lymphadenopathy Cardiovascular: RRR, No MRG Respiratory: CTA B Gastrointestinal: abdomen soft, NT, ND, BS+ Musculoskeletal: no deformities, strength intact in all 4 Skin: moist, warm, no rashes Neurological: no tremor with outstretched hands, DTR normal in all 4  ASSESSMENT: 1. DM2, non-insulin-dependent, uncontrolled, without  long term complications, but with hyperglycemia  PLAN:  1. Patient with 2 years of uncontrolled diabetes, on oral antidiabetic regimen, which we adjusted at last visit >> added Jardiance >> sugars are better, execpt when she has dietary indiscretions. Will not change regimen, but I advised her to increase the sulfonylurea if she has a larger dinner. - Will check a GFR and potassium today. - HbA1c today is much better: 6.2% - I suggested to:  Patient Instructions  Please continue: - Metformin XR1000 mg twice a day with meals - Glimepiride 2 mg before breakfast and dinner  If you have a particularly larger meal, you may take 2 tablets before that meal. - Jardiance 25 mg daily before the first meal of the day.  Please stop at the lab.  Please return in 3 months with your  sugar log.   - continue checking sugars at different times of the day - check 1 time a day, rotating checks - again advised for yearly eye exams >> she is due for another one  - Return to clinic in 3 mo with sugar log   Office Visit on 03/29/2017  Component Date Value Ref Range Status  . Sodium 03/29/2017 140  135 - 146 mmol/L Final  . Potassium 03/29/2017 4.2  3.5 - 5.3 mmol/L Final  . Chloride 03/29/2017 103  98 - 110 mmol/L Final  . CO2 03/29/2017 26  20 - 31 mmol/L Final  . Glucose, Bld 03/29/2017 88  65 - 99 mg/dL Final  . BUN 03/29/2017 19  7 - 25 mg/dL Final  . Creat 03/29/2017 0.87  0.50 - 0.99 mg/dL Final   Comment:   For patients > or = 67 years of age: The upper reference limit for Creatinine is approximately 13% higher for people identified as African-American.     . Calcium 03/29/2017 9.5  8.6 - 10.4 mg/dL Final  . GFR, Est African American 03/29/2017 80  >=60 mL/min Final  . GFR, Est Non African American 03/29/2017 69  >=60 mL/min Final  . Hemoglobin A1C 03/29/2017 6.2   Final   Labs are normal >> we can continue Jardiance.  Philemon Kingdom, MD PhD [facility] Endocrinology

## 2017-04-07 ENCOUNTER — Ambulatory Visit: Payer: Self-pay | Admitting: Genetics

## 2017-04-07 ENCOUNTER — Telehealth: Payer: Self-pay | Admitting: Genetics

## 2017-04-07 ENCOUNTER — Encounter: Payer: Self-pay | Admitting: Genetics

## 2017-04-07 DIAGNOSIS — Z1379 Encounter for other screening for genetic and chromosomal anomalies: Secondary | ICD-10-CM

## 2017-04-07 HISTORY — DX: Encounter for other screening for genetic and chromosomal anomalies: Z13.79

## 2017-04-07 NOTE — Telephone Encounter (Signed)
Reviewed that germline genetic testing revealed no pathogenic mutations. This is considered to be a negative result. Testing was performed through [facility]'s 46-gene Common Hereditary Cancers Panel. [facility]'s Common Hereditary Cancers Panel includes analysis of the following 46 genes: APC, ATM, AXIN2, BARD1, BMPR1A, BRCA1, BRCA2, BRIP1, CDH1, CDKN2A, CHEK2, CTNNA1, DICER1, EPCAM, GREM1, HOXB13, KIT, MEN1, MLH1, MSH2, MSH3, MSH6, MUTYH, NBN, NF1, NTHL1, PALB2, PDGFRA, PMS2, POLD1, POLE, PTEN, RAD50, RAD51C, RAD51D, SDHA, SDHB, SDHC, SDHD, SMAD4, SMARCA4, STK11, TP53, TSC1, TSC2, and VHL.  A variant of uncertain significance (VUS) was noted in [place]. The specific NTHL1 variant is c.194G>A (p.Arg65His). Discussed that this VUS should not change clinical management.  For more detailed discussion, please see genetic counseling documentation from 04/07/2017. Result report dated 04/06/2017.

## 2017-04-07 NOTE — Progress Notes (Signed)
HPI: Ms. Willette was previously seen in the [place] clinic due to a personal and family history of cancer and concerns regarding a hereditary predisposition to cancer. Please refer to our prior cancer genetics clinic note for more information regarding Ms. Nuno's medical, social and family histories, and our assessment and recommendations, at the time. Ms. Busler recent genetic test results were disclosed to her, as were recommendations warranted by these results. These results and recommendations are discussed in more detail below.  CANCER HISTORY:   No history exists.     FAMILY HISTORY:  We obtained a detailed, 4-generation family history.  Significant diagnoses are listed below: Family History  Problem Relation Age of Onset  . Heart Problems Father   . Cervical cancer Sister 72    full sister  . High blood pressure Son   . High blood pressure Son   . Breast cancer Sister 37    maternal half sister  . Melanoma Sister   . Bone cancer Mother 33    d.69  . Stomach cancer Maternal Grandfather 60    d.60s  . Colon cancer Paternal Grandmother 88    d.84  . Kidney cancer Paternal Grandfather 69    d.58    GENETIC TEST RESULTS: Genetic testing performed through [facility]'s Common Hereditary Cancers Panel reported out on 04/06/2017 showed no deleterious mutations. [facility]'s Common Hereditary Cancers Panel includes analysis of the following 46 genes: APC, ATM, AXIN2, BARD1, BMPR1A, BRCA1, BRCA2, BRIP1, CDH1, CDKN2A, CHEK2, CTNNA1, DICER1, EPCAM, GREM1, HOXB13, KIT, MEN1, MLH1, MSH2, MSH3, MSH6, MUTYH, NBN, NF1, NTHL1, PALB2, PDGFRA, PMS2, POLD1, POLE, PTEN, RAD50, RAD51C, RAD51D, SDHA, SDHB, SDHC, SDHD, SMAD4, SMARCA4, STK11, TP53, TSC1, TSC2, and VHL.  A variant of uncertain significance (VUS) called NTHL1 c.194G>A (p.Arg65His) was also noted. At this time, it is unknown if this variant is associated with increased cancer risk or if this is a normal finding, but most  variants such as this get reclassified to being inconsequential. It should not be used to make medical management decisions. With time, we suspect the lab will determine the significance of this variant, if any. If we do learn more about it, we will try to contact Ms. Bralley to discuss it further. However, it is important to stay in touch with [place] periodically and keep the address and phone number up to date.  The test report will be scanned into EPIC and will be located under the Molecular Pathology section of the Results Review tab.A portion of the result report is included below for reference.      We discussed with Ms. Stoy that since the current genetic testing is not perfect, it is possible there may be a gene mutation in one of these genes that current testing cannot detect, but that chance is small. We also discussed, that it is possible that another gene that has not yet been discovered, or that we have not yet tested, is responsible for the cancer diagnoses in the family. Therefore, important to remain in touch with cancer genetics in the future so that we can continue to offer Ms. Prins the most up to date genetic testing.   CANCER SCREENING RECOMMENDATIONS: This normal result is reassuring and indicates that it is unlikely Ms. Percival has an increased risk of cancer due to a mutation in one of these genes. However, the cancers in her family remain unexplained. She may be at increased risk for the cancers observed in her family. Due to her maternal  half-sisters' history of melanoma, Ms. Char should consult a dermatologist regarding appropriate skin cancer screenings and prevention. There are no other alterations to her cancer screening program that we recommend at this time. Ms. Erlich was advised to continue following the cancer screening guidelines provided by her primary healthcare providers. At her initial appointment, we discussed that she is overdue for a colonoscopy and mammogram. She  states that she is currently in the process of scheduling these.  RECOMMENDATIONS FOR FAMILY MEMBERS: Women in this family should consult their health care providers regarding a personalized cancer screening plan based on the observed family history of cancers. If Ms. Cupples' maternal half-sister would like to consider genetic testing for hereditary predisposition to melanoma, she should consult her physicians. It does not appear that there is any additional genetic testing recommended for other family members at this time. However, if Ms. Gillham' family members are interested in discussing their hereditary cancer risks and genetic testing options, they are advised to consult their physicians.  FOLLOW-UP: Lastly, we discussed with Ms. Northway that cancer genetics is a rapidly advancing field and it is possible that new genetic tests will be appropriate for her and/or her family members in the future. We encouraged her to remain in contact with cancer genetics on an annual basis so we can update her personal and family histories and let her know of advances in cancer genetics that may benefit this family.   Our contact number was provided. Ms. Salonga questions were answered to her satisfaction, and she knows she is welcome to call us at anytime with additional questions or concerns.   Mal Misty, MS, [facility] Certified [email or web address]

## 2017-04-27 ENCOUNTER — Encounter: Payer: Self-pay | Admitting: Gastroenterology

## 2017-05-23 NOTE — Addendum Note (Signed)
Addendum  created 05/23/17 1005 by Heavenlee Maiorana, MD   Sign clinical note    

## 2017-06-27 ENCOUNTER — Encounter: Payer: Medicare Other | Admitting: Gastroenterology

## 2017-07-04 ENCOUNTER — Encounter: Payer: Medicare Other | Admitting: Gastroenterology

## 2017-07-05 ENCOUNTER — Encounter: Payer: Self-pay | Admitting: Internal Medicine

## 2017-07-05 ENCOUNTER — Ambulatory Visit (INDEPENDENT_AMBULATORY_CARE_PROVIDER_SITE_OTHER): Payer: Medicare Other | Admitting: Internal Medicine

## 2017-07-05 VITALS — BP 124/82 | HR 70 | Ht 63.0 in | Wt 202.0 lb

## 2017-07-05 DIAGNOSIS — E1165 Type 2 diabetes mellitus with hyperglycemia: Secondary | ICD-10-CM | POA: Diagnosis not present

## 2017-07-05 LAB — POCT GLYCOSYLATED HEMOGLOBIN (HGB A1C): HEMOGLOBIN A1C: 6.5

## 2017-07-05 MED ORDER — METFORMIN HCL ER 500 MG PO TB24: 1000.0000 mg | ORAL_TABLET | Freq: Two times a day (BID) | ORAL | 3 refills | Status: DC

## 2017-07-05 NOTE — Addendum Note (Signed)
Addended by: Caprice Beaver T on: 07/05/2017 02:13 PM   Modules accepted: Orders

## 2017-07-05 NOTE — Progress Notes (Signed)
Patient ID: Angela Mays, female   DOB: [birth year], 66 y.o.   MRN: [id number]   HPI: Angela Mays is a 66 y.o.-year-old female, returning for f/u for DM2, dx in 09/2014, non-insulin-dependent, uncontrolled, without long term complications. Last visit 3 mo ago.  She has an episode of tremors before dinner yesterday >> did not check sugars, but felt low. This happened once again 2 weeks ago.   Last hemoglobin A1c was: Lab Results  Component Value Date   HGBA1C 6.2 03/29/2017   HGBA1C 8.2 (H) 11/22/2016   Pt is on a regimen of: - Metformin XR 1000 mg twice a day  - Glimepiride 2 mg before breakfast and dinner (15-30 min before meals). If you have a particularly larger meal, you may take 2 tablets before that meal.  - Jardiance 25 mg daily before the first meal of the day - started 01/2017 At last visit she told me: "I am not compliant with my meds" - Forgetting many doses or takes them after she eats  Pt is not checkign sugars since last visit... - am: 84 (fasting), 142-200, 263 (chips) >> 118-183 (higher if she snacks at night) >> n/c - 2h after b'fast: n/c - before lunch: n/c >> 141-206, 254 >> n/c - 2h after lunch: n/c >> 115, 173 - before dinner: n/c - 2h after dinner: 178, 292 >> 293 (out of town) >> n/c - bedtime: n/c - nighttime: n/c >> 95-205 >> 130 >> n/c  Glucometer: One Touch ultra mini  Pt's meals are: - Breakfast: oatmeal or PB sandwich or bacon and eggs or cereals or skips during the week - Lunch: sandwich or spaghetti or fast foods - Dinner: largest - meat + veggies + starch - Snacks: 2-3: chocolate, potato chips,  M&M Sweet tea after lunch if eating out, whole milk.  - No CKD, last BUN/creatinine:  Lab Results  Component Value Date   BUN 19 03/29/2017   BUN 13 12/30/2016   CREATININE 0.87 03/29/2017   CREATININE 0.70 12/30/2016   - last set of lipids: Per PCP >> had a level checked in 03/2017 No results found for: CHOL, HDL, LDLCALC, LDLDIRECT, TRIG,  CHOLHDL - last eye exam was in 2016 >> no DR - She denies numbness and tingling in her feet.  Of note, patient has a new diagnosis of endometrial cancer >> Had hysterectomy. She denies  ROS: Constitutional: + weight gain, no fatigue, no subjective hyperthermia, no subjective hypothermia Eyes: no blurry vision, no xerophthalmia ENT: no sore throat, no nodules palpated in throat, no dysphagia, no odynophagia, no hoarseness Cardiovascular: no CP/no SOB/no palpitations/no leg swelling Respiratory: no cough/no SOB/no wheezing Gastrointestinal: no N/no V/no D/no C/no acid reflux Musculoskeletal: no muscle aches/no joint aches Skin: no rashes, no hair loss Neurological: no tremors/no numbness/no tingling/no dizziness  I reviewed pt's medications, allergies, PMH, social hx, family hx, and changes were documented in the history of present illness. Otherwise, unchanged from my initial visit note.  Past Medical History:  Diagnosis Date  . Cancer [facility])    ENDOMETRIAL CANCER  . Diabetes mellitus without complication ([place])    TYPE 2  . Genetic testing 04/07/2017   Angela Mays underwent genetic counseling and testing for hereditary cancer syndromes on 03/24/2017. There were no pathogenic mutations identified in any of the 46 genes analyzed by [facility]'s 46-gene Common Hereditary Cancers Panel. Genes analyzed include: APC, ATM, AXIN2, BARD1, BMPR1A, BRCA1, BRCA2, BRIP1, CDH1, CDKN2A, CHEK2, CTNNA1, DICER1, EPCAM, GREM1, HOXB13, KIT, MEN1,  MLH1, MSH2, MSH3, MSH6  . Leg cramps    and feet OCC  . Pneumonia YRS AGO  . PONV (postoperative nausea and vomiting)    NAUSEA  . Skin rash 10/21/2016   CONTACT DERMITITIS OCC   Past Surgical History:  Procedure Laterality Date  . COLONOSCOPY  AGE 5  . DILATATION & CURETTAGE/HYSTEROSCOPY WITH MYOSURE N/A 11/02/2016   Procedure: DILATATION & CURETTAGE/HYSTEROSCOPY WITH MYOSURE;  Surgeon: Molli Posey, MD;  Location: [facility];  Service: Gynecology;  Laterality:  N/A;  . DILATION AND CURETTAGE OF UTERUS    . LYMPH NODE BIOPSY N/A 01/04/2017   Procedure: SENTINEL LYMPH NODE BIOPSY;  Surgeon: Everitt Amber, MD;  Location: [facility];  Service: Gynecology;  Laterality: N/A;  . ROBOTIC ASSISTED TOTAL HYSTERECTOMY WITH BILATERAL SALPINGO OOPHERECTOMY Bilateral 01/04/2017   Procedure: XI ROBOTIC ASSISTED TOTAL HYSTERECTOMY WITH BILATERAL SALPINGO OOPHORECTOMY;  Surgeon: Everitt Amber, MD;  Location: [facility];  Service: Gynecology;  Laterality: Bilateral;  . TUBAL LIGATION  YRS AGO   Social History   Social History  . Marital status: Single    Spouse name: N/A  . Number of children: 2   Occupational History  . Glass blower/designer    Social History Main Topics  . Smoking status: Never Smoker  . Smokeless tobacco: Never Used  . Alcohol use No  . Drug use: No   Current Outpatient Prescriptions on File Prior to Visit  Medication Sig Dispense Refill  . empagliflozin (JARDIANCE) 25 MG TABS tablet Take 25 mg by mouth daily. 90 tablet 1  . glimepiride (AMARYL) 2 MG tablet Take 1 tablet (2 mg total) by mouth 2 (two) times daily before a meal. 180 tablet 3  . metFORMIN (GLUCOPHAGE-XR) 500 MG 24 hr tablet Take 1 tablet (500 mg total) by mouth daily with breakfast. (Patient taking differently: Take 1,000 mg by mouth 2 (two) times daily. ) 360 tablet 3   No current facility-administered medications on file prior to visit.    Allergies  Allergen Reactions  . Flagyl [Metronidazole] Rash   Family History  Problem Relation Age of Onset  . Heart Problems Father   . Cervical cancer Sister 27       full sister  . High blood pressure Son   . High blood pressure Son   . Breast cancer Sister 71       maternal half sister  . Melanoma Sister   . Bone cancer Mother 9       d.69  . Stomach cancer Maternal Grandfather 60       d.60s  . Colon cancer Paternal Grandmother 28       d.84  . Kidney cancer Paternal Grandfather 40       d.58   PE: BP 124/82 (BP Location: Left  Arm, Patient Position: Sitting)   Pulse 70   Ht '5\' 3"'  (1.6 m)   Wt 202 lb (91.6 kg)   SpO2 97%   BMI 35.78 kg/m  Wt Readings from Last 3 Encounters:  07/05/17 202 lb (91.6 kg)  03/29/17 196 lb (88.9 kg)  02/02/17 196 lb (88.9 kg)   Constitutional: overweight, in NAD Eyes: PERRLA, EOMI, no exophthalmos ENT: moist mucous membranes, no thyromegaly, no cervical lymphadenopathy Cardiovascular: RRR, No MRG Respiratory: CTA B Gastrointestinal: abdomen soft, NT, ND, BS+ Musculoskeletal: no deformities, strength intact in all 4 Skin: moist, warm, no rashes Neurological: no tremor with outstretched hands, DTR normal in all 4  ASSESSMENT: 1. DM2, non-insulin-dependent, uncontrolled, without long term  complications, but with hyperglycemia  PLAN:  1. Patient with history of uncontrolled diabetes, with significant improvement in her blood sugars after adding Jardiance, with an HbA1c of 6.2% at last visit. However, since last visit, she continued dietary indiscretions and she did not check her sugars. She believes she had 2 low blood sugars episodes before dinner. She did not take the second dose of metformin in those days, but we discussed that the most likely culprit was glimepiride. I advised her to let me know if this happens again so we can decrease the dose of glimepiride. In the meantime, she needs to start checking her sugars consistently, once a day, rotating check times.  - I suggested to:  Patient Instructions  Please continue: - Metformin XR1000 mg twice a day with meals - Glimepiride 2 mg before breakfast and dinner  If you have a particularly larger meal, you may take 2 tablets before that meal. - Jardiance 25 mg daily before first meal of the day  Please return in 3 months with your sugar log.   - today, HbA1c is 6.5% (slightly higher) - continue checking sugars at different times of the day - check 1x a day, rotating checks - advised for yearly eye exams >> she is UTD -  Return to clinic in 3 mo with sugar log   Philemon Kingdom, MD PhD [facility] Endocrinology

## 2017-07-05 NOTE — Patient Instructions (Addendum)
Please continue: - Metformin XR1000 mg 2x a day with meals - Glimepiride 2 mg before breakfast and dinner  If you have a particularly larger meal, you may take 2 tablets before that meal. - Jardiance 25 mg daily before first meal of the day  Please restart checking sugars 1x a day.  Please return in 3 months with your sugar log.

## 2017-07-13 ENCOUNTER — Encounter: Payer: Self-pay | Admitting: Family Medicine

## 2017-07-21 ENCOUNTER — Other Ambulatory Visit: Payer: Self-pay | Admitting: Internal Medicine

## 2017-08-01 ENCOUNTER — Encounter: Payer: Medicare Other | Admitting: Gastroenterology

## 2017-10-04 ENCOUNTER — Encounter: Payer: Self-pay | Admitting: Internal Medicine

## 2017-10-04 ENCOUNTER — Ambulatory Visit (INDEPENDENT_AMBULATORY_CARE_PROVIDER_SITE_OTHER): Payer: Medicare Other | Admitting: Internal Medicine

## 2017-10-04 VITALS — BP 124/80 | HR 83 | Wt 203.0 lb

## 2017-10-04 DIAGNOSIS — Z23 Encounter for immunization: Secondary | ICD-10-CM

## 2017-10-04 DIAGNOSIS — Z6835 Body mass index (BMI) 35.0-35.9, adult: Secondary | ICD-10-CM

## 2017-10-04 DIAGNOSIS — E1165 Type 2 diabetes mellitus with hyperglycemia: Secondary | ICD-10-CM

## 2017-10-04 LAB — POCT GLYCOSYLATED HEMOGLOBIN (HGB A1C): HEMOGLOBIN A1C: 6.1

## 2017-10-04 MED ORDER — GLIMEPIRIDE 2 MG PO TABS: 2.0000 mg | ORAL_TABLET | Freq: Every day | ORAL | 3 refills | Status: DC

## 2017-10-04 MED ORDER — EMPAGLIFLOZIN 25 MG PO TABS
25.0000 mg | ORAL_TABLET | Freq: Every day | ORAL | 3 refills | Status: DC
Start: 2017-10-04 — End: 2018-12-22

## 2017-10-04 NOTE — Progress Notes (Signed)
Patient ID: Angela Mays, female   DOB: 08-May-1951, 66 y.o.   MRN: [id number]   HPI: Angela Mays is a 66 y.o.-year-old female, returning for f/u for DM2, dx in 09/2014, non-insulin-dependent, uncontrolled, without long term complications. Last visit 3 mo ago.  Last hemoglobin A1c was: Lab Results  Component Value Date   HGBA1C 6.5 07/05/2017   HGBA1C 6.2 03/29/2017   HGBA1C 8.2 (H) 11/22/2016   Pt is on: - Metformin XR 1000 mg 2x a day with meals - Glimepiride 2 mg before breakfast and dinner  If you have a particularly larger meal, you may take 2 tablets before that meal. - Jardiance 25 mg daily before first meal of the day -  started 01/2017  Pt is checking sugars 0-1x a day: - am: 84 (fasting), 142-200, 263 (chips) >> 118-183 (higher if she snacks at night) >> n/c >> 88-121, 226 (forgot med) - 2h after b'fast: n/c - before lunch: n/c >> 141-206, 254 >> n/c >> 87 - 2h after lunch: n/c >> 115, 173 >> n/c - before dinner: n/c >> 59-92 - 2h after dinner: 178, 292 >> 293 (out of town) >> n/c - bedtime: n/c - nighttime: n/c >> 95-205 >> 130 >> n/c  Glucometer: One Touch ultra mini  Pt's meals are: - Breakfast: oatmeal or PB sandwich or bacon and eggs or cereals or skips during the week - Lunch: sandwich or spaghetti or fast foods - Dinner: largest - meat + veggies + starch - Snacks: 2-3: less chocolate, potato chips,  M&M  - No CKD, last BUN/creatinine:  Lab Results  Component Value Date   BUN 19 03/29/2017   BUN 13 12/30/2016   CREATININE 0.87 03/29/2017   CREATININE 0.70 12/30/2016  03/29/2017: ACR undet. - last set of lipids: 03/29/2017: 193/56/63/119 No results found for: CHOL, HDL, LDLCALC, LDLDIRECT, TRIG, CHOLHDL - last eye exam was in 2016 >> No DR. Has one coming up in 11/2017. - No numbness and tingling in her feet.  Of note, patient has a new diagnosis of endometrial cancer >> Had hysterectomy.   ROS: Constitutional: no weight gain/no weight loss, no  fatigue, no subjective hyperthermia, no subjective hypothermia Eyes: no blurry vision, no xerophthalmia ENT: no sore throat, no nodules palpated in throat, no dysphagia, no odynophagia, no hoarseness Cardiovascular: no CP/no SOB/no palpitations/no leg swelling Respiratory: no cough/no SOB/no wheezing Gastrointestinal: no N/no V/no D/no C/no acid reflux Musculoskeletal: no muscle aches/no joint aches Skin: no rashes, no hair loss Neurological: no tremors/no numbness/no tingling/no dizziness  I reviewed pt's medications, allergies, PMH, social hx, family hx, and changes were documented in the history of present illness. Otherwise, unchanged from my initial visit note.  Past Medical History:  Diagnosis Date  . Cancer [facility])    ENDOMETRIAL CANCER  . Diabetes mellitus without complication ([place])    TYPE 2  . Genetic testing 04/07/2017   Angela Mays underwent genetic counseling and testing for hereditary cancer syndromes on 03/24/2017. There were no pathogenic mutations identified in any of the 46 genes analyzed by [facility]'s 46-gene Common Hereditary Cancers Panel. Genes analyzed include: APC, ATM, AXIN2, BARD1, BMPR1A, BRCA1, BRCA2, BRIP1, CDH1, CDKN2A, CHEK2, CTNNA1, DICER1, EPCAM, GREM1, HOXB13, KIT, MEN1, MLH1, MSH2, MSH3, MSH6  . Leg cramps    and feet OCC  . Pneumonia YRS AGO  . PONV (postoperative nausea and vomiting)    NAUSEA  . Skin rash 10/21/2016   CONTACT DERMITITIS OCC   Past Surgical History:  Procedure  Laterality Date  . COLONOSCOPY  AGE 5  . DILATATION & CURETTAGE/HYSTEROSCOPY WITH MYOSURE N/A 11/02/2016   Procedure: DILATATION & CURETTAGE/HYSTEROSCOPY WITH MYOSURE;  Surgeon: Molli Posey, MD;  Location: [facility];  Service: Gynecology;  Laterality: N/A;  . DILATION AND CURETTAGE OF UTERUS    . LYMPH NODE BIOPSY N/A 01/04/2017   Procedure: SENTINEL LYMPH NODE BIOPSY;  Surgeon: Everitt Amber, MD;  Location: [facility];  Service: Gynecology;  Laterality: N/A;  . ROBOTIC ASSISTED  TOTAL HYSTERECTOMY WITH BILATERAL SALPINGO OOPHERECTOMY Bilateral 01/04/2017   Procedure: XI ROBOTIC ASSISTED TOTAL HYSTERECTOMY WITH BILATERAL SALPINGO OOPHORECTOMY;  Surgeon: Everitt Amber, MD;  Location: [facility];  Service: Gynecology;  Laterality: Bilateral;  . TUBAL LIGATION  YRS AGO   Social History   Social History  . Marital status: Single    Spouse name: N/A  . Number of children: 2   Occupational History  . Glass blower/designer    Social History Main Topics  . Smoking status: Never Smoker  . Smokeless tobacco: Never Used  . Alcohol use No  . Drug use: No   Current Outpatient Prescriptions on File Prior to Visit  Medication Sig Dispense Refill  . glimepiride (AMARYL) 2 MG tablet Take 1 tablet (2 mg total) by mouth 2 (two) times daily before a meal. 180 tablet 3  . JARDIANCE 25 MG TABS tablet TAKE 1 TABLET DAILY 90 tablet 1  . metFORMIN (GLUCOPHAGE-XR) 500 MG 24 hr tablet Take 2 tablets (1,000 mg total) by mouth 2 (two) times daily. 360 tablet 3   No current facility-administered medications on file prior to visit.    Allergies  Allergen Reactions  . Flagyl [Metronidazole] Rash   Family History  Problem Relation Age of Onset  . Heart Problems Father   . Cervical cancer Sister 37       full sister  . High blood pressure Son   . High blood pressure Son   . Breast cancer Sister 65       maternal half sister  . Melanoma Sister   . Bone cancer Mother 17       d.69  . Stomach cancer Maternal Grandfather 60       d.60s  . Colon cancer Paternal Grandmother 32       d.84  . Kidney cancer Paternal Grandfather 48       d.58   PE: BP 124/80 (BP Location: Left Arm, Patient Position: Sitting)   Pulse 83   Wt 203 lb (92.1 kg)   SpO2 96%   BMI 35.96 kg/m  Wt Readings from Last 3 Encounters:  10/04/17 203 lb (92.1 kg)  07/05/17 202 lb (91.6 kg)  03/29/17 196 lb (88.9 kg)   Constitutional: overweight, in NAD Eyes: PERRLA, EOMI, no exophthalmos ENT: moist mucous membranes,  no thyromegaly, no cervical lymphadenopathy Cardiovascular: RRR, No MRG Respiratory: CTA B Gastrointestinal: abdomen soft, NT, ND, BS+ Musculoskeletal: no deformities, strength intact in all 4 Skin: moist, warm, no rashes Neurological: no tremor with outstretched hands, DTR normal in all 4  ASSESSMENT: 1. DM2, non-insulin-dependent, uncontrolled, without long term complications, but with hyperglycemia  2. Obesity class 2 BMI Classification:  < 18.5 underweight   18.5-24.9 normal weight   25.0-29.9 overweight   30.0-34.9 class I obesity   35.0-39.9 class II obesity   ? 40.0 class III obesity   PLAN:  1. Patient with history of uncontrolled diabetes, with significant improvement of her diabetes control after adding Jardiance, now continuing to improve after  she started to change her diet to reduce the amount of snacks and to also include breakfast. Her sugars in the morning are improved and they are actually quite low in the rest of the day, including one sugar in the 50s before dinner. Therefore, we will reduce the glimepiride to only one dose, before dinner. - At next visit, I hope we can stop glimepiride completely  - I suggested to:  Patient Instructions  Please continue: - Metformin XR 1000 mg 2x a day with meals - Jardiance 25 mg daily before first meal of the day  Please decrease: - Glimepiride to 2 mg before dinner only If you have a particularly larger meal, you may take 2 tablets before that meal.  Please return in 3-4 months with your sugar log.   - today, HbA1c is 6.1% (excellent) - continue checking sugars at different times of the day - check 1x a day, rotating checks - advised for yearly eye exams >> she  Needs one >> scheduled - + flu shot today - Return to clinic in 3-4 mo with sugar log    2. Obesity class 2 - She did not lose weight since last visit, however, she started to improve her diet by eating breakfast and not eating as many snacks. We will  also reduce the dose of glimepiride so that her sugars are not that low during the day so she is not too hungry. This will also help with her weight loss.  Philemon Kingdom, MD PhD [facility] Endocrinology

## 2017-10-04 NOTE — Addendum Note (Signed)
Addended by: Caprice Beaver T on: 10/04/2017 09:52 AM   Modules accepted: Orders

## 2017-10-04 NOTE — Patient Instructions (Addendum)
Please continue: - Metformin XR 1000 mg 2x a day with meals - Jardiance 25 mg daily before first meal of the day  Please decrease: - Glimepiride to 2 mg before dinner only If you have a particularly larger meal, you may take 2 tablets before that meal.  Please return in 3-4 months with your sugar log.

## 2017-10-05 ENCOUNTER — Ambulatory Visit: Payer: Medicare Other | Admitting: Internal Medicine

## 2017-11-14 IMAGING — CR DG CHEST 2V
2 series · 2 of 2 positions shown · non-contrast
Comparison: None.

CLINICAL DATA: Endometrial cancer.

EXAM:
CHEST  2 VIEW

[w chest pa]
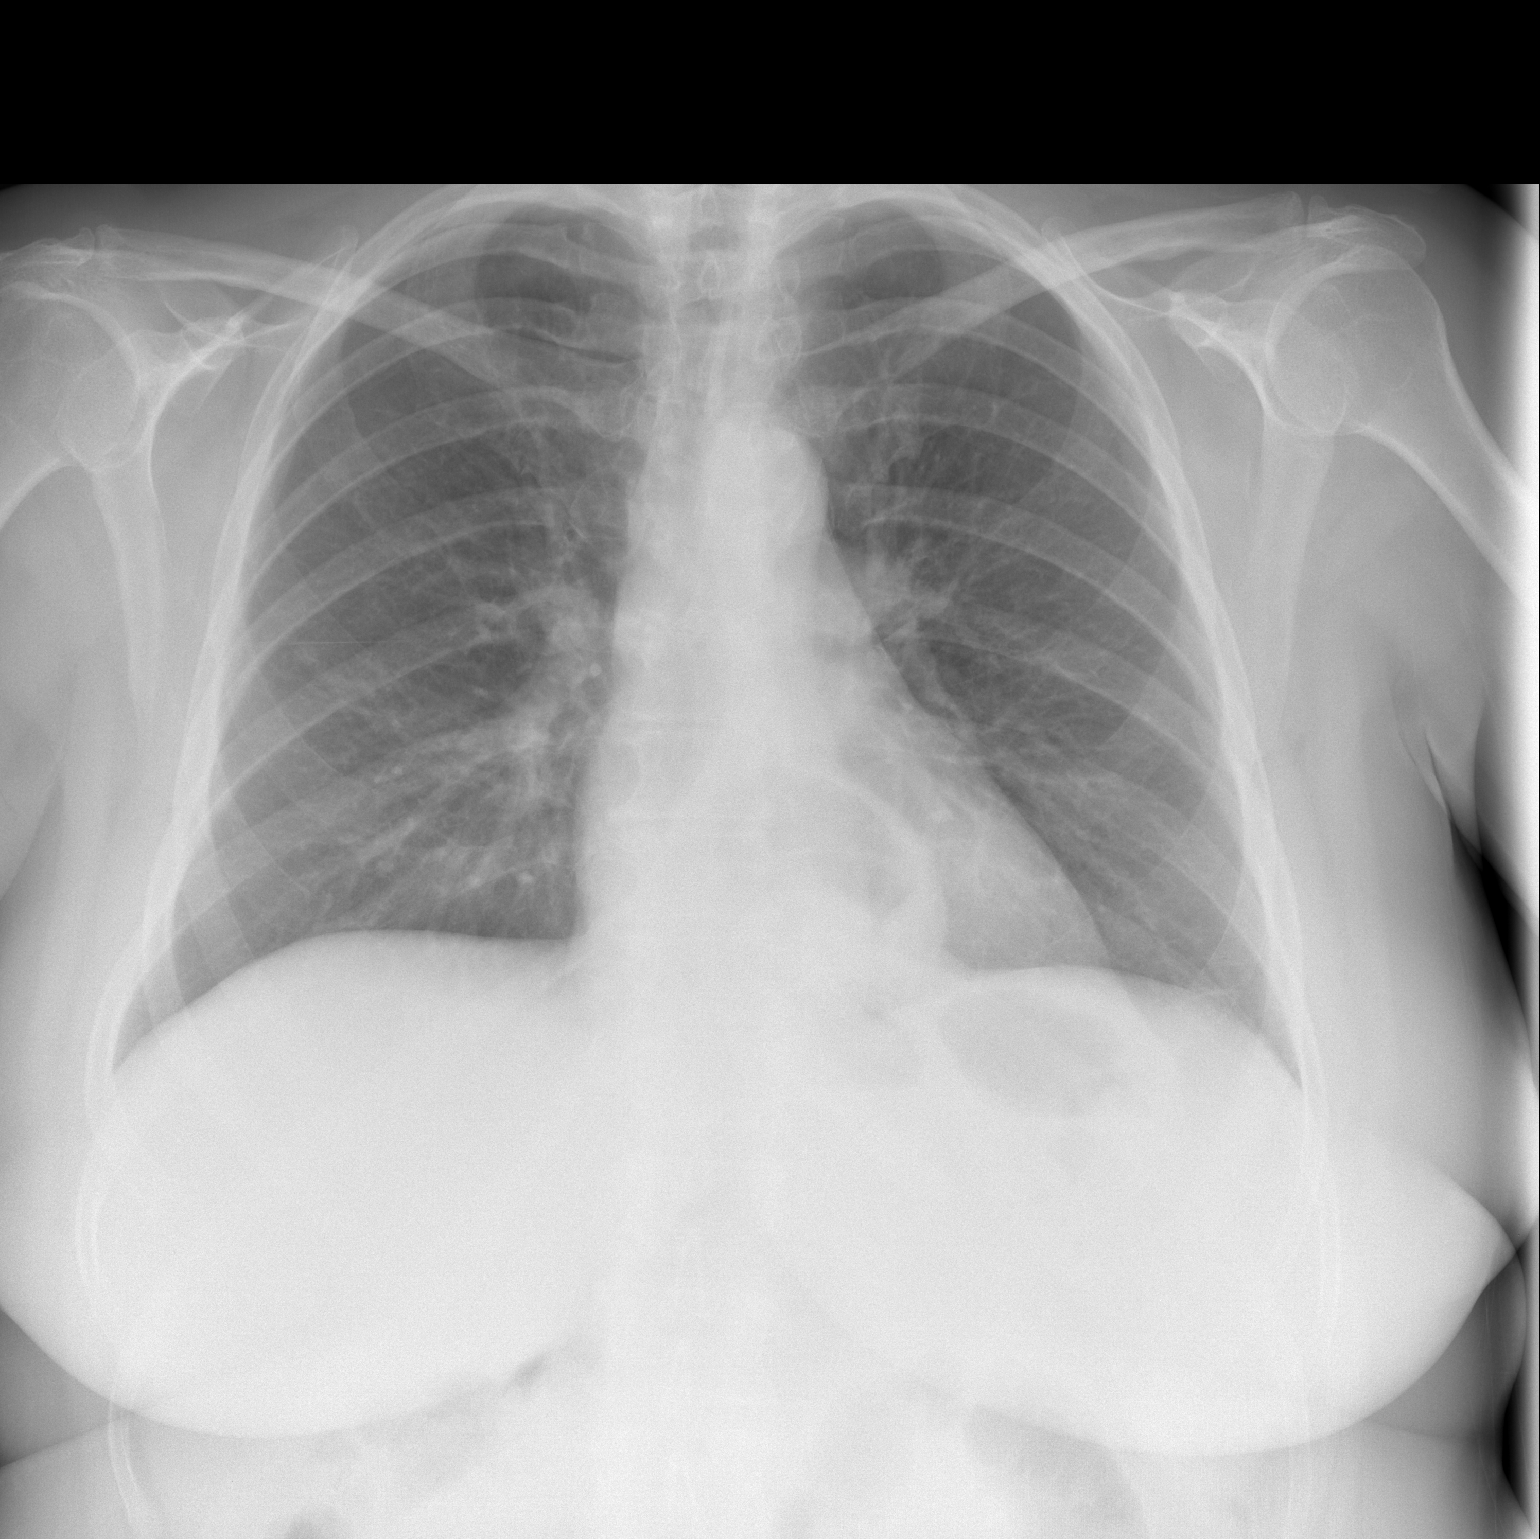

[w chest lat]
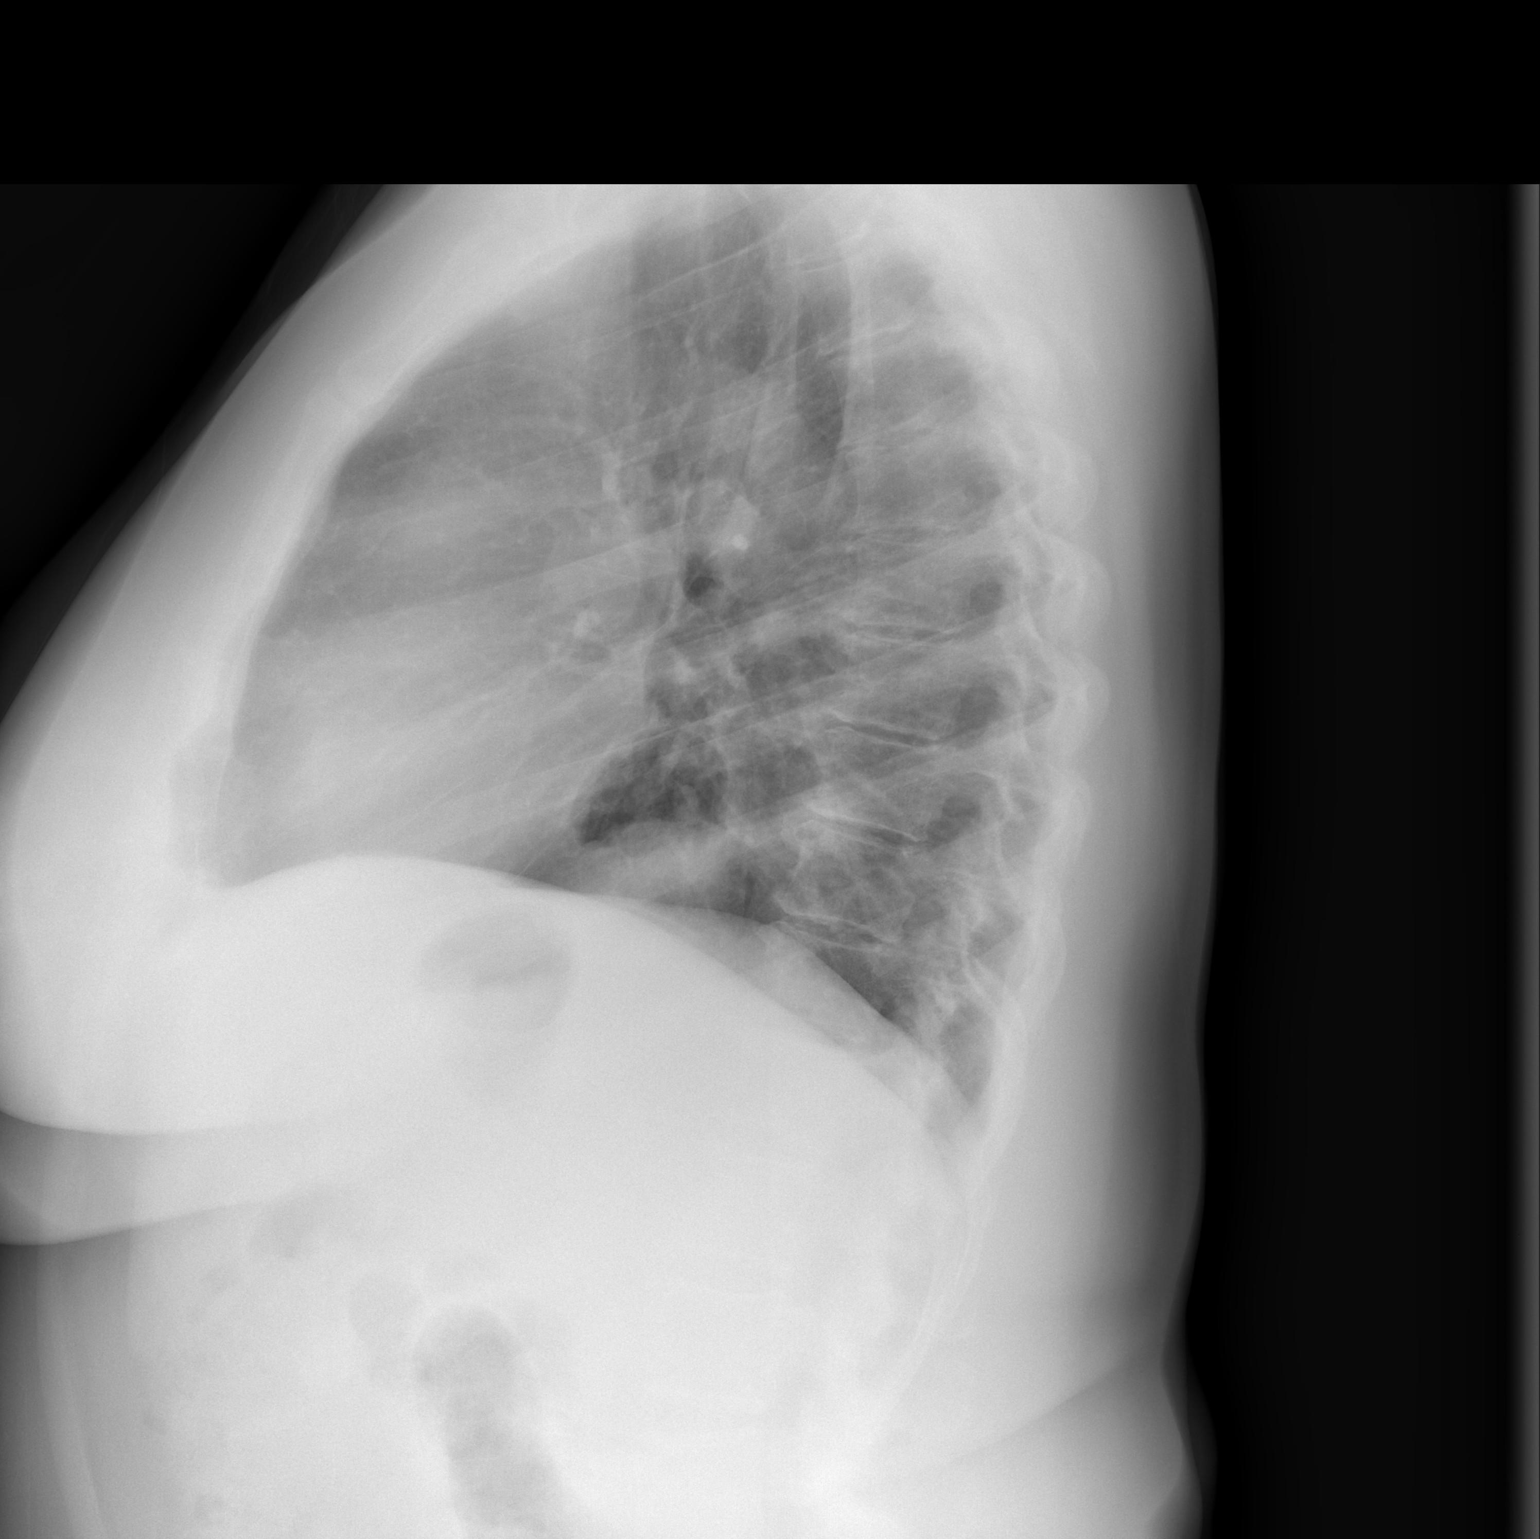

[2 of 2 positions shown; findings below may reference images not displayed]

FINDINGS: The heart size and mediastinal contours are within normal limits.
Both lungs are clear. No pneumothorax or pleural effusion is noted.
Moderate size hiatal hernia is noted. The visualized skeletal
structures are unremarkable.
IMPRESSION: No active cardiopulmonary disease.  Moderate size hiatal hernia.

## 2017-11-22 DIAGNOSIS — E119 Type 2 diabetes mellitus without complications: Secondary | ICD-10-CM | POA: Diagnosis not present

## 2017-11-22 DIAGNOSIS — H2513 Age-related nuclear cataract, bilateral: Secondary | ICD-10-CM | POA: Diagnosis not present

## 2017-11-24 ENCOUNTER — Other Ambulatory Visit: Payer: Self-pay | Admitting: Internal Medicine

## 2018-01-10 ENCOUNTER — Telehealth: Payer: Self-pay | Admitting: *Deleted

## 2018-01-10 NOTE — Telephone Encounter (Signed)
Patient called and scheduled an appt for a follow up appt on February 25th at 1:15pm.

## 2018-01-11 ENCOUNTER — Other Ambulatory Visit: Payer: Self-pay

## 2018-01-11 ENCOUNTER — Ambulatory Visit (AMBULATORY_SURGERY_CENTER): Payer: Self-pay | Admitting: *Deleted

## 2018-01-11 VITALS — Ht 63.0 in | Wt 203.0 lb

## 2018-01-11 DIAGNOSIS — Z1211 Encounter for screening for malignant neoplasm of colon: Secondary | ICD-10-CM

## 2018-01-11 MED ORDER — NA SULFATE-K SULFATE-MG SULF 17.5-3.13-1.6 GM/177ML PO SOLN: ORAL | 0 refills | Status: DC

## 2018-01-11 NOTE — Progress Notes (Signed)
Patient denies any allergies to eggs or soy. Patient denies any problems with sedation, does have post-op n&v. Patient denies any oxygen use at home. Patient denies taking any diet/weight loss medications or blood thinners. EMMI education assisgned to patient on colonoscopy, this was explained and instructions given to patient.

## 2018-01-23 ENCOUNTER — Encounter: Payer: Self-pay | Admitting: Gastroenterology

## 2018-01-23 ENCOUNTER — Ambulatory Visit (AMBULATORY_SURGERY_CENTER): Payer: Medicare Other | Admitting: Gastroenterology

## 2018-01-23 VITALS — BP 94/52 | HR 64 | Temp 98.0°F | Resp 18 | Ht 63.0 in | Wt 203.0 lb

## 2018-01-23 DIAGNOSIS — K635 Polyp of colon: Secondary | ICD-10-CM

## 2018-01-23 DIAGNOSIS — D125 Benign neoplasm of sigmoid colon: Secondary | ICD-10-CM

## 2018-01-23 DIAGNOSIS — D214 Benign neoplasm of connective and other soft tissue of abdomen: Secondary | ICD-10-CM | POA: Diagnosis not present

## 2018-01-23 DIAGNOSIS — Z1211 Encounter for screening for malignant neoplasm of colon: Secondary | ICD-10-CM | POA: Diagnosis not present

## 2018-01-23 DIAGNOSIS — E119 Type 2 diabetes mellitus without complications: Secondary | ICD-10-CM | POA: Diagnosis not present

## 2018-01-23 DIAGNOSIS — Z1212 Encounter for screening for malignant neoplasm of rectum: Secondary | ICD-10-CM | POA: Diagnosis not present

## 2018-01-23 DIAGNOSIS — D122 Benign neoplasm of ascending colon: Secondary | ICD-10-CM

## 2018-01-23 MED ORDER — SODIUM CHLORIDE 0.9 % IV SOLN: 500.0000 mL | Freq: Once | INTRAVENOUS | Status: AC

## 2018-01-23 NOTE — Op Note (Signed)
[place] Patient Name: Angela Mays Procedure Date: 01/23/2018 10:48 AM MRN: [id number] Endoscopist: Mauri Pole , MD Age: 67 Referring MD:  Date of Birth: 08/20/1951 Gender: Female Account #: [id number] Procedure:                Colonoscopy Indications:              Screening for colorectal malignant neoplasm.                            Maternal Grandmother with colon cancer at age 50.                            Personal history of endometrial cancer. Sister with                            breast cancer. Medicines:                Monitored Anesthesia Care Procedure:                Pre-Anesthesia Assessment:                           - Prior to the procedure, a History and Physical                            was performed, and patient medications and                            allergies were reviewed. The patient's tolerance of                            previous anesthesia was also reviewed. The risks                            and benefits of the procedure and the sedation                            options and risks were discussed with the patient.                            All questions were answered, and informed consent                            was obtained. Prior Anticoagulants: The patient has                            taken no previous anticoagulant or antiplatelet                            agents. ASA Grade Assessment: II - A patient with                            mild systemic disease. After reviewing the risks  and benefits, the patient was deemed in                            satisfactory condition to undergo the procedure.                           After obtaining informed consent, the colonoscope                            was passed under direct vision. Throughout the                            procedure, the patient's blood pressure, pulse, and                            oxygen saturations were monitored  continuously. The                            Model PCF-H190DL [id number]) scope was introduced                            through the anus and advanced to the the cecum,                            identified by appendiceal orifice and ileocecal                            valve. The colonoscopy was performed without                            difficulty. The patient tolerated the procedure                            well. The quality of the bowel preparation was                            excellent. The ileocecal valve, appendiceal                            orifice, and rectum were photographed. Scope In: 10:56:47 AM Scope Out: 11:14:30 AM Scope Withdrawal Time: 0 hours 11 minutes 55 seconds  Total Procedure Duration: 0 hours 17 minutes 43 seconds  Findings:                 The perianal and digital rectal examinations were                            normal.                           A 4 mm polyp was found in the ascending colon. The                            polyp was sessile. The polyp was removed with a  cold snare. Resection and retrieval were complete.                           A 6 mm polyp was found in the sigmoid colon. The                            polyp was sessile. The polyp was removed with a                            cold snare. Resection and retrieval were complete.                           Non-bleeding internal hemorrhoids were found during                            retroflexion. The hemorrhoids were small.                           The exam was otherwise without abnormality. Complications:            No immediate complications. Estimated Blood Loss:     Estimated blood loss was minimal. Impression:               - One 4 mm polyp in the ascending colon, removed                            with a cold snare. Resected and retrieved.                           - One 6 mm polyp in the sigmoid colon, removed with                            a cold  snare. Resected and retrieved.                           - Non-bleeding internal hemorrhoids.                           - The examination was otherwise normal. Recommendation:           - Patient has a contact number available for                            emergencies. The signs and symptoms of potential                            delayed complications were discussed with the                            patient. Return to normal activities tomorrow.                            Written discharge instructions were provided to the  patient.                           - Resume previous diet.                           - Continue present medications.                           - Await pathology results.                           - Repeat colonoscopy in 5 years for surveillance                            based on pathology results. Mauri Pole, MD 01/23/2018 11:20:25 AM This report has been signed electronically.

## 2018-01-23 NOTE — Progress Notes (Signed)
Called to room to assist during endoscopic procedure.  Patient ID and intended procedure confirmed with present staff. Received instructions for my participation in the procedure from the performing physician.  

## 2018-01-23 NOTE — Patient Instructions (Signed)
Impression/Recommedations:    Polyps: handout given Hemorrhoids:  Handout given  YOU HAD AN ENDOSCOPIC PROCEDURE TODAY AT [facility]:   Refer to the procedure report that was given to you for any specific questions about what was found during the examination.  If the procedure report does not answer your questions, please call your gastroenterologist to clarify.  If you requested that your care partner not be given the details of your procedure findings, then the procedure report has been included in a sealed envelope for you to review at your convenience later.  YOU SHOULD EXPECT: Some feelings of bloating in the abdomen. Passage of more gas than usual.  Walking can help get rid of the air that was put into your GI tract during the procedure and reduce the bloating. If you had a lower endoscopy (such as a colonoscopy or flexible sigmoidoscopy) you may notice spotting of blood in your stool or on the toilet paper. If you underwent a bowel prep for your procedure, you may not have a normal bowel movement for a few days.  Please Note:  You might notice some irritation and congestion in your nose or some drainage.  This is from the oxygen used during your procedure.  There is no need for concern and it should clear up in a day or so.  SYMPTOMS TO REPORT IMMEDIATELY:   Following lower endoscopy (colonoscopy or flexible sigmoidoscopy):  Excessive amounts of blood in the stool  Significant tenderness or worsening of abdominal pains  Swelling of the abdomen that is new, acute  Fever of 100F or higher  For urgent or emergent issues, a gastroenterologist can be reached at any hour by calling [phone].   DIET:  We do recommend a small meal at first, but then you may proceed to your regular diet.  Drink plenty of fluids but you should avoid alcoholic beverages for 24 hours.  ACTIVITY:  You should plan to take it easy for the rest of today and you should NOT DRIVE or use heavy  machinery until tomorrow (because of the sedation medicines used during the test).    FOLLOW UP: Our staff will call the number listed on your records the next business day following your procedure to check on you and address any questions or concerns that you may have regarding the information given to you following your procedure. If we do not reach you, we will leave a message.  However, if you are feeling well and you are not experiencing any problems, there is no need to return our call.  We will assume that you have returned to your regular daily activities without incident.  If any biopsies were taken you will be contacted by phone or by letter within the next 1-3 weeks.  Please call us at [phone] if you have not heard about the biopsies in 3 weeks.    SIGNATURES/CONFIDENTIALITY: You and/or your care partner have signed paperwork which will be entered into your electronic medical record.  These signatures attest to the fact that that the information above on your After Visit Summary has been reviewed and is understood.  Full responsibility of the confidentiality of this discharge information lies with you and/or your care-partner.

## 2018-01-23 NOTE — Progress Notes (Signed)
Report given to PACU, vss 

## 2018-01-24 ENCOUNTER — Telehealth: Payer: Self-pay

## 2018-01-24 NOTE — Telephone Encounter (Signed)
  Follow up Call-  Call back number 01/23/2018  Post procedure Call Back phone  # [phone]  Permission to leave phone message Yes  Some recent data might be hidden     Patient questions:  Do you have a fever, pain , or abdominal swelling? No. Pain Score  0 *  Have you tolerated food without any problems? Yes.    Have you been able to return to your normal activities? Yes.    Do you have any questions about your discharge instructions: Diet   No. Medications  No. Follow up visit  No.  Do you have questions or concerns about your Care? No.  Actions: * If pain score is 4 or above: No action needed, pain <4.

## 2018-01-30 ENCOUNTER — Encounter: Payer: Self-pay | Admitting: Gastroenterology

## 2018-02-02 ENCOUNTER — Ambulatory Visit: Payer: Medicare Other | Admitting: Internal Medicine

## 2018-02-13 ENCOUNTER — Inpatient Hospital Stay: Payer: Medicare Other | Attending: Gynecologic Oncology | Admitting: Gynecologic Oncology

## 2018-02-13 ENCOUNTER — Encounter: Payer: Self-pay | Admitting: Gynecologic Oncology

## 2018-02-13 VITALS — BP 140/78 | HR 78 | Temp 98.4°F | Resp 18 | Wt 205.3 lb

## 2018-02-13 DIAGNOSIS — C541 Malignant neoplasm of endometrium: Secondary | ICD-10-CM | POA: Diagnosis not present

## 2018-02-13 DIAGNOSIS — Z90710 Acquired absence of both cervix and uterus: Secondary | ICD-10-CM | POA: Diagnosis not present

## 2018-02-13 DIAGNOSIS — Z90722 Acquired absence of ovaries, bilateral: Secondary | ICD-10-CM | POA: Insufficient documentation

## 2018-02-13 DIAGNOSIS — B3749 Other urogenital candidiasis: Secondary | ICD-10-CM | POA: Insufficient documentation

## 2018-02-13 MED ORDER — NYSTATIN 100000 UNIT/GM EX POWD: Freq: Two times a day (BID) | CUTANEOUS | 0 refills | Status: DC

## 2018-02-13 NOTE — Patient Instructions (Signed)
Please notify Dr Denman George at phone number [phone] if you notice vaginal bleeding, new pelvic or abdominal pains, bloating, feeling full easy, or a change in bladder or bowel function.  Please use nystatin powder on areas of itch twice a day.  Please contact Dr Serita Grit office in the early summer of 2019 to schedule an appointment to see her in September, 2019.  You will see Dr Matthew Saras annually in March.

## 2018-02-13 NOTE — Progress Notes (Signed)
Consult Note: Gyn-Onc  Consult was requested by Dr. Matthew Saras for the evaluation of Angela Mays 67 y.o. female  CC:  Chief Complaint  Patient presents with  . Endometrial carcinoma [facility])    Assessment/Plan:  Angela Mays  is a 68 y.o.  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  Pathology revealed low risk factors for recurrence, therefore no adjuvant therapy is recommended according to NCCN guidelines.  I discussed risk for recurrence and typical symptoms encouraged her to notify us of these should they develop between visits.  I recommend she continue to have follow-up every 6 months for 5 years in accordance with NCCN guidelines. Those visits should include symptom assessment, physical exam and pelvic examination. Pap smears are not indicated or recommended in the routine surveillance of endometrial cancer.  She will follow-up with Dr Matthew Saras in March and myself in September. We will then get on to more intervalled visits q 6 months.   For her vulvar and abdominal canididiasis I am prescribing nystatin powder.   HPI: Angela Mays is a very pleasant 67 year old woman who is seen in consultation at the request of Dr Matthew Saras for grade 1 endometrial cancer.  The patient reports postmenopausal bleeding that began in late September 2017. It was very light. She saw Dr Matthew Saras and on 09/22/16 she had a sonohysterogram which showed a uterus measuring 7.5x4.2x5.9cm with a 68m endometrial stripe. She was taken to the OR on 11/02/16 and a hysteroscopy D&C was performed with myosure resection of the the polyp. It showed extensive FIGO grade 1 endometrial cancer.  The patient reports having a sister with breast cancer in there 775'sand a paternal grandfather with colon cancer.  She has had only a prior tubal  ligation.  She is morbidly obese. Her BMI is 40kg/m2.  She has poorly controlled DM secondary to admitted noncompliance with meds that give her diarrhea.   Interval Hx:  On 01/04/17 she underwent robotic assisted total hysterectomy, BSO and SLN biopsy for her endometrial cancer. Pathology showed a 5cm tumor with 0.6 of 2.6 (25%) myometrial invasion, no LVSI, grade 1 tumor and no cervical or adnexal involvement. The nodes were negative. IHC testing of the tumor revealed abnormalities in mismatch repair genes MLH1 and PMS2.  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 It was not felt to be associated with an increased risk for hereditary cancer syndromes.   She has no symptoms concerning for recurrence. She is on Jardiance for diabetes and has itch in the abdominal pannus fold and labia majora.   Current Meds:  Outpatient Encounter Medications as of 02/13/2018  Medication Sig  . empagliflozin (JARDIANCE) 25 MG TABS tablet Take 25 mg by mouth daily.  .Marland Kitchenglimepiride (AMARYL) 2 MG tablet Take 1 tablet (2 mg total) by mouth daily before supper. (Patient taking differently: Take 2 mg by mouth 2 (two) times daily. )  . metFORMIN (GLUCOPHAGE-XR) 500 MG 24 hr tablet Take 2 tablets (1,000 mg total) by mouth 2 (two) times daily.   Facility-Administered Encounter Medications as of 02/13/2018  Medication  . 0.9 %  sodium chloride infusion    Allergy:  Allergies  Allergen Reactions  . Flagyl [Metronidazole] Rash    Social Hx:   Social History   Socioeconomic History  . Marital status: Widowed  Spouse name: Not on file  . Number of children: Not on file  . Years of education: Not on file  . Highest education level: Not on file  Social Needs  . Financial resource strain: Not on file  . Food insecurity - worry: Not on file  . Food insecurity - inability: Not on file  . Transportation needs - medical: Not on file  . Transportation needs - non-medical: Not on file   Occupational History  . Not on file  Tobacco Use  . Smoking status: Never Smoker  . Smokeless tobacco: Never Used  Substance and Sexual Activity  . Alcohol use: No  . Drug use: No  . Sexual activity: Not on file  Other Topics Concern  . Not on file  Social History Narrative  . Not on file    Past Surgical Hx:  Past Surgical History:  Procedure Laterality Date  . COLONOSCOPY  done at AGE 71   w/Dr.Mann=normal exam  . DILATATION & CURETTAGE/HYSTEROSCOPY WITH MYOSURE N/A 11/02/2016   Procedure: DILATATION & CURETTAGE/HYSTEROSCOPY WITH MYOSURE;  Surgeon: Molli Posey, MD;  Location: [facility];  Service: Gynecology;  Laterality: N/A;  . DILATION AND CURETTAGE OF UTERUS    . LYMPH NODE BIOPSY N/A 01/04/2017   Procedure: SENTINEL LYMPH NODE BIOPSY;  Surgeon: Everitt Amber, MD;  Location: [facility];  Service: Gynecology;  Laterality: N/A;  . ROBOTIC ASSISTED TOTAL HYSTERECTOMY WITH BILATERAL SALPINGO OOPHERECTOMY Bilateral 01/04/2017   Procedure: XI ROBOTIC ASSISTED TOTAL HYSTERECTOMY WITH BILATERAL SALPINGO OOPHORECTOMY;  Surgeon: Everitt Amber, MD;  Location: [facility];  Service: Gynecology;  Laterality: Bilateral;  . TUBAL LIGATION  YRS AGO    Past Medical Hx:  Past Medical History:  Diagnosis Date  . Blood transfusion without reported diagnosis at birth   . Cancer ([place]) 12/2016   ENDOMETRIAL CANCER  . Diabetes mellitus without complication ([place])    TYPE 2  . Genetic testing 04/07/2017   Ms. Muns underwent genetic counseling and testing for hereditary cancer syndromes on 03/24/2017. There were no pathogenic mutations identified in any of the 46 genes analyzed by [facility]'s 46-gene Common Hereditary Cancers Panel. Genes analyzed include: APC, ATM, AXIN2, BARD1, BMPR1A, BRCA1, BRCA2, BRIP1, CDH1, CDKN2A, CHEK2, CTNNA1, DICER1, EPCAM, GREM1, HOXB13, KIT, MEN1, MLH1, MSH2, MSH3, MSH6  . Leg cramps    and feet OCC  . Pneumonia YRS AGO  . PONV (postoperative nausea and vomiting)    NAUSEA  .  Skin rash 10/21/2016   CONTACT DERMITITIS OCC    Past Gynecological History:  SVD x 2 No LMP recorded. Patient is postmenopausal.  Family Hx:  Family History  Problem Relation Age of Onset  . Heart Problems Father   . Cervical cancer Sister 30       full sister  . High blood pressure Son   . High blood pressure Son   . Breast cancer Sister 70       maternal half sister  . Melanoma Sister   . Bone cancer Mother 28       d.69  . Stomach cancer Maternal Grandfather 60       d.60s  . Colon cancer Paternal Grandmother 54       d.84  . Kidney cancer Paternal Grandfather 64       d.58    Review of Systems:  Constitutional  Feels well,    ENT Normal appearing ears and nares bilaterally Skin/Breast  + pruritis labia majora and abdominal pannus  Cardiovascular  No chest  pain, shortness of breath, or edema  Pulmonary  No cough or wheeze.  Gastro Intestinal  No nausea, vomitting, or diarrhoea. No bright red blood per rectum, no abdominal pain, change in bowel movement, or constipation.  Genito Urinary  No frequency, urgency, dysuria, no bleeding Musculo Skeletal  No myalgia, arthralgia, joint swelling or pain  Neurologic  No weakness, numbness, change in gait,  Psychology  No depression, anxiety, insomnia.   Vitals:  Blood pressure 140/78, pulse 78, temperature 98.4 F (36.9 C), temperature source Oral, resp. rate 18, weight 205 lb 4.8 oz (93.1 kg), SpO2 98 %.  Physical Exam: WD in NAD Neck  Supple NROM, without any enlargements.  Lymph Node Survey No cervical supraclavicular or inguinal adenopathy Cardiovascular  Pulse normal rate, regularity and rhythm. S1 and S2 normal.  Lungs  Clear to auscultation bilateraly, without wheezes/crackles/rhonchi. Good air movement.  Skin  + candidiasis rash in abdominal pannus and labia majora Psychiatry  Alert and oriented to person, place, and time  Abdomen  Normoactive bowel sounds, abdomen soft, non-tender and obese  without evidence of hernia. Incisions well healed no palpable masses. Back No CVA tenderness Genito Urinary : no lesions or masses in vagina, and non appreciated on bimanual exam.  Rectal  deferred Extremities  No bilateral cyanosis, clubbing or edema.  Donaciano Eva, MD  02/13/2018, 1:59 PM

## 2018-02-22 ENCOUNTER — Inpatient Hospital Stay: Payer: Medicare Other | Attending: Gynecologic Oncology

## 2018-02-22 ENCOUNTER — Telehealth: Payer: Self-pay | Admitting: *Deleted

## 2018-02-22 DIAGNOSIS — C541 Malignant neoplasm of endometrium: Secondary | ICD-10-CM | POA: Insufficient documentation

## 2018-02-22 DIAGNOSIS — R310 Gross hematuria: Secondary | ICD-10-CM | POA: Diagnosis not present

## 2018-02-22 LAB — URINALYSIS, COMPLETE (UACMP) WITH MICROSCOPIC
BILIRUBIN URINE: NEGATIVE
Ketones, ur: NEGATIVE mg/dL
NITRITE: NEGATIVE
PH: 5 (ref 5.0–8.0)
Protein, ur: NEGATIVE mg/dL
Specific Gravity, Urine: 1.027 (ref 1.005–1.030)

## 2018-02-22 NOTE — Telephone Encounter (Signed)
Patient called and left a message that "Monday night I started having some blood in my urine and urine frequency. I wanted to see if Dr. Denman George would call me in an antibiotic?'  Per Melissa APP patient needs to come and give a urine sample, go to an urgent care, or call her PCP. Called and left the patient a message that we needed her to come in and give a sample, to please call the office.

## 2018-02-23 ENCOUNTER — Telehealth: Payer: Self-pay

## 2018-02-23 ENCOUNTER — Other Ambulatory Visit: Payer: Self-pay

## 2018-02-23 DIAGNOSIS — C541 Malignant neoplasm of endometrium: Secondary | ICD-10-CM

## 2018-02-23 DIAGNOSIS — E1165 Type 2 diabetes mellitus with hyperglycemia: Secondary | ICD-10-CM

## 2018-02-23 MED ORDER — SULFAMETHOXAZOLE-TRIMETHOPRIM 800-160 MG PO TABS: 1.0000 | ORAL_TABLET | Freq: Two times a day (BID) | ORAL | 0 refills | Status: DC

## 2018-02-23 NOTE — Progress Notes (Signed)
Verbal orders received from Joylene John NP for Bactrim.

## 2018-02-23 NOTE — Telephone Encounter (Signed)
Notified pt by phone of U/A results and per Joylene John NP, pt agrees she is taking Jardiance, made her aware per [facility] NP "glucose in urine which can cause bacteria to grow".  Discussed can start Prescription for Bactrim twice a day for 5 days but made her aware that possibility once culture comes back, Bactrim may have to be changed to another antibiotic to cover what bacteria the culture shows. Pt voiced understanding.  Pt states her only allergy is Flagyl and has tolerated antibiotics in the past. Melissa NP will call in prescription of Bactrim to [facility] pharmacy at [facility]. No other needs per pt at this time.

## 2018-02-24 LAB — URINE CULTURE

## 2018-02-27 DIAGNOSIS — Z1231 Encounter for screening mammogram for malignant neoplasm of breast: Secondary | ICD-10-CM | POA: Diagnosis not present

## 2018-02-27 DIAGNOSIS — Z01419 Encounter for gynecological examination (general) (routine) without abnormal findings: Secondary | ICD-10-CM | POA: Diagnosis not present

## 2018-02-27 DIAGNOSIS — Z6834 Body mass index (BMI) 34.0-34.9, adult: Secondary | ICD-10-CM | POA: Diagnosis not present

## 2018-03-02 ENCOUNTER — Ambulatory Visit (INDEPENDENT_AMBULATORY_CARE_PROVIDER_SITE_OTHER): Payer: Medicare Other | Admitting: Internal Medicine

## 2018-03-02 ENCOUNTER — Encounter: Payer: Self-pay | Admitting: Internal Medicine

## 2018-03-02 VITALS — BP 142/86 | HR 74 | Ht 63.0 in | Wt 201.4 lb

## 2018-03-02 DIAGNOSIS — E1165 Type 2 diabetes mellitus with hyperglycemia: Secondary | ICD-10-CM | POA: Diagnosis not present

## 2018-03-02 DIAGNOSIS — Z6835 Body mass index (BMI) 35.0-35.9, adult: Secondary | ICD-10-CM

## 2018-03-02 LAB — POCT GLYCOSYLATED HEMOGLOBIN (HGB A1C): Hemoglobin A1C: 6.4

## 2018-03-02 MED ORDER — GLIMEPIRIDE 2 MG PO TABS: 2.0000 mg | ORAL_TABLET | Freq: Every day | ORAL | 3 refills | Status: DC

## 2018-03-02 NOTE — Progress Notes (Signed)
Patient ID: Angela Mays, female   DOB: [birth year], 67 y.o.   MRN: [id number]   HPI: Angela Mays is a 67 y.o.-year-old female, returning for f/u for DM2, dx in 09/2014, non-insulin-dependent, uncontrolled, without long term complications. Last visit 5 mo ago.   She had a UTI since last visit >> was on Bactrim last week > resolved.  Last hemoglobin A1c was: Lab Results  Component Value Date   HGBA1C 6.1 10/04/2017   HGBA1C 6.5 07/05/2017   HGBA1C 6.2 03/29/2017   Pt is on: - Metformin XR 1000 mg 2x a day with meals - Glimepiride 2 mg before breakfast and dinner  If you have a particularly larger meal, you may take 2 tablets before that meal. - Jardiance 25 mg daily before first meal of the day -  started 01/2017  Pt is checking sugars 0-1x a day: - am: n/c >> 88-121, 226 (forgot med) >> 104-143, 159, 170 (x1, ate at night) - 2h after b'fast: n/c - before lunch: 141-206, 254 >> n/c >> 87 >> 75-129 - 2h after lunch: n/c >> 115, 173 >> n/c >> 127 - before dinner: n/c >> 59-92 >> n/c - 2h after dinner: 178, 292 >> 293 (out of town) >> n/c - bedtime: n/c - nighttime: n/c >> 95-205 >> 130 >> n/c  Glucometer: One Touch ultra mini  Pt's meals are: - Breakfast: oatmeal or PB sandwich or bacon and eggs or cereals or skips during the week - Lunch: sandwich or spaghetti or fast foods - Dinner: largest - meat + veggies + starch - Snacks: 2-3: less chocolate, potato chips,  M&M Drinks sweet tea occasionally.  -No CKD, last BUN/creatinine:  Lab Results  Component Value Date   BUN 19 03/29/2017   BUN 13 12/30/2016   CREATININE 0.87 03/29/2017   CREATININE 0.70 12/30/2016  03/29/2017: ACR undet. Not on an ACE inhibitor. - + HL; last set of lipids: 03/29/2017: 193/56/63/119 No results found for: CHOL, HDL, LDLCALC, LDLDIRECT, TRIG, CHOLHDL  Not on a statin.  - last eye exam was in 11/2017: No DR, + L cataract  -No numbness and tingling in her feet.  Of note, patient has a  new diagnosis of endometrial cancer >> Had hysterectomy.   ROS: Constitutional: no weight gain/no weight loss, no fatigue, no subjective hyperthermia, no subjective hypothermia, + dysuria >> resolved, still some skin itching Eyes: no blurry vision, no xerophthalmia ENT: no sore throat, no nodules palpated in throat, no dysphagia, no odynophagia, no hoarseness Cardiovascular: no CP/no SOB/no palpitations/no leg swelling Respiratory: no cough/no SOB/no wheezing Gastrointestinal: no N/no V/no D/no C/no acid reflux Musculoskeletal: no muscle aches/no joint aches Skin: no rashes, no hair loss Neurological: no tremors/no numbness/no tingling/no dizziness  I reviewed pt's medications, allergies, PMH, social hx, family hx, and changes were documented in the history of present illness. Otherwise, unchanged from my initial visit note.  Past Medical History:  Diagnosis Date  . Blood transfusion without reported diagnosis at birth   . Cancer ([place]) 12/2016   ENDOMETRIAL CANCER  . Diabetes mellitus without complication ([place])    TYPE 2  . Genetic testing 04/07/2017   Ms. Bulluck underwent genetic counseling and testing for hereditary cancer syndromes on 03/24/2017. There were no pathogenic mutations identified in any of the 46 genes analyzed by [facility]'s 46-gene Common Hereditary Cancers Panel. Genes analyzed include: APC, ATM, AXIN2, BARD1, BMPR1A, BRCA1, BRCA2, BRIP1, CDH1, CDKN2A, CHEK2, CTNNA1, DICER1, EPCAM, GREM1, HOXB13, KIT, MEN1, MLH1, MSH2, MSH3,  MSH6  . Leg cramps    and feet OCC  . Pneumonia YRS AGO  . PONV (postoperative nausea and vomiting)    NAUSEA  . Skin rash 10/21/2016   CONTACT DERMITITIS OCC   Past Surgical History:  Procedure Laterality Date  . COLONOSCOPY  done at AGE 67   w/Dr.Mann=normal exam  . DILATATION & CURETTAGE/HYSTEROSCOPY WITH MYOSURE N/A 11/02/2016   Procedure: DILATATION & CURETTAGE/HYSTEROSCOPY WITH MYOSURE;  Surgeon: Molli Posey, MD;  Location: [facility];   Service: Gynecology;  Laterality: N/A;  . DILATION AND CURETTAGE OF UTERUS    . LYMPH NODE BIOPSY N/A 01/04/2017   Procedure: SENTINEL LYMPH NODE BIOPSY;  Surgeon: Everitt Amber, MD;  Location: [facility];  Service: Gynecology;  Laterality: N/A;  . ROBOTIC ASSISTED TOTAL HYSTERECTOMY WITH BILATERAL SALPINGO OOPHERECTOMY Bilateral 01/04/2017   Procedure: XI ROBOTIC ASSISTED TOTAL HYSTERECTOMY WITH BILATERAL SALPINGO OOPHORECTOMY;  Surgeon: Everitt Amber, MD;  Location: [facility];  Service: Gynecology;  Laterality: Bilateral;  . TUBAL LIGATION  YRS AGO   Social History   Social History  . Marital status: Single    Spouse name: N/A  . Number of children: 2   Occupational History  . Glass blower/designer    Social History Main Topics  . Smoking status: Never Smoker  . Smokeless tobacco: Never Used  . Alcohol use No  . Drug use: No   Current Outpatient Medications on File Prior to Visit  Medication Sig Dispense Refill  . empagliflozin (JARDIANCE) 25 MG TABS tablet Take 25 mg by mouth daily. 90 tablet 3  . glimepiride (AMARYL) 2 MG tablet Take 1 tablet (2 mg total) by mouth daily before supper. (Patient taking differently: Take 2 mg by mouth 2 (two) times daily. ) 90 tablet 3  . metFORMIN (GLUCOPHAGE-XR) 500 MG 24 hr tablet Take 2 tablets (1,000 mg total) by mouth 2 (two) times daily. 360 tablet 3  . nystatin (MYCOSTATIN/NYSTOP) powder Apply topically 2 (two) times daily. To abdominal crease and skin of vulva 15 g 0  . sulfamethoxazole-trimethoprim (BACTRIM DS,SEPTRA DS) 800-160 MG tablet Take 1 tablet by mouth 2 (two) times daily. 10 tablet 0   Current Facility-Administered Medications on File Prior to Visit  Medication Dose Route Frequency Provider Last Rate Last Dose  . 0.9 %  sodium chloride infusion  500 mL Intravenous Once Nandigam, Venia Minks, MD       Allergies  Allergen Reactions  . Flagyl [Metronidazole] Rash   Family History  Problem Relation Age of Onset  . Heart Problems Father   .  Cervical cancer Sister 27       full sister  . High blood pressure Son   . High blood pressure Son   . Breast cancer Sister 91       maternal half sister  . Melanoma Sister   . Bone cancer Mother 62       d.69  . Stomach cancer Maternal Grandfather 60       d.60s  . Colon cancer Paternal Grandmother 44       d.84  . Kidney cancer Paternal Grandfather 76       d.58   PE: BP (!) 142/86 (BP Location: Left Arm, Patient Position: Sitting, Cuff Size: Large)   Pulse 74   Ht '5\' 3"'  (1.6 m)   Wt 201 lb 6.4 oz (91.4 kg)   SpO2 96%   BMI 35.68 kg/m  Wt Readings from Last 3 Encounters:  03/02/18 201 lb 6.4 oz (91.4  kg)  02/13/18 205 lb 4.8 oz (93.1 kg)  01/23/18 203 lb (92.1 kg)   Constitutional: overweight, in NAD Eyes: PERRLA, EOMI, no exophthalmos ENT: moist mucous membranes, no thyromegaly, no cervical lymphadenopathy Cardiovascular: RRR, No MRG Respiratory: CTA B Gastrointestinal: abdomen soft, NT, ND, BS+ Musculoskeletal: no deformities, strength intact in all 4 Skin: moist, warm, no rashes Neurological: no tremor with outstretched hands, DTR normal in all 4  ASSESSMENT: 1. DM2, non-insulin-dependent, uncontrolled, without long term complications, but with hyperglycemia  2. Obesity class 2 BMI Classification:  < 18.5 underweight   18.5-24.9 normal weight   25.0-29.9 overweight   30.0-34.9 class I obesity   35.0-39.9 class II obesity   ? 40.0 class III obesity   PLAN:  1. Patient with history of uncontrolled diabetes, with initially significant improvement of her diabetes control after adding Jardiance and, at last visit, with continuous improvement due to changing her diet: Reduced the amount of snacks and also started to eat breakfast.  Her a.m. sugars were improved and they remained controlled throughout the day.  She had one low blood sugar in the 50s before dinner so we reduced the glimepiride dose before dinner at last visit. - sugars are higher in am  especially after eating out at night and also after sweet tea >> discussed to stop - OTW no changes needed for now. She is not using the higher dose of Glimepiride as this is dropping her sugars too low (had a CBG in the 50s during the night) - I suggested to:  Patient Instructions  Please continue: - Metformin XR 1000 mg 2x a day with meals - Jardiance 25 mg before first meal of the day - Glimepiride 2 mg before dinner only  Please return in 4 months with your sugar log.   - today, HbA1c is 6.4% (slightly higher) - continue checking sugars at different times of the day - check 1x a day, rotating checks - advised for yearly eye exams >> she is UTD - Return to clinic in 3-4 mo with sugar log   2. Obesity class 2 - She did not lose a significant amount of weight since last visit - discussed dietary changes   Philemon Kingdom, MD PhD [facility] Endocrinology

## 2018-03-02 NOTE — Patient Instructions (Addendum)
Please continue: - Metformin XR 1000 mg 2x a day with meals - Jardiance 25 mg before first meal of the day - Glimepiride 2 mg before dinner only  Please return in 4 months with your sugar log.

## 2018-05-10 DIAGNOSIS — E119 Type 2 diabetes mellitus without complications: Secondary | ICD-10-CM | POA: Diagnosis not present

## 2018-05-10 DIAGNOSIS — Z0000 Encounter for general adult medical examination without abnormal findings: Secondary | ICD-10-CM | POA: Diagnosis not present

## 2018-05-24 ENCOUNTER — Telehealth: Payer: Self-pay | Admitting: *Deleted

## 2018-05-24 NOTE — Telephone Encounter (Signed)
Returned the patient's call and scheduled a follow up appt for September 13th at 3:15pm

## 2018-06-12 ENCOUNTER — Other Ambulatory Visit: Payer: Self-pay | Admitting: Internal Medicine

## 2018-07-03 ENCOUNTER — Encounter: Payer: Self-pay | Admitting: Internal Medicine

## 2018-07-03 ENCOUNTER — Ambulatory Visit (INDEPENDENT_AMBULATORY_CARE_PROVIDER_SITE_OTHER): Payer: Medicare Other | Admitting: Internal Medicine

## 2018-07-03 VITALS — BP 120/86 | HR 78 | Ht 63.0 in | Wt 206.2 lb

## 2018-07-03 DIAGNOSIS — E1165 Type 2 diabetes mellitus with hyperglycemia: Secondary | ICD-10-CM | POA: Diagnosis not present

## 2018-07-03 DIAGNOSIS — Z6835 Body mass index (BMI) 35.0-35.9, adult: Secondary | ICD-10-CM | POA: Diagnosis not present

## 2018-07-03 LAB — COMPLETE METABOLIC PANEL WITH GFR
AG RATIO: 1.5 (calc) (ref 1.0–2.5)
ALBUMIN MSPROF: 4 g/dL (ref 3.6–5.1)
ALT: 15 U/L (ref 6–29)
AST: 14 U/L (ref 10–35)
Alkaline phosphatase (APISO): 96 U/L (ref 33–130)
BUN: 23 mg/dL (ref 7–25)
CO2: 26 mmol/L (ref 20–32)
Calcium: 9.3 mg/dL (ref 8.6–10.4)
Chloride: 105 mmol/L (ref 98–110)
Creat: 0.71 mg/dL (ref 0.50–0.99)
GFR, EST AFRICAN AMERICAN: 101 mL/min/{1.73_m2} (ref >=60)
GFR, EST NON AFRICAN AMERICAN: 88 mL/min/{1.73_m2} (ref >=60)
GLUCOSE: 94 mg/dL (ref 65–99)
Globulin: 2.7 g/dL (calc) (ref 1.9–3.7)
Potassium: 4.1 mmol/L (ref 3.5–5.3)
Sodium: 139 mmol/L (ref 135–146)
TOTAL PROTEIN: 6.7 g/dL (ref 6.1–8.1)
Total Bilirubin: 0.3 mg/dL (ref 0.2–1.2)

## 2018-07-03 LAB — LIPID PANEL
CHOLESTEROL: 200 mg/dL (ref 0–200)
HDL: 62.8 mg/dL (ref >39.00)
LDL Cholesterol: 120 mg/dL — ABNORMAL HIGH (ref 0–99)
NONHDL: 137.09
Total CHOL/HDL Ratio: 3
Triglycerides: 84 mg/dL (ref 0.0–149.0)
VLDL: 16.8 mg/dL (ref 0.0–40.0)

## 2018-07-03 LAB — MICROALBUMIN / CREATININE URINE RATIO
Creatinine,U: 89.6 mg/dL
MICROALB/CREAT RATIO: 0.8 mg/g (ref 0.0–30.0)
Microalb, Ur: <0.7 mg/dL (ref 0.0–1.9)

## 2018-07-03 LAB — POCT GLYCOSYLATED HEMOGLOBIN (HGB A1C): HEMOGLOBIN A1C: 6.7 % — AB (ref 4.0–5.6)

## 2018-07-03 NOTE — Patient Instructions (Addendum)
Please stop sweet tea.  Do not forget Glipizide 15-30 min before dinner.  Continue: - Metformin XR 1000 mg 2x a day with meals - Jardiance 25 mg before first meal of the day  - Glimepiride 2 mg before dinner only   Please return in 3 months with your sugar log.

## 2018-07-03 NOTE — Progress Notes (Addendum)
Patient ID: Angela Mays, female   DOB: 22-Oct-1951, 67 y.o.   MRN: [id number]   HPI: Angela Mays is a 68 y.o.-year-old female, returning for f/u for DM2, dx in 09/2014, non-insulin-dependent, uncontrolled, without long term complications. Last visit 4 months ago.  Last hemoglobin A1c was: Lab Results  Component Value Date   HGBA1C 6.4 03/02/2018   HGBA1C 6.1 10/04/2017   HGBA1C 6.5 07/05/2017   Pt is on: - Metformin XR 1000 mg 2x a day with meals - Jardiance 25 mg before first meal of the day - started 01/2017 - Glimepiride 2 mg before dinner only (forgets it frequently)   Pt is checking sugars 1X a day, rotating check times -she brings a very good log for review: - am:88-121, 226 >> 104-143, 159, 170 (x1, ate at night) >> 110-143 - 2h after b'fast: n/c >> 161-206, 251 - before lunch: 141-206, 254 >> n/c >> 87 >> 75-129 >> n/c - 2h after lunch: n/c >> 115, 173 >> n/c >> 127 >> n/c  - before dinner: n/c >> 59-92 >> n/c >> 78-187 - 2h after dinner: 178, 292 >> 293 (out of town) >> n/c >> 194--262 - bedtime: n/c - nighttime: n/c >> 95-205 >> 130 >> n/c  Glucometer: One Touch ultra mini  Pt's meals are: - Breakfast: oatmeal or PB sandwich or bacon and eggs or cereals or skips during the week - Lunch: sandwich or spaghetti or fast foods - Dinner: largest - meat + veggies + starch - Snacks: 2-3: less chocolate, potato chips,  M&M Still drinking sweet tea.  -No CKD, last BUN/creatinine:  Lab Results  Component Value Date   BUN 19 03/29/2017   BUN 13 12/30/2016   CREATININE 0.87 03/29/2017   CREATININE 0.70 12/30/2016  03/29/2017: ACR undet. Not not on an ACE inhibitor/ARB  -  + HL; last set of lipids: 03/29/2017: 193/56/63/119 No results found for: CHOL, HDL, LDLCALC, LDLDIRECT, TRIG, CHOLHDL  Not on a statin.  - last eye exam was in 11/2017: No DR, +L cataract  - no numbness and tingling in her feet.  Of note, patient has a new diagnosis of endometrial cancer >>  Had hysterectomy.   ROS: Constitutional: no weight gain/no weight loss, no fatigue, no subjective hyperthermia, no subjective hypothermia Eyes: no blurry vision, no xerophthalmia ENT: no sore throat, no nodules palpated in throat, no dysphagia, no odynophagia, no hoarseness Cardiovascular: no CP/no SOB/no palpitations/no leg swelling Respiratory: no cough/no SOB/no wheezing Gastrointestinal: no N/no V/no D/no C/no acid reflux Musculoskeletal: no muscle aches/no joint aches Skin: no rashes, no hair loss Neurological: no tremors/no numbness/no tingling/no dizziness  I reviewed pt's medications, allergies, PMH, social hx, family hx, and changes were documented in the history of present illness. Otherwise, unchanged from my initial visit note.  Past Medical History:  Diagnosis Date  . Blood transfusion without reported diagnosis at birth   . Cancer ([place]) 12/2016   ENDOMETRIAL CANCER  . Diabetes mellitus without complication ([place])    TYPE 2  . Genetic testing 04/07/2017   Ms. Ishmael underwent genetic counseling and testing for hereditary cancer syndromes on 03/24/2017. There were no pathogenic mutations identified in any of the 46 genes analyzed by [facility]'s 46-gene Common Hereditary Cancers Panel. Genes analyzed include: APC, ATM, AXIN2, BARD1, BMPR1A, BRCA1, BRCA2, BRIP1, CDH1, CDKN2A, CHEK2, CTNNA1, DICER1, EPCAM, GREM1, HOXB13, KIT, MEN1, MLH1, MSH2, MSH3, MSH6  . Leg cramps    and feet OCC  . Pneumonia YRS AGO  .  PONV (postoperative nausea and vomiting)    NAUSEA  . Skin rash 10/21/2016   CONTACT DERMITITIS OCC   Past Surgical History:  Procedure Laterality Date  . COLONOSCOPY  done at AGE 67   w/Dr.Mann=normal exam  . DILATATION & CURETTAGE/HYSTEROSCOPY WITH MYOSURE N/A 11/02/2016   Procedure: DILATATION & CURETTAGE/HYSTEROSCOPY WITH MYOSURE;  Surgeon: Molli Posey, MD;  Location: [facility];  Service: Gynecology;  Laterality: N/A;  . DILATION AND CURETTAGE OF UTERUS    . LYMPH  NODE BIOPSY N/A 01/04/2017   Procedure: SENTINEL LYMPH NODE BIOPSY;  Surgeon: Everitt Amber, MD;  Location: [facility];  Service: Gynecology;  Laterality: N/A;  . ROBOTIC ASSISTED TOTAL HYSTERECTOMY WITH BILATERAL SALPINGO OOPHERECTOMY Bilateral 01/04/2017   Procedure: XI ROBOTIC ASSISTED TOTAL HYSTERECTOMY WITH BILATERAL SALPINGO OOPHORECTOMY;  Surgeon: Everitt Amber, MD;  Location: [facility];  Service: Gynecology;  Laterality: Bilateral;  . TUBAL LIGATION  YRS AGO   Social History   Social History  . Marital status: Single    Spouse name: N/A  . Number of children: 2   Occupational History  . Glass blower/designer    Social History Main Topics  . Smoking status: Never Smoker  . Smokeless tobacco: Never Used  . Alcohol use No  . Drug use: No   Current Outpatient Medications on File Prior to Visit  Medication Sig Dispense Refill  . empagliflozin (JARDIANCE) 25 MG TABS tablet Take 25 mg by mouth daily. 90 tablet 3  . glimepiride (AMARYL) 2 MG tablet Take 1 tablet (2 mg total) by mouth daily before supper. 90 tablet 3  . metFORMIN (GLUCOPHAGE-XR) 500 MG 24 hr tablet TAKE 2 TABLETS TWICE A DAY 360 tablet 3  . nystatin (MYCOSTATIN/NYSTOP) powder Apply topically 2 (two) times daily. To abdominal crease and skin of vulva (Patient not taking: Reported on 03/02/2018) 15 g 0  . sulfamethoxazole-trimethoprim (BACTRIM DS,SEPTRA DS) 800-160 MG tablet Take 1 tablet by mouth 2 (two) times daily. (Patient not taking: Reported on 03/02/2018) 10 tablet 0   Current Facility-Administered Medications on File Prior to Visit  Medication Dose Route Frequency Provider Last Rate Last Dose  . 0.9 %  sodium chloride infusion  500 mL Intravenous Once Nandigam, Venia Minks, MD       Allergies  Allergen Reactions  . Flagyl [Metronidazole] Rash   Family History  Problem Relation Age of Onset  . Heart Problems Father   . Cervical cancer Sister 110       full sister  . High blood pressure Son   . High blood pressure Son   . Breast  cancer Sister 90       maternal half sister  . Melanoma Sister   . Bone cancer Mother 80       d.69  . Stomach cancer Maternal Grandfather 60       d.60s  . Colon cancer Paternal Grandmother 61       d.84  . Kidney cancer Paternal Grandfather 30       d.58   PE: BP 120/86   Pulse 78   Ht '5\' 3"'  (1.6 m)   Wt 206 lb 3.2 oz (93.5 kg)   SpO2 95%   BMI 36.53 kg/m  Wt Readings from Last 3 Encounters:  07/03/18 206 lb 3.2 oz (93.5 kg)  03/02/18 201 lb 6.4 oz (91.4 kg)  02/13/18 205 lb 4.8 oz (93.1 kg)   Constitutional: overweight, in NAD Eyes: PERRLA, EOMI, no exophthalmos ENT: moist mucous membranes, no thyromegaly, no cervical  lymphadenopathy Cardiovascular: RRR, No MRG Respiratory: CTA B Gastrointestinal: abdomen soft, NT, ND, BS+ Musculoskeletal: no deformities, strength intact in all 4 Skin: moist, warm, no rashes Neurological: no tremor with outstretched hands, DTR normal in all 4  ASSESSMENT: 1. DM2, non-insulin-dependent, uncontrolled, without long term complications, but with hyperglycemia  2. Obesity class 2 BMI Classification:  < 18.5 underweight   18.5-24.9 normal weight   25.0-29.9 overweight   30.0-34.9 class I obesity   35.0-39.9 class II obesity   ? 40.0 class III obesity   PLAN:  1. Patient with history of uncontrolled diabetes, with initially significant improvement of her diabetes after adding Jardiance, and then with continuous improvement due to changing her diet.  Since then, before last visit sugar started to improve especially after eating out at night and also after sweet tea.  I strongly advised her to reduce these.  We did not make any changes in her diabetic regimen at last visit.  We have to stick with the lower dose of glimepiride as she was having hypoglycemia  in the 50s from higher doses. - At this visit, sugars are higher, and I believe that this is for 2 reasons:  First, she is still drinking a large amount of sweet tea (even 3  glasses per meal) and we discussed that this is greatly influencing her blood sugars and she absolutely needs to stop. Second, she frequently forgets the glimepiride before dinner >> discussed that it is important that she takes it 15 to 30 minutes before she eats -  if she stops drinking sweet tea, we may need to eliminate glimepiride altogether, but for now, we will continue with the 2 mg before dinner.  I will not suggest any other changes for now. - I suggested to:  Patient Instructions  Please stop sweet tea.  Do not forget Glipizide 15-30 min before dinner.  Continue: - Metformin XR 1000 mg 2x a day with meals - Jardiance 25 mg before first meal of the day  - Glimepiride 2 mg before dinner only   Please return in 3 months with your sugar log.   - today, HbA1c is 6.7% (higher) - continue checking sugars at different times of the day - check 1x a day, rotating checks - advised for yearly eye exams >> she is UTD - Return to clinic in 3 mo with sugar log    2. Obesity class 2 -She gained 5 pounds since last visit - again discussed the need for dietary changes, especially eliminating the sweet tea  Component     Latest Ref Rng & Units 07/03/2018  Glucose     65 - 99 mg/dL 94  BUN     7 - 25 mg/dL 23  Creatinine     0.50 - 0.99 mg/dL 0.71  GFR, Est Non African American     > OR = 60 mL/min/1.58m 88  GFR, Est African American     > OR = 60 mL/min/1.743m101  BUN/Creatinine Ratio     6 - 22 (calc) NOT APPLICABLE  Sodium     13517 146 mmol/L 139  Potassium     3.5 - 5.3 mmol/L 4.1  Chloride     98 - 110 mmol/L 105  CO2     20 - 32 mmol/L 26  Calcium     8.6 - 10.4 mg/dL 9.3  Total Protein     6.1 - 8.1 g/dL 6.7  Albumin MSPROF     3.6 - 5.1  g/dL 4.0  Globulin     1.9 - 3.7 g/dL (calc) 2.7  AG Ratio     1.0 - 2.5 (calc) 1.5  Total Bilirubin     0.2 - 1.2 mg/dL 0.3  Alkaline phosphatase (APISO)     33 - 130 U/L 96  AST     10 - 35 U/L 14  ALT     6 - 29 U/L  15  Cholesterol     0 - 200 mg/dL 200  Triglycerides     0.0 - 149.0 mg/dL 84.0  HDL Cholesterol     >39.00 mg/dL 62.80  VLDL     0.0 - 40.0 mg/dL 16.8  LDL (calc)     0 - 99 mg/dL 120 (H)  Total CHOL/HDL Ratio      3  NonHDL      137.09  Microalb, Ur     0.0 - 1.9 mg/dL <0.7  Creatinine,U     mg/dL 89.6  MICROALB/CREAT RATIO     0.0 - 30.0 mg/g 0.8   Labs are normal with the exception of a high LDL, which is, however, compensated by a high HDL.  Philemon Kingdom, MD PhD [facility] Endocrinology

## 2018-07-11 ENCOUNTER — Other Ambulatory Visit: Payer: Self-pay | Admitting: Internal Medicine

## 2018-09-01 ENCOUNTER — Inpatient Hospital Stay: Payer: Medicare Other | Attending: Gynecologic Oncology | Admitting: Gynecologic Oncology

## 2018-09-01 ENCOUNTER — Encounter: Payer: Self-pay | Admitting: Gynecologic Oncology

## 2018-09-01 VITALS — BP 131/57 | HR 72 | Temp 98.4°F | Resp 20 | Ht 64.0 in | Wt 206.5 lb

## 2018-09-01 DIAGNOSIS — B372 Candidiasis of skin and nail: Secondary | ICD-10-CM | POA: Insufficient documentation

## 2018-09-01 DIAGNOSIS — Z90722 Acquired absence of ovaries, bilateral: Secondary | ICD-10-CM | POA: Diagnosis not present

## 2018-09-01 DIAGNOSIS — Z90710 Acquired absence of both cervix and uterus: Secondary | ICD-10-CM

## 2018-09-01 DIAGNOSIS — C541 Malignant neoplasm of endometrium: Secondary | ICD-10-CM | POA: Diagnosis not present

## 2018-09-01 DIAGNOSIS — B373 Candidiasis of vulva and vagina: Secondary | ICD-10-CM

## 2018-09-01 NOTE — Patient Instructions (Signed)
Please notify Dr Denman George at phone number [phone] if you notice vaginal bleeding, new pelvic or abdominal pains, bloating, feeling full easy, or a change in bladder or bowel function.   Please return to see Dr Matthew Saras in March, 2020 and Dr Denman George in September, 2020. If you contact her office after April, 2020 an appointment can be scheduled.

## 2018-09-01 NOTE — Progress Notes (Signed)
Consult Note: Gyn-Onc  Consult was requested by Dr. Matthew Saras for the evaluation of Angela Mays 66 y.o. female  CC:  Chief Complaint  Patient presents with  . Endometrial carcinoma [facility])    Assessment/Plan:  [name]  is a 67 y.o.  year old with stage IA grade 1 endometrioid endometrial cancer, s/p staging surgery January, 2018, with low risk features and loss of nuclear expression of MMR genes MLH1 and PMS2. 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  Pathology revealed low risk factors for recurrence, therefore no adjuvant therapy is recommended according to NCCN guidelines.  I discussed risk for recurrence and typical symptoms encouraged her to notify us of these should they develop between visits.  I recommend she continue to have follow-up every 6 months for 5 years in accordance with NCCN guidelines. Those visits should include symptom assessment, physical exam and pelvic examination. Pap smears are not indicated or recommended in the routine surveillance of endometrial cancer.  She will follow-up with Dr Matthew Saras in March and myself in September. We will then continue visits q 6 months until January, 2023.   For her vulvar and abdominal canididiasis I am prescribing nystatin powder.   HPI: Angela Mays is a very pleasant 67 year old woman who is seen in consultation at the request of Dr Matthew Saras for grade 1 endometrial cancer.  The patient reports postmenopausal bleeding that began in late September 2017. It was very light. She saw Dr Matthew Saras and on 09/22/16 she had a sonohysterogram which showed a uterus measuring 7.5x4.2x5.9cm with a 62m endometrial stripe. She was taken to the OR on 11/02/16 and a hysteroscopy D&C was performed with myosure resection of the the polyp. It showed extensive FIGO grade 1 endometrial cancer.  The patient reports having a sister with breast cancer in there 740'sand a paternal grandfather with colon  cancer.  She has had only a prior tubal ligation.  She is morbidly obese. Her BMI is 40kg/m2.  She has poorly controlled DM secondary to admitted noncompliance with meds that give her diarrhea.   Interval Hx:  On 01/04/17 she underwent robotic assisted total hysterectomy, BSO and SLN biopsy for her endometrial cancer. Pathology showed a 5cm tumor with 0.6 of 2.6 (25%) myometrial invasion, no LVSI, grade 1 tumor and no cervical or adnexal involvement. The nodes were negative. IHC testing of the tumor revealed abnormalities in mismatch repair genes MLH1 and PMS2.  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 It was not felt to be associated with an increased risk for hereditary cancer syndromes.   She has no symptoms concerning for recurrence. She is on Jardiance for diabetes and has itch in the abdominal pannus fold and labia majora.   Current Meds:  Outpatient Encounter Medications as of 09/01/2018  Medication Sig  . glimepiride (AMARYL) 2 MG tablet Take 1 tablet (2 mg total) by mouth daily before supper.  . metFORMIN (GLUCOPHAGE-XR) 500 MG 24 hr tablet TAKE 2 TABLETS TWICE A DAY  . empagliflozin (JARDIANCE) 25 MG TABS tablet Take 25 mg by mouth daily. (Patient taking differently: Take 25 mg by mouth every other day. Patient states she is now taking this medication every other day because she started having severe itching from medication.)  . glimepiride (AMARYL) 2 MG tablet TAKE 1 TABLET DAILY BEFORE SUPPER   Facility-Administered Encounter Medications as of 09/01/2018  Medication  . 0.9 %  sodium chloride infusion    Allergy:  Allergies  Allergen Reactions  . Flagyl [Metronidazole] Rash    Social Hx:   Social History   Socioeconomic History  . Marital status: Widowed    Spouse name: Not on file  . Number of children: Not on file  . Years of education: Not on file  . Highest education level: Not on file  Occupational History  . Not on file  Social  Needs  . Financial resource strain: Not on file  . Food insecurity:    Worry: Not on file    Inability: Not on file  . Transportation needs:    Medical: Not on file    Non-medical: Not on file  Tobacco Use  . Smoking status: Never Smoker  . Smokeless tobacco: Never Used  Substance and Sexual Activity  . Alcohol use: No  . Drug use: No  . Sexual activity: Not on file  Lifestyle  . Physical activity:    Days per week: Not on file    Minutes per session: Not on file  . Stress: Not on file  Relationships  . Social connections:    Talks on phone: Not on file    Gets together: Not on file    Attends religious service: Not on file    Active member of club or organization: Not on file    Attends meetings of clubs or organizations: Not on file    Relationship status: Not on file  . Intimate partner violence:    Fear of current or ex partner: Not on file    Emotionally abused: Not on file    Physically abused: Not on file    Forced sexual activity: Not on file  Other Topics Concern  . Not on file  Social History Narrative  . Not on file    Past Surgical Hx:  Past Surgical History:  Procedure Laterality Date  . COLONOSCOPY  done at AGE 81   w/Dr.Mann=normal exam  . DILATATION & CURETTAGE/HYSTEROSCOPY WITH MYOSURE N/A 11/02/2016   Procedure: DILATATION & CURETTAGE/HYSTEROSCOPY WITH MYOSURE;  Surgeon: Molli Posey, MD;  Location: [facility];  Service: Gynecology;  Laterality: N/A;  . DILATION AND CURETTAGE OF UTERUS    . LYMPH NODE BIOPSY N/A 01/04/2017   Procedure: SENTINEL LYMPH NODE BIOPSY;  Surgeon: Everitt Amber, MD;  Location: [facility];  Service: Gynecology;  Laterality: N/A;  . ROBOTIC ASSISTED TOTAL HYSTERECTOMY WITH BILATERAL SALPINGO OOPHERECTOMY Bilateral 01/04/2017   Procedure: XI ROBOTIC ASSISTED TOTAL HYSTERECTOMY WITH BILATERAL SALPINGO OOPHORECTOMY;  Surgeon: Everitt Amber, MD;  Location: [facility];  Service: Gynecology;  Laterality: Bilateral;  . TUBAL LIGATION  YRS AGO     Past Medical Hx:  Past Medical History:  Diagnosis Date  . Blood transfusion without reported diagnosis at birth   . Cancer ([place]) 12/2016   ENDOMETRIAL CANCER  . Diabetes mellitus without complication ([place])    TYPE 2  . Genetic testing 04/07/2017   Angela Mays underwent genetic counseling and testing for hereditary cancer syndromes on 03/24/2017. There were no pathogenic mutations identified in any of the 46 genes analyzed by [facility]'s 46-gene Common Hereditary Cancers Panel. Genes analyzed include: APC, ATM, AXIN2, BARD1, BMPR1A, BRCA1, BRCA2, BRIP1, CDH1, CDKN2A, CHEK2, CTNNA1, DICER1, EPCAM, GREM1, HOXB13, KIT, MEN1, MLH1, MSH2, MSH3, MSH6  . Leg cramps    and feet OCC  . Pneumonia YRS AGO  . PONV (postoperative nausea and vomiting)    NAUSEA  . Skin rash 10/21/2016   CONTACT [facility]ER[facility]ITIS [facility]    Past Gynecological History:  SVD x 2 No LMP recorded. Patient is postmenopausal.  Family Hx:  Family History  Problem Relation Age of Onset  . Heart Problems Father   . Cervical cancer Sister 29       full sister  . High blood pressure Son   . High blood pressure Son   . Breast cancer Sister 58       maternal half sister  . Melanoma Sister   . Bone cancer Mother 43       d.69  . Stomach cancer Maternal Grandfather 60       d.60s  . Colon cancer Paternal Grandmother 57       d.84  . Kidney cancer Paternal Grandfather 38       d.58    Review of Systems:  Constitutional  Feels well,    ENT Normal appearing ears and nares bilaterally Skin/Breast  + pruritis labia majora and abdominal pannus  Cardiovascular  No chest pain, shortness of breath, or edema  Pulmonary  No cough or wheeze.  Gastro Intestinal  No nausea, vomitting, or diarrhoea. No bright red blood per rectum, no abdominal pain, change in bowel movement, or constipation.  Genito Urinary  No frequency, urgency, dysuria, no bleeding Musculo Skeletal  No myalgia, arthralgia, joint swelling or pain  Neurologic   No weakness, numbness, change in gait,  Psychology  No depression, anxiety, insomnia.   Vitals:  Blood pressure (!) 131/57, pulse 72, temperature 98.4 F (36.9 C), temperature source Oral, resp. rate 20, height '5\' 4"'  (1.626 m), weight 206 lb 8 oz (93.7 kg), SpO2 99 %.  Physical Exam: WD in NAD Neck  Supple NROM, without any enlargements.  Lymph Node Survey No cervical supraclavicular or inguinal adenopathy Cardiovascular  Pulse normal rate, regularity and rhythm. S1 and S2 normal.  Lungs  Clear to auscultation bilateraly, without wheezes/crackles/rhonchi. Good air movement.  Skin  + candidiasis rash in abdominal pannus and labia majora Psychiatry  Alert and oriented to person, place, and time  Abdomen  Normoactive bowel sounds, abdomen soft, non-tender and obese without evidence of hernia. Incisions well healed no palpable masses. Back No CVA tenderness Genito Urinary : no lesions or masses in vagina, and non appreciated on bimanual exam.  Rectal  deferred Extremities  No bilateral cyanosis, clubbing or edema.  Thereasa Solo, MD  09/01/2018, 4:08 PM

## 2018-09-23 DIAGNOSIS — Z23 Encounter for immunization: Secondary | ICD-10-CM | POA: Diagnosis not present

## 2018-10-19 ENCOUNTER — Ambulatory Visit: Payer: Medicare Other | Admitting: Internal Medicine

## 2018-11-13 ENCOUNTER — Ambulatory Visit (INDEPENDENT_AMBULATORY_CARE_PROVIDER_SITE_OTHER): Payer: Medicare Other | Admitting: Internal Medicine

## 2018-11-13 ENCOUNTER — Encounter: Payer: Self-pay | Admitting: Internal Medicine

## 2018-11-13 VITALS — BP 122/90 | HR 72 | Ht 64.0 in | Wt 203.0 lb

## 2018-11-13 DIAGNOSIS — E1165 Type 2 diabetes mellitus with hyperglycemia: Secondary | ICD-10-CM | POA: Diagnosis not present

## 2018-11-13 DIAGNOSIS — E785 Hyperlipidemia, unspecified: Secondary | ICD-10-CM

## 2018-11-13 DIAGNOSIS — Z6835 Body mass index (BMI) 35.0-35.9, adult: Secondary | ICD-10-CM | POA: Diagnosis not present

## 2018-11-13 LAB — POCT GLYCOSYLATED HEMOGLOBIN (HGB A1C): HEMOGLOBIN A1C: 6.2 % — AB (ref 4.0–5.6)

## 2018-11-13 MED ORDER — BLOOD GLUCOSE MONITOR KIT: PACK | 0 refills | Status: AC

## 2018-11-13 NOTE — Addendum Note (Signed)
Addended by: Cardell Peach I on: 11/13/2018 04:43 PM   Modules accepted: Orders

## 2018-11-13 NOTE — Patient Instructions (Addendum)
Please continue: - Metformin XR 1000 mg 2x a day with meals - Jardiance 25 mg before first meal of the day  - Glimepiride 2 mg before dinner only   STOP sweet drinks.  Please return in 4 months with your sugar log.

## 2018-11-13 NOTE — Progress Notes (Signed)
Patient ID: Angela Mays, female   DOB: 1951/06/14, 67 y.o.   MRN: [id number]   HPI: Angela Mays is a 67 y.o.-year-old female, returning for f/u for DM2, dx in 09/2014, non-insulin-dependent, uncontrolled, without long term complications. Last visit 4 months ago.  She refused sweet drinks since last visit but did not stop them completely.  Last hemoglobin A1c was: Lab Results  Component Value Date   HGBA1C 6.7 (A) 07/03/2018   HGBA1C 6.4 03/02/2018   HGBA1C 6.1 10/04/2017   Pt is on: - Metformin XR 1000 mg 2x a day with meals - Jardiance 25 mg before first meal of the day  - Glimepiride 2 mg before dinner only   Pt is checking sugars once a day- forget log: - am:88-121, 226 >> 104-143, 159, 170 >> 110-143 >> 118-120 - 2h after b'fast: n/c >> 161-206, 251 >> n/c - before lunch: 141-206, 254 >> n/c >> 87 >> 75-129 >> n/c >> ? - 2h after lunch: n/c >> 115, 173 >> n/c >> 127 >> n/c  - before dinner: n/c >> 59-92 >> n/c >> 78-187 >> ? - 2h after dinner: 293 (out of town) >> n/c >> 194-262 >> 68, 180s, 200 - bedtime: n/c - nighttime: n/c >> 95-205 >> 130 >> n/c Highest: 200  Glucometer: One Touch ultra mini  Pt's meals are: - Breakfast: oatmeal or PB sandwich or bacon and eggs or cereals or skips during the week - Lunch: sandwich or spaghetti or fast foods - Dinner: largest - meat + veggies + starch - Snacks: 2-3: less chocolate, potato chips,  M&M Still drinking sweet tea - now only ocassionally.  -No CKD, last BUN/creatinine:  Lab Results  Component Value Date   BUN 23 07/03/2018   BUN 19 03/29/2017   CREATININE 0.71 07/03/2018   CREATININE 0.87 03/29/2017  03/29/2017: ACR undet. Not on an ACE inhibitor/ARB  -+ HL; last set of lipids: Lab Results  Component Value Date   CHOL 200 07/03/2018   HDL 62.80 07/03/2018   LDLCALC 120 (H) 07/03/2018   TRIG 84.0 07/03/2018   CHOLHDL 3 07/03/2018  03/29/2017: 193/56/63/119 Not on a statin  - last eye exam was in  11/2017: No DR, + cataract  -No numbness and tingling in her feet.  She has a history of endometrial cancer and had hysterectomy.  ROS: Constitutional: no weight gain/no weight loss, no fatigue, no subjective hyperthermia, no subjective hypothermia Eyes: no blurry vision, no xerophthalmia ENT: no sore throat, no nodules palpated in neck, no dysphagia, no odynophagia, no hoarseness Cardiovascular: no CP/no SOB/no palpitations/no leg swelling Respiratory: no cough/no SOB/no wheezing Gastrointestinal: no N/no V/no D/no C/no acid reflux Musculoskeletal: no muscle aches/no joint aches Skin: no rashes, no hair loss Neurological: no tremors/no numbness/no tingling/no dizziness  I reviewed pt's medications, allergies, PMH, social hx, family hx, and changes were documented in the history of present illness. Otherwise, unchanged from my initial visit note.  Past Medical History:  Diagnosis Date  . Blood transfusion without reported diagnosis at birth   . Cancer ([place]) 12/2016   ENDOMETRIAL CANCER  . Diabetes mellitus without complication ([place])    TYPE 2  . Genetic testing 04/07/2017   Ms. Levingston underwent genetic counseling and testing for hereditary cancer syndromes on 03/24/2017. There were no pathogenic mutations identified in any of the 46 genes analyzed by [facility]'s 46-gene Common Hereditary Cancers Panel. Genes analyzed include: APC, ATM, AXIN2, BARD1, BMPR1A, BRCA1, BRCA2, BRIP1, CDH1, CDKN2A, CHEK2, CTNNA1,  DICER1, EPCAM, GREM1, HOXB13, KIT, MEN1, MLH1, MSH2, MSH3, MSH6  . Leg cramps    and feet OCC  . Pneumonia YRS AGO  . PONV (postoperative nausea and vomiting)    NAUSEA  . Skin rash 10/21/2016   CONTACT DERMITITIS OCC   Past Surgical History:  Procedure Laterality Date  . COLONOSCOPY  done at AGE 47   w/Dr.Mann=normal exam  . DILATATION & CURETTAGE/HYSTEROSCOPY WITH MYOSURE N/A 11/02/2016   Procedure: DILATATION & CURETTAGE/HYSTEROSCOPY WITH MYOSURE;  Surgeon: Molli Posey,  MD;  Location: [facility];  Service: Gynecology;  Laterality: N/A;  . DILATION AND CURETTAGE OF UTERUS    . LYMPH NODE BIOPSY N/A 01/04/2017   Procedure: SENTINEL LYMPH NODE BIOPSY;  Surgeon: Everitt Amber, MD;  Location: [facility];  Service: Gynecology;  Laterality: N/A;  . ROBOTIC ASSISTED TOTAL HYSTERECTOMY WITH BILATERAL SALPINGO OOPHERECTOMY Bilateral 01/04/2017   Procedure: XI ROBOTIC ASSISTED TOTAL HYSTERECTOMY WITH BILATERAL SALPINGO OOPHORECTOMY;  Surgeon: Everitt Amber, MD;  Location: [facility];  Service: Gynecology;  Laterality: Bilateral;  . TUBAL LIGATION  YRS AGO   Social History   Social History  . Marital status: Single    Spouse name: N/A  . Number of children: 2   Occupational History  . Glass blower/designer    Social History Main Topics  . Smoking status: Never Smoker  . Smokeless tobacco: Never Used  . Alcohol use No  . Drug use: No   Current Outpatient Medications on File Prior to Visit  Medication Sig Dispense Refill  . empagliflozin (JARDIANCE) 25 MG TABS tablet Take 25 mg by mouth daily. (Patient taking differently: Take 25 mg by mouth every other day. Patient states she is now taking this medication every other day because she started having severe itching from medication.) 90 tablet 3  . glimepiride (AMARYL) 2 MG tablet TAKE 1 TABLET DAILY BEFORE SUPPER 90 tablet 1  . metFORMIN (GLUCOPHAGE-XR) 500 MG 24 hr tablet TAKE 2 TABLETS TWICE A DAY 360 tablet 3   Current Facility-Administered Medications on File Prior to Visit  Medication Dose Route Frequency Provider Last Rate Last Dose  . 0.9 %  sodium chloride infusion  500 mL Intravenous Once Nandigam, Venia Minks, MD       Allergies  Allergen Reactions  . Flagyl [Metronidazole] Rash   Family History  Problem Relation Age of Onset  . Heart Problems Father   . Cervical cancer Sister 3       full sister  . High blood pressure Son   . High blood pressure Son   . Breast cancer Sister 67       maternal half sister  . Melanoma  Sister   . Bone cancer Mother 18       d.69  . Stomach cancer Maternal Grandfather 60       d.60s  . Colon cancer Paternal Grandmother 24       d.84  . Kidney cancer Paternal Grandfather 54       d.58   PE: BP 122/90   Pulse 72   Ht '5\' 4"'  (1.626 m) Comment: measured  Wt 203 lb (92.1 kg)   SpO2 95%   BMI 34.84 kg/m  Wt Readings from Last 3 Encounters:  11/13/18 203 lb (92.1 kg)  09/01/18 206 lb 8 oz (93.7 kg)  07/03/18 206 lb 3.2 oz (93.5 kg)   Constitutional: overweight, in NAD Eyes: PERRLA, EOMI, no exophthalmos ENT: moist mucous membranes, no thyromegaly, no cervical lymphadenopathy Cardiovascular: RRR, No MRG  Respiratory: CTA B Gastrointestinal: abdomen soft, NT, ND, BS+ Musculoskeletal: no deformities, strength intact in all 4 Skin: moist, warm, no rashes Neurological: no tremor with outstretched hands, DTR normal in all 4  ASSESSMENT: 1. DM2, non-insulin-dependent, uncontrolled, without long term complications, but with hyperglycemia  2. Obesity class 2 BMI Classification:  < 18.5 underweight   18.5-24.9 normal weight   25.0-29.9 overweight   30.0-34.9 class I obesity   35.0-39.9 class II obesity   ? 40.0 class III obesity   3. HL  PLAN:  1. Patient with history of uncontrolled diabetes with significant improvement of her diabetes after adding Jardiance, but then with continuing improvement due to dietary changes.  At last visit, we did not change her regimen but we discussed about stopping sweet tea and also try not to forget glimepiride before dinner. -Of note, she had hypoglycemia in the 50s from higher doses of glimepiride.  Since last visit she had one lower blood sugar at 68 after she took 2x 2 mg tablets of glimepiride before dinner.  We discussed about sticking with just a 2 mg tablet. -At this visit, sugars have improved further, and she only has an occasional higher blood sugar after dinner.  She still continues to drink either sweet tea or juice  and I strongly advised her to stop.  Otherwise, she does not need any changes in her regimen. - I suggested to:  Patient Instructions  Please continue: - Metformin XR 1000 mg 2x a day with meals - Jardiance 25 mg before first meal of the day  - Glimepiride 2 mg before dinner only   Please return in 3 months with your sugar log.   - today, HbA1c is 6.2% (improved) - continue checking sugars at different times of the day - check 1x a day, rotating checks - advised for yearly eye exams >> she is UTD - UTD with flu shot - Return to clinic in 4 mo with sugar log     2. Obesity class 2 -She lost 3 pounds since last visit.  She will need to lose more. -At last visit, we discussed about eliminating sweet tea >> now still drinks some sweet tea and  Juice >> strongly advised to stop. -We will continue Jardiance which should also help with weight loss  3. HL - Reviewed latest lipid panel from 06/2018: LDL elevated Lab Results  Component Value Date   CHOL 200 07/03/2018   HDL 62.80 07/03/2018   LDLCALC 120 (H) 07/03/2018   TRIG 84.0 07/03/2018   CHOLHDL 3 07/03/2018  -She is not on a statin -her lipid fractions should improve with improvement in her diet -However, we also discussed about starting a statin >> she would not like to add another pill.  Philemon Kingdom, MD PhD [facility]

## 2018-12-22 ENCOUNTER — Other Ambulatory Visit: Payer: Self-pay | Admitting: Internal Medicine

## 2019-01-29 DIAGNOSIS — E119 Type 2 diabetes mellitus without complications: Secondary | ICD-10-CM | POA: Diagnosis not present

## 2019-01-29 DIAGNOSIS — H2513 Age-related nuclear cataract, bilateral: Secondary | ICD-10-CM | POA: Diagnosis not present

## 2019-03-15 ENCOUNTER — Ambulatory Visit: Payer: Medicare Other | Admitting: Internal Medicine

## 2019-06-04 DIAGNOSIS — N958 Other specified menopausal and perimenopausal disorders: Secondary | ICD-10-CM | POA: Diagnosis not present

## 2019-06-04 DIAGNOSIS — Z6834 Body mass index (BMI) 34.0-34.9, adult: Secondary | ICD-10-CM | POA: Diagnosis not present

## 2019-06-04 DIAGNOSIS — N760 Acute vaginitis: Secondary | ICD-10-CM | POA: Diagnosis not present

## 2019-06-04 DIAGNOSIS — Z01419 Encounter for gynecological examination (general) (routine) without abnormal findings: Secondary | ICD-10-CM | POA: Diagnosis not present

## 2019-06-04 DIAGNOSIS — Z1231 Encounter for screening mammogram for malignant neoplasm of breast: Secondary | ICD-10-CM | POA: Diagnosis not present

## 2019-06-04 LAB — HM DEXA SCAN: HM Dexa Scan: NORMAL

## 2019-06-07 ENCOUNTER — Other Ambulatory Visit: Payer: Self-pay | Admitting: Internal Medicine

## 2019-06-27 ENCOUNTER — Other Ambulatory Visit: Payer: Self-pay

## 2019-06-29 ENCOUNTER — Other Ambulatory Visit: Payer: Self-pay

## 2019-06-29 ENCOUNTER — Encounter: Payer: Self-pay | Admitting: Internal Medicine

## 2019-06-29 ENCOUNTER — Ambulatory Visit (INDEPENDENT_AMBULATORY_CARE_PROVIDER_SITE_OTHER): Payer: Medicare Other | Admitting: Internal Medicine

## 2019-06-29 VITALS — BP 140/70 | HR 70 | Ht 64.0 in | Wt 195.0 lb

## 2019-06-29 DIAGNOSIS — E1165 Type 2 diabetes mellitus with hyperglycemia: Secondary | ICD-10-CM

## 2019-06-29 DIAGNOSIS — E785 Hyperlipidemia, unspecified: Secondary | ICD-10-CM | POA: Diagnosis not present

## 2019-06-29 DIAGNOSIS — Z6835 Body mass index (BMI) 35.0-35.9, adult: Secondary | ICD-10-CM

## 2019-06-29 LAB — POCT GLYCOSYLATED HEMOGLOBIN (HGB A1C): Hemoglobin A1C: 7.5 % — AB (ref 4.0–5.6)

## 2019-06-29 NOTE — Patient Instructions (Addendum)
Please increase: - Metformin XR to 1000 mg 2x a day with   Please continue: - Jardiance 25 mg before first meal of the day  - Glimepiride 2 mg before dinner only   STOP juice!  Please return in 4 months with your sugar log.

## 2019-06-29 NOTE — Progress Notes (Signed)
Patient ID: Angela Mays, female   DOB: 11-Jan-1951, 68 y.o.   MRN: [id number]   HPI: Angela Mays is a 68 y.o.-year-old female, returning for f/u for DM2, dx in 09/2014, non-insulin-dependent, uncontrolled, without long term complications. Last visit 7 months ago.  She restarted snacking after dinner.  Also, she decreased the dose of metformin due to the recall of the metformin ER tablets.  She also came off Jardiance for 1 month due to yeast infections.  These have now resolved and she restarted Jardiance.  Last hemoglobin A1c was: Lab Results  Component Value Date   HGBA1C 6.2 (A) 11/13/2018   HGBA1C 6.7 (A) 07/03/2018   HGBA1C 6.4 03/02/2018   Pt is on: - Metformin XR 1000 mg 2x a day with meals >> 500 mg 2x a day - Jardiance 25 mg before first meal of the day >> was off for 1 mo in 04/2019 b/c yeast inf - took Diflucan and also now on Terconazole >> now back on Jardiance with no  - Glimepiride 2 mg before dinner only   Pt is checking sugars once a day: - am 104-143, 159, 170 >> 110-143 >> 118-120 >> 120s - 2h after b'fast: n/c >> 161-206, 251 >> n/c - before lunch: 87 >> 75-129 >> n/c >> ? - 2h after lunch: 115, 173 >> n/c >> 127 >> n/c  - before dinner: 59-92 >> n/c >> 78-187 >> 136-139 - 2h after dinner: 194-262 >> 68, 180s, 200 >>  - bedtime: n/c - nighttime: n/c >> 95-205 >> 130 >> n/c Highest: 200 >> 200s x1 off Jardiance.  Glucometer: One Touch ultra mini  Pt's meals are: - Breakfast: oatmeal or PB sandwich or bacon and eggs or cereals or skips during the week - Lunch: sandwich or spaghetti or fast foods - Dinner: largest - meat + veggies + starch - Snacks: 2-3: less chocolate, potato chips,  M&M Stopped Sweet tea but has juice-decreased.  -No CKD, last BUN/creatinine:  Lab Results  Component Value Date   BUN 23 07/03/2018   BUN 19 03/29/2017   CREATININE 0.71 07/03/2018   CREATININE 0.87 03/29/2017  03/29/2017: ACR undet. Not on an ACE  inhibitor/ARB.  -+ HL; last set of lipids: Lab Results  Component Value Date   CHOL 200 07/03/2018   HDL 62.80 07/03/2018   LDLCALC 120 (H) 07/03/2018   TRIG 84.0 07/03/2018   CHOLHDL 3 07/03/2018  03/29/2017: 193/56/63/119 Not on a statin.  - last eye exam was in 01/2019: No DR, + cataract  - no numbness and tingling in her feet.  She has a history of endometrial cancer and had hysterectomy.  ROS: Constitutional: no weight gain/no weight loss, no fatigue, no subjective hyperthermia, no subjective hypothermia Eyes: no blurry vision, no xerophthalmia ENT: no sore throat, no nodules palpated in neck, no dysphagia, no odynophagia, no hoarseness Cardiovascular: no CP/no SOB/no palpitations/no leg swelling Respiratory: no cough/no SOB/no wheezing Gastrointestinal: no N/no V/no D/no C/no acid reflux Musculoskeletal: no muscle aches/no joint aches Skin: no rashes, no hair loss Neurological: no tremors/no numbness/no tingling/no dizziness  I reviewed pt's medications, allergies, PMH, social hx, family hx, and changes were documented in the history of present illness. Otherwise, unchanged from my initial visit note.  Past Medical History:  Diagnosis Date  . Blood transfusion without reported diagnosis at birth   . Cancer ([place]) 12/2016   ENDOMETRIAL CANCER  . Diabetes mellitus without complication ([place])    TYPE 2  . Genetic  testing 04/07/2017   Ms. Motz underwent genetic counseling and testing for hereditary cancer syndromes on 03/24/2017. There were no pathogenic mutations identified in any of the 46 genes analyzed by [facility]'s 46-gene Common Hereditary Cancers Panel. Genes analyzed include: APC, ATM, AXIN2, BARD1, BMPR1A, BRCA1, BRCA2, BRIP1, CDH1, CDKN2A, CHEK2, CTNNA1, DICER1, EPCAM, GREM1, HOXB13, KIT, MEN1, MLH1, MSH2, MSH3, MSH6  . Leg cramps    and feet OCC  . Pneumonia YRS AGO  . PONV (postoperative nausea and vomiting)    NAUSEA  . Skin rash 10/21/2016   CONTACT  DERMITITIS OCC   Past Surgical History:  Procedure Laterality Date  . COLONOSCOPY  done at AGE 86   w/Dr.Mann=normal exam  . DILATATION & CURETTAGE/HYSTEROSCOPY WITH MYOSURE N/A 11/02/2016   Procedure: DILATATION & CURETTAGE/HYSTEROSCOPY WITH MYOSURE;  Surgeon: Molli Posey, MD;  Location: [facility];  Service: Gynecology;  Laterality: N/A;  . DILATION AND CURETTAGE OF UTERUS    . LYMPH NODE BIOPSY N/A 01/04/2017   Procedure: SENTINEL LYMPH NODE BIOPSY;  Surgeon: Everitt Amber, MD;  Location: [facility];  Service: Gynecology;  Laterality: N/A;  . ROBOTIC ASSISTED TOTAL HYSTERECTOMY WITH BILATERAL SALPINGO OOPHERECTOMY Bilateral 01/04/2017   Procedure: XI ROBOTIC ASSISTED TOTAL HYSTERECTOMY WITH BILATERAL SALPINGO OOPHORECTOMY;  Surgeon: Everitt Amber, MD;  Location: [facility];  Service: Gynecology;  Laterality: Bilateral;  . TUBAL LIGATION  YRS AGO   Social History   Social History  . Marital status: Single    Spouse name: N/A  . Number of children: 2   Occupational History  . Glass blower/designer    Social History Main Topics  . Smoking status: Never Smoker  . Smokeless tobacco: Never Used  . Alcohol use No  . Drug use: No   Current Outpatient Medications on File Prior to Visit  Medication Sig Dispense Refill  . blood glucose meter kit and supplies KIT Dispense based on patient and insurance preference. Use 2x times daily as directed. (FOR ICD-9 250.00, 250.01). 1 each 0  . glimepiride (AMARYL) 2 MG tablet TAKE 1 TABLET DAILY BEFORE SUPPER 90 tablet 4  . JARDIANCE 25 MG TABS tablet TAKE 1 TABLET DAILY 90 tablet 4  . metFORMIN (GLUCOPHAGE-XR) 500 MG 24 hr tablet TAKE 2 TABLETS TWICE A DAY 360 tablet 3   Current Facility-Administered Medications on File Prior to Visit  Medication Dose Route Frequency Provider Last Rate Last Dose  . 0.9 %  sodium chloride infusion  500 mL Intravenous Once Nandigam, Venia Minks, MD       Allergies  Allergen Reactions  . Flagyl [Metronidazole] Rash   Family History   Problem Relation Age of Onset  . Heart Problems Father   . Cervical cancer Sister 64       full sister  . High blood pressure Son   . High blood pressure Son   . Breast cancer Sister 79       maternal half sister  . Melanoma Sister   . Bone cancer Mother 24       d.69  . Stomach cancer Maternal Grandfather 60       d.60s  . Colon cancer Paternal Grandmother 25       d.84  . Kidney cancer Paternal Grandfather 60       d.58   PE: BP 140/70   Pulse 70   Ht '5\' 4"'  (1.626 m)   Wt 195 lb (88.5 kg)   SpO2 97%   BMI 33.47 kg/m  Wt Readings from Last 3  Encounters:  06/29/19 195 lb (88.5 kg)  11/13/18 203 lb (92.1 kg)  09/01/18 206 lb 8 oz (93.7 kg)   Constitutional: overweight, in NAD Eyes: PERRLA, EOMI, no exophthalmos ENT: moist mucous membranes, no thyromegaly, no cervical lymphadenopathy Cardiovascular: RRR, No MRG Respiratory: CTA B Gastrointestinal: abdomen soft, NT, ND, BS+ Musculoskeletal: no deformities, strength intact in all 4 Skin: moist, warm, no rashes Neurological: no tremor with outstretched hands, DTR normal in all 4  ASSESSMENT: 1. DM2, non-insulin-dependent, uncontrolled, without long term complications, but with hyperglycemia  2. Obesity class 2 BMI Classification:  < 18.5 underweight   18.5-24.9 normal weight   25.0-29.9 overweight   30.0-34.9 class I obesity   35.0-39.9 class II obesity   ? 40.0 class III obesity   3. HL  PLAN:  1. Patient with history of uncontrolled diabetes with significant improvement in her diabetes control after adding Jardiance, and then continuing improvement due to dietary changes.  At last visit, she was still drinking sweet tea and juice and I strongly advised her to stop.  We did not change her regimen otherwise but I did advise her not to take more than 2 mg of glimepiride before dinner since she did have low blood sugars with higher doses in the past. -At this visit, we checked her HbA1c: 7.5%  (higher) -Sugars were higher when she was off Jardiance due to yeast infection.  However, she was treated with Diflucan and now has terconazole cream and even though she restarted the Jardiance, she has no more yeast infections.  Recently she also decrease the dose of ER due to FDA recalls.  However, the pharmacy that she is using did not have the recalled lot so I advised her to restart metformin at the higher dose of 1000 mg twice a day.  I also advised her to stop juice and start snacking after dinner (ice cream), which is definitely increasing her blood sugars.  No other changes are needed for now. - I suggested to:  Patient Instructions  Please increase: - Metformin XR to 1000 mg 2x a day with   Please continue: - Jardiance 25 mg before first meal of the day  - Glimepiride 2 mg before dinner only   STOP juice!  Please return in 4 months with your sugar log.   - advised to check sugars at different times of the day - 1x a day, rotating check times - advised for yearly eye exams >> she is UTD - return to clinic in 4 months     2. Obesity class 2 -She lost 3 pounds before last visit, 8 more lbs since then -At last visit we discussed about eliminating sweet tea and juice -We will continue Jardiance which should also help with weight loss  3. HL - Reviewed latest lipid panel from a year ago: LDL above target, the rest of the fractions at goal Lab Results  Component Value Date   CHOL 200 07/03/2018   HDL 62.80 07/03/2018   LDLCALC 120 (H) 07/03/2018   TRIG 84.0 07/03/2018   CHOLHDL 3 07/03/2018  -She refused adding a statin as she did not want to increase her pill burden  Philemon Kingdom, MD PhD [facility] Endocrinology

## 2019-06-29 NOTE — Addendum Note (Signed)
Addended by: Cardell Peach I on: 06/29/2019 09:56 AM   Modules accepted: Orders

## 2019-07-31 ENCOUNTER — Telehealth: Payer: Self-pay | Admitting: Internal Medicine

## 2019-07-31 DIAGNOSIS — Z7984 Long term (current) use of oral hypoglycemic drugs: Secondary | ICD-10-CM | POA: Diagnosis not present

## 2019-07-31 DIAGNOSIS — E119 Type 2 diabetes mellitus without complications: Secondary | ICD-10-CM | POA: Diagnosis not present

## 2019-07-31 DIAGNOSIS — C541 Malignant neoplasm of endometrium: Secondary | ICD-10-CM | POA: Diagnosis not present

## 2019-07-31 DIAGNOSIS — Z0000 Encounter for general adult medical examination without abnormal findings: Secondary | ICD-10-CM | POA: Diagnosis not present

## 2019-07-31 NOTE — Telephone Encounter (Signed)
Faxed requested information to fax# provided

## 2019-07-31 NOTE — Telephone Encounter (Signed)
Dr. Radene Ou Medical Assistant ph# [phone] called re; patient is there. Dr.Rankins assistant requests labs and July 2020 office notes be faxed to Fax# [phone] asap.

## 2019-08-07 DIAGNOSIS — Z7984 Long term (current) use of oral hypoglycemic drugs: Secondary | ICD-10-CM | POA: Diagnosis not present

## 2019-08-07 DIAGNOSIS — E119 Type 2 diabetes mellitus without complications: Secondary | ICD-10-CM | POA: Diagnosis not present

## 2019-10-17 DIAGNOSIS — E7800 Pure hypercholesterolemia, unspecified: Secondary | ICD-10-CM | POA: Diagnosis not present

## 2019-10-17 DIAGNOSIS — R7309 Other abnormal glucose: Secondary | ICD-10-CM | POA: Diagnosis not present

## 2019-11-07 DIAGNOSIS — E7800 Pure hypercholesterolemia, unspecified: Secondary | ICD-10-CM | POA: Diagnosis not present

## 2019-11-07 DIAGNOSIS — R7309 Other abnormal glucose: Secondary | ICD-10-CM | POA: Diagnosis not present

## 2019-11-19 DIAGNOSIS — E1165 Type 2 diabetes mellitus with hyperglycemia: Secondary | ICD-10-CM | POA: Diagnosis not present

## 2019-11-19 DIAGNOSIS — E119 Type 2 diabetes mellitus without complications: Secondary | ICD-10-CM | POA: Diagnosis not present

## 2019-11-19 DIAGNOSIS — Z7984 Long term (current) use of oral hypoglycemic drugs: Secondary | ICD-10-CM | POA: Diagnosis not present

## 2019-11-19 DIAGNOSIS — E7800 Pure hypercholesterolemia, unspecified: Secondary | ICD-10-CM | POA: Diagnosis not present

## 2019-11-19 DIAGNOSIS — C541 Malignant neoplasm of endometrium: Secondary | ICD-10-CM | POA: Diagnosis not present

## 2019-11-27 ENCOUNTER — Ambulatory Visit: Payer: Medicare Other | Admitting: Internal Medicine

## 2020-01-17 ENCOUNTER — Other Ambulatory Visit: Payer: Self-pay

## 2020-01-21 ENCOUNTER — Ambulatory Visit (INDEPENDENT_AMBULATORY_CARE_PROVIDER_SITE_OTHER): Payer: Medicare Other | Admitting: Internal Medicine

## 2020-01-21 ENCOUNTER — Other Ambulatory Visit: Payer: Self-pay

## 2020-01-21 ENCOUNTER — Encounter: Payer: Self-pay | Admitting: Internal Medicine

## 2020-01-21 VITALS — BP 130/78 | HR 67 | Ht 64.0 in | Wt 189.8 lb

## 2020-01-21 DIAGNOSIS — E1165 Type 2 diabetes mellitus with hyperglycemia: Secondary | ICD-10-CM | POA: Diagnosis not present

## 2020-01-21 DIAGNOSIS — E785 Hyperlipidemia, unspecified: Secondary | ICD-10-CM

## 2020-01-21 DIAGNOSIS — Z6835 Body mass index (BMI) 35.0-35.9, adult: Secondary | ICD-10-CM

## 2020-01-21 LAB — POCT GLYCOSYLATED HEMOGLOBIN (HGB A1C): Hemoglobin A1C: 6.1 % — AB (ref 4.0–5.6)

## 2020-01-21 NOTE — Progress Notes (Signed)
Patient ID: Angela Mays, female   DOB: 1951/07/21, 69 y.o.   MRN: [id number]   This visit occurred during the SARS-CoV-2 public health emergency.  Safety protocols were in place, including screening questions prior to the visit, additional usage of staff PPE, and extensive cleaning of exam room while observing appropriate contact time as indicated for disinfecting solutions.   HPI: Angela Mays is a 69 y.o.-year-old female, returning for f/u for DM2, dx in 09/2014, non-insulin-dependent, uncontrolled, without long term complications. Last visit 7 months ago.  Reviewed HbA1c levels: 11/07/2019: HbA1c: 6.9% Lab Results  Component Value Date   HGBA1C 7.5 (A) 06/29/2019   HGBA1C 6.2 (A) 11/13/2018   HGBA1C 6.7 (A) 07/03/2018   Pt is on: - Metformin XR 1000 mg 2x a day with meals >> 500 >> 1000 >> 500 mg 2x a day - Jardiance 25 mg before first meal of the day (on terconazole) - Glimepiride 2 mg before dinner only   Pt is checking sugars once a day: - am  110-143 >> 118-120 >> 120s >> 134, 138 - 2h after b'fast: n/c >> 161-206, 251 >> n/c >> 147 - before lunch: 87 >> 75-129 >> n/c >> ? >> n/c - 2h after lunch: 115, 173 >> n/c >> 127 >> n/c >> 171 - before dinner:  78-187 >> 136-139 >> 148,176 - 2h after dinner: 194-262 >> 68, 180s, 200 >> 86- 198, 201 - bedtime: n/c - nighttime: n/c >> 95-205 >> 130 >> n/c  Glucometer: One Touch ultra mini  Pt's meals are: - Breakfast: oatmeal or PB sandwich or bacon and eggs or cereals or skips during the week - Lunch: sandwich or spaghetti or fast foods - Dinner: largest - meat + veggies + starch - Snacks: 2-3: less chocolate, potato chips,  M&M Rarely drinks sweet tea and juice.  -No CKD, last BUN/creatinine:  Lab Results  Component Value Date   BUN 23 07/03/2018   BUN 19 03/29/2017   CREATININE 0.71 07/03/2018   CREATININE 0.87 03/29/2017  03/29/2017: ACR undet. Not on an ACE inhibitor/ARB.  -+ HL; last set of lipids: 11/07/2019:  162/58/57/94 Lab Results  Component Value Date   CHOL 200 07/03/2018   HDL 62.80 07/03/2018   LDLCALC 120 (H) 07/03/2018   TRIG 84.0 07/03/2018   CHOLHDL 3 07/03/2018  03/29/2017: 193/56/63/119 She was started on Atorvastatin 10 - started 3 mo before last set of Lipids.  - last eye exam was in 01/2019: No DR, + cataract. On Ocuvite. She does not have ocular insurance.  - no numbness and tingling in her feet.  She has a history of endometrial cancer and had hysterectomy.  She was laid off from her job in 06/2019.  ROS: Constitutional: no weight gain/+ weight loss, no fatigue, no subjective hyperthermia, no subjective hypothermia Eyes: no blurry vision, no xerophthalmia ENT: no sore throat, no nodules palpated in neck, no dysphagia, no odynophagia, no hoarseness Cardiovascular: no CP/no SOB/no palpitations/no leg swelling Respiratory: no cough/no SOB/no wheezing Gastrointestinal: no N/no V/no D/no C/no acid reflux Musculoskeletal: no muscle aches/no joint aches Skin: no rashes, no hair loss Neurological: no tremors/no numbness/no tingling/no dizziness  I reviewed pt's medications, allergies, PMH, social hx, family hx, and changes were documented in the history of present illness. Otherwise, unchanged from my initial visit note.  Past Medical History:  Diagnosis Date  . Blood transfusion without reported diagnosis at birth   . Cancer ([place]) 12/2016   ENDOMETRIAL CANCER  . Diabetes  mellitus without complication ([place])    TYPE 2  . Genetic testing 04/07/2017   Ms. Lips underwent genetic counseling and testing for hereditary cancer syndromes on 03/24/2017. There were no pathogenic mutations identified in any of the 46 genes analyzed by [facility]'s 46-gene Common Hereditary Cancers Panel. Genes analyzed include: APC, ATM, AXIN2, BARD1, BMPR1A, BRCA1, BRCA2, BRIP1, CDH1, CDKN2A, CHEK2, CTNNA1, DICER1, EPCAM, GREM1, HOXB13, KIT, MEN1, MLH1, MSH2, MSH3, MSH6  . Leg cramps    and feet OCC   . Pneumonia YRS AGO  . PONV (postoperative nausea and vomiting)    NAUSEA  . Skin rash 10/21/2016   CONTACT DERMITITIS OCC   Past Surgical History:  Procedure Laterality Date  . COLONOSCOPY  done at AGE 49   w/Dr.Mann=normal exam  . DILATATION & CURETTAGE/HYSTEROSCOPY WITH MYOSURE N/A 11/02/2016   Procedure: DILATATION & CURETTAGE/HYSTEROSCOPY WITH MYOSURE;  Surgeon: Molli Posey, MD;  Location: [facility];  Service: Gynecology;  Laterality: N/A;  . DILATION AND CURETTAGE OF UTERUS    . LYMPH NODE BIOPSY N/A 01/04/2017   Procedure: SENTINEL LYMPH NODE BIOPSY;  Surgeon: Everitt Amber, MD;  Location: [facility];  Service: Gynecology;  Laterality: N/A;  . ROBOTIC ASSISTED TOTAL HYSTERECTOMY WITH BILATERAL SALPINGO OOPHERECTOMY Bilateral 01/04/2017   Procedure: XI ROBOTIC ASSISTED TOTAL HYSTERECTOMY WITH BILATERAL SALPINGO OOPHORECTOMY;  Surgeon: Everitt Amber, MD;  Location: [facility];  Service: Gynecology;  Laterality: Bilateral;  . TUBAL LIGATION  YRS AGO   Social History   Social History  . Marital status: Single    Spouse name: N/A  . Number of children: 2   Occupational History  . Glass blower/designer    Social History Main Topics  . Smoking status: Never Smoker  . Smokeless tobacco: Never Used  . Alcohol use No  . Drug use: No   Current Outpatient Medications on File Prior to Visit  Medication Sig Dispense Refill  . blood glucose meter kit and supplies KIT Dispense based on patient and insurance preference. Use 2x times daily as directed. (FOR ICD-9 250.00, 250.01). 1 each 0  . glimepiride (AMARYL) 2 MG tablet TAKE 1 TABLET DAILY BEFORE SUPPER 90 tablet 4  . JARDIANCE 25 MG TABS tablet TAKE 1 TABLET DAILY 90 tablet 4  . metFORMIN (GLUCOPHAGE-XR) 500 MG 24 hr tablet TAKE 2 TABLETS TWICE A DAY 360 tablet 3   Current Facility-Administered Medications on File Prior to Visit  Medication Dose Route Frequency Provider Last Rate Last Admin  . 0.9 %  sodium chloride infusion  500 mL Intravenous  Once Nandigam, Venia Minks, MD       Allergies  Allergen Reactions  . Flagyl [Metronidazole] Rash   Family History  Problem Relation Age of Onset  . Heart Problems Father   . Cervical cancer Sister 34       full sister  . High blood pressure Son   . High blood pressure Son   . Breast cancer Sister 41       maternal half sister  . Melanoma Sister   . Bone cancer Mother 35       d.69  . Stomach cancer Maternal Grandfather 60       d.60s  . Colon cancer Paternal Grandmother 63       d.84  . Kidney cancer Paternal Grandfather 53       d.58   PE: BP 130/78 (BP Location: Left Arm, Patient Position: Sitting, Cuff Size: Normal)   Pulse 67   Ht '5\' 4"'  (1.626 m)  Wt 189 lb 12.8 oz (86.1 kg)   SpO2 97%   BMI 32.58 kg/m  Wt Readings from Last 3 Encounters:  01/21/20 189 lb 12.8 oz (86.1 kg)  06/29/19 195 lb (88.5 kg)  11/13/18 203 lb (92.1 kg)   Constitutional: overweight, in NAD Eyes: PERRLA, EOMI, no exophthalmos ENT: moist mucous membranes, no thyromegaly, no cervical lymphadenopathy Cardiovascular: RRR, No MRG Respiratory: CTA B Gastrointestinal: abdomen soft, NT, ND, BS+ Musculoskeletal: no deformities, strength intact in all 4 Skin: moist, warm, no rashes Neurological: no tremor with outstretched hands, DTR normal in all 4  ASSESSMENT: 1. DM2, non-insulin-dependent, uncontrolled, without long term complications, but with hyperglycemia  2. Obesity class 2 BMI Classification:  < 18.5 underweight   18.5-24.9 normal weight   25.0-29.9 overweight   30.0-34.9 class I obesity   35.0-39.9 class II obesity   ? 40.0 class III obesity   3. HL  PLAN:  1. Patient with history of uncontrolled diabetes with significant improvement in her diabetes control after starting Jardiance and continuing to improve her diet and eliminating sweet tea engines.  At last visit, her HbA1c was higher, at 7.5%.  She was previously on and off Jardiance due to yeast infections, treated with  Diflucan and then terconazole.  At last visit, on terconazole, she did not have any more yeast infections.  We increased the dose of her Metformin at that time and advised her to stop snacking after dinner -ice cream. -At this visit, sugars are slightly higher than target per review of her log but it is unclear which ones are checked before or after meals.  We discussed about moving down into columns to be easier to interpret.  She has occasional spikes in blood sugars, one of them at 200 with dietary indiscretions.  I again advised her to try to cut down sweet drinks completely.  She tells me that since last visit she cut down the dose of Metformin as some of her sugars were at goal.  She has been on maximal dose for the last 3 months.  We will continue this lower dose for now, but I did advise her that if she sees her sugars increasing, she can increase the dose back. - I suggested to:  Patient Instructions  Please continue: - Metformin XR 500 mg 2x a day - Jardiance 25 mg before first meal of the day  - Glimepiride 2 mg before dinner  Please return in 4 months with your sugar log.   - we checked her HbA1c: 6.1% (lower) - advised to check sugars at different times of the day - 1-2x a day, rotating check times - advised for yearly eye exams >> she is UTD - return to clinic in 4 months     2. Obesity class 2 -We discussed in the past about eliminating sweet tea and juice >> she occasionally still drinks these -She lost 8 pounds before last visit and 6 more since last visit -We will continue Jardiance which should also help with weight loss  3. HL -Regulated lipid panel from last visit with PCP and this was all at goal, but she tells me that this was checked after starting a statin 3 months prior years.  She tolerates the atorvastatin well. -She refused adding a statin in the past and she did not want to increase her pill burden.    Philemon Kingdom, MD PhD [facility] Endocrinology

## 2020-01-21 NOTE — Patient Instructions (Addendum)
Please continue: - Metformin XR 500 mg 2x a day - Jardiance 25 mg before first meal of the day  - Glimepiride 2 mg before dinner  Please return in 4 months with your sugar log.  

## 2020-02-14 DIAGNOSIS — E119 Type 2 diabetes mellitus without complications: Secondary | ICD-10-CM | POA: Diagnosis not present

## 2020-02-14 DIAGNOSIS — Z7984 Long term (current) use of oral hypoglycemic drugs: Secondary | ICD-10-CM | POA: Diagnosis not present

## 2020-02-14 DIAGNOSIS — C541 Malignant neoplasm of endometrium: Secondary | ICD-10-CM | POA: Diagnosis not present

## 2020-02-14 DIAGNOSIS — E1165 Type 2 diabetes mellitus with hyperglycemia: Secondary | ICD-10-CM | POA: Diagnosis not present

## 2020-02-14 DIAGNOSIS — E7800 Pure hypercholesterolemia, unspecified: Secondary | ICD-10-CM | POA: Diagnosis not present

## 2020-02-14 DIAGNOSIS — Z23 Encounter for immunization: Secondary | ICD-10-CM | POA: Diagnosis not present

## 2020-03-16 ENCOUNTER — Other Ambulatory Visit: Payer: Self-pay | Admitting: Internal Medicine

## 2020-03-18 DIAGNOSIS — E1165 Type 2 diabetes mellitus with hyperglycemia: Secondary | ICD-10-CM | POA: Diagnosis not present

## 2020-03-18 DIAGNOSIS — C541 Malignant neoplasm of endometrium: Secondary | ICD-10-CM | POA: Diagnosis not present

## 2020-03-18 DIAGNOSIS — Z7984 Long term (current) use of oral hypoglycemic drugs: Secondary | ICD-10-CM | POA: Diagnosis not present

## 2020-03-18 DIAGNOSIS — E7800 Pure hypercholesterolemia, unspecified: Secondary | ICD-10-CM | POA: Diagnosis not present

## 2020-03-18 DIAGNOSIS — E119 Type 2 diabetes mellitus without complications: Secondary | ICD-10-CM | POA: Diagnosis not present

## 2020-04-16 DIAGNOSIS — C541 Malignant neoplasm of endometrium: Secondary | ICD-10-CM | POA: Diagnosis not present

## 2020-04-16 DIAGNOSIS — E1165 Type 2 diabetes mellitus with hyperglycemia: Secondary | ICD-10-CM | POA: Diagnosis not present

## 2020-04-16 DIAGNOSIS — E7800 Pure hypercholesterolemia, unspecified: Secondary | ICD-10-CM | POA: Diagnosis not present

## 2020-04-16 DIAGNOSIS — E119 Type 2 diabetes mellitus without complications: Secondary | ICD-10-CM | POA: Diagnosis not present

## 2020-04-16 DIAGNOSIS — Z7984 Long term (current) use of oral hypoglycemic drugs: Secondary | ICD-10-CM | POA: Diagnosis not present

## 2020-05-14 DIAGNOSIS — E7800 Pure hypercholesterolemia, unspecified: Secondary | ICD-10-CM | POA: Diagnosis not present

## 2020-05-14 DIAGNOSIS — E119 Type 2 diabetes mellitus without complications: Secondary | ICD-10-CM | POA: Diagnosis not present

## 2020-05-14 DIAGNOSIS — E1165 Type 2 diabetes mellitus with hyperglycemia: Secondary | ICD-10-CM | POA: Diagnosis not present

## 2020-05-14 DIAGNOSIS — C541 Malignant neoplasm of endometrium: Secondary | ICD-10-CM | POA: Diagnosis not present

## 2020-05-20 ENCOUNTER — Ambulatory Visit: Payer: Medicare Other | Admitting: Internal Medicine

## 2020-07-28 DIAGNOSIS — Z124 Encounter for screening for malignant neoplasm of cervix: Secondary | ICD-10-CM | POA: Diagnosis not present

## 2020-07-28 DIAGNOSIS — Z779 Other contact with and (suspected) exposures hazardous to health: Secondary | ICD-10-CM | POA: Diagnosis not present

## 2020-07-28 DIAGNOSIS — Z6833 Body mass index (BMI) 33.0-33.9, adult: Secondary | ICD-10-CM | POA: Diagnosis not present

## 2020-07-28 DIAGNOSIS — Z1231 Encounter for screening mammogram for malignant neoplasm of breast: Secondary | ICD-10-CM | POA: Diagnosis not present

## 2020-07-29 ENCOUNTER — Other Ambulatory Visit: Payer: Self-pay | Admitting: Obstetrics and Gynecology

## 2020-07-29 DIAGNOSIS — R928 Other abnormal and inconclusive findings on diagnostic imaging of breast: Secondary | ICD-10-CM

## 2020-07-29 LAB — HM PAP SMEAR

## 2020-08-08 ENCOUNTER — Ambulatory Visit
Admission: RE | Admit: 2020-08-08 | Discharge: 2020-08-08 | Disposition: A | Discharge status: Home / Self Care | Payer: Medicare Other | Source: Ambulatory Visit | Attending: Obstetrics and Gynecology | Admitting: Obstetrics and Gynecology

## 2020-08-08 ENCOUNTER — Other Ambulatory Visit: Payer: Self-pay | Admitting: Obstetrics and Gynecology

## 2020-08-08 ENCOUNTER — Other Ambulatory Visit: Payer: Self-pay

## 2020-08-08 DIAGNOSIS — R928 Other abnormal and inconclusive findings on diagnostic imaging of breast: Secondary | ICD-10-CM

## 2020-08-08 DIAGNOSIS — N6489 Other specified disorders of breast: Secondary | ICD-10-CM | POA: Diagnosis not present

## 2020-08-08 DIAGNOSIS — R922 Inconclusive mammogram: Secondary | ICD-10-CM | POA: Diagnosis not present

## 2020-08-29 ENCOUNTER — Ambulatory Visit (INDEPENDENT_AMBULATORY_CARE_PROVIDER_SITE_OTHER): Payer: Medicare Other | Admitting: Internal Medicine

## 2020-08-29 ENCOUNTER — Encounter: Payer: Self-pay | Admitting: Internal Medicine

## 2020-08-29 ENCOUNTER — Other Ambulatory Visit: Payer: Self-pay

## 2020-08-29 VITALS — BP 130/60 | HR 82 | Ht 64.0 in | Wt 190.0 lb

## 2020-08-29 DIAGNOSIS — Z6835 Body mass index (BMI) 35.0-35.9, adult: Secondary | ICD-10-CM | POA: Diagnosis not present

## 2020-08-29 DIAGNOSIS — E785 Hyperlipidemia, unspecified: Secondary | ICD-10-CM | POA: Diagnosis not present

## 2020-08-29 DIAGNOSIS — E1165 Type 2 diabetes mellitus with hyperglycemia: Secondary | ICD-10-CM | POA: Diagnosis not present

## 2020-08-29 LAB — POCT GLYCOSYLATED HEMOGLOBIN (HGB A1C): Hemoglobin A1C: 6.4 % — AB (ref 4.0–5.6)

## 2020-08-29 NOTE — Patient Instructions (Signed)
Please continue: - Metformin XR 500 mg 2x a day - Jardiance 25 mg before first meal of the day  - Glimepiride 2 mg before dinner  Please return in 4 months with your sugar log.

## 2020-08-29 NOTE — Progress Notes (Signed)
Patient ID: Angela Mays, female   DOB: May 19, 1951, 69 y.o.   MRN: [id number]   This visit occurred during the SARS-CoV-2 public health emergency.  Safety protocols were in place, including screening questions prior to the visit, additional usage of staff PPE, and extensive cleaning of exam room while observing appropriate contact time as indicated for disinfecting solutions.   HPI: Angela Mays is a 69 y.o.-year-old female, returning for f/u for DM2, dx in 09/2014, non-insulin-dependent, uncontrolled, without long term complications. Last visit 7 months ago.  Reviewed HbA1c levels: Lab Results  Component Value Date   HGBA1C 6.1 (A) 01/21/2020   HGBA1C 7.5 (A) 06/29/2019   HGBA1C 6.2 (A) 11/13/2018  11/07/2019: HbA1c: 6.9%  Pt is on: - Metformin XR 1000 mg 2x a day with meals >> 500 >> 1000 >> 500 mg twice a day - Jardiance 25 mg before first meal of the day (on terconazole) - Glimepiride 2 mg before dinner  Pt is checking sugars 3-8x a day in the last 2 weeks: - am  110-143 >> 118-120 >> 120s >> 134, 138 >> 78, 95-118, 125 - 2h after b'fast: n/c >> 161-206, 251 >> n/c >> 147 >> 127-158 - before lunch: 87 >> 75-129 >> n/c >> ? >> n/c >> 113-132 - 2h after lunch: 115, 173 >> n/c >> 127 >> n/c >> 171 >> 157-162, 175 - before dinner:  78-187 >> 136-139 >> 148,176 >> 113-145, 156 - 2h after dinner: 194-262 >> 68, 180s, 200 >> 86- 198, 201 >> 159-192 - bedtime: n/c  - nighttime: n/c >> 95-205 >> 130 >> n/c >> 95-132 Highest: 192  Glucometer: One Touch ultra mini  Pt's meals are: - Breakfast: oatmeal or PB sandwich or bacon and eggs or cereals or skips during the week - Lunch: sandwich or spaghetti or fast foods - Dinner: largest - meat + veggies + starch - Snacks: 2-3: less chocolate, potato chips,  M&M She drinks sweet drinks rarely.  -No CKD last BUN/creatinine:  Lab Results  Component Value Date   BUN 23 07/03/2018   BUN 19 03/29/2017   CREATININE 0.71 07/03/2018    CREATININE 0.87 03/29/2017  03/29/2017: ACR undet. Not on ACE inhibitor/ARB.  -+ HL; last set of lipids: 11/07/2019: 162/58/57/94 Lab Results  Component Value Date   CHOL 200 07/03/2018   HDL 62.80 07/03/2018   LDLCALC 120 (H) 07/03/2018   TRIG 84.0 07/03/2018   CHOLHDL 3 07/03/2018  03/29/2017: 193/56/63/119 She was started on atorvastatin 10 mg daily 3 months before the last set of lipids  - last eye exam was in 01/2019: + Cataract, no DR. On Ocuvite. She does not have ocular insurance.  - no numbness and tingling in her feet.  She has a history of endometrial cancer and had hysterectomy.  She was laid off from her job in 06/2019.  ROS: Constitutional: no weight gain/no weight loss, no fatigue, no subjective hyperthermia, no subjective hypothermia Eyes: no blurry vision, no xerophthalmia ENT: no sore throat, no nodules palpated in neck, no dysphagia, no odynophagia, no hoarseness Cardiovascular: no CP/no SOB/no palpitations/no leg swelling Respiratory: no cough/no SOB/no wheezing Gastrointestinal: no N/no V/no D/no C/no acid reflux Musculoskeletal: no muscle aches/no joint aches Skin: no rashes, no hair loss Neurological: no tremors/no numbness/no tingling/no dizziness  I reviewed pt's medications, allergies, PMH, social hx, family hx, and changes were documented in the history of present illness. Otherwise, unchanged from my initial visit note.  Past Medical History:  Diagnosis  Date  . Blood transfusion without reported diagnosis at birth   . Cancer ([place]) 12/2016   ENDOMETRIAL CANCER  . Diabetes mellitus without complication ([place])    TYPE 2  . Genetic testing 04/07/2017   Ms. Dau underwent genetic counseling and testing for hereditary cancer syndromes on 03/24/2017. There were no pathogenic mutations identified in any of the 46 genes analyzed by [facility]'s 46-gene Common Hereditary Cancers Panel. Genes analyzed include: APC, ATM, AXIN2, BARD1, BMPR1A, BRCA1, BRCA2,  BRIP1, CDH1, CDKN2A, CHEK2, CTNNA1, DICER1, EPCAM, GREM1, HOXB13, KIT, MEN1, MLH1, MSH2, MSH3, MSH6  . Leg cramps    and feet OCC  . Pneumonia YRS AGO  . PONV (postoperative nausea and vomiting)    NAUSEA  . Skin rash 10/21/2016   CONTACT DERMITITIS OCC   Past Surgical History:  Procedure Laterality Date  . COLONOSCOPY  done at AGE 20   w/Dr.Mann=normal exam  . DILATATION & CURETTAGE/HYSTEROSCOPY WITH MYOSURE N/A 11/02/2016   Procedure: DILATATION & CURETTAGE/HYSTEROSCOPY WITH MYOSURE;  Surgeon: Molli Posey, MD;  Location: [facility];  Service: Gynecology;  Laterality: N/A;  . DILATION AND CURETTAGE OF UTERUS    . LYMPH NODE BIOPSY N/A 01/04/2017   Procedure: SENTINEL LYMPH NODE BIOPSY;  Surgeon: Everitt Amber, MD;  Location: [facility];  Service: Gynecology;  Laterality: N/A;  . ROBOTIC ASSISTED TOTAL HYSTERECTOMY WITH BILATERAL SALPINGO OOPHERECTOMY Bilateral 01/04/2017   Procedure: XI ROBOTIC ASSISTED TOTAL HYSTERECTOMY WITH BILATERAL SALPINGO OOPHORECTOMY;  Surgeon: Everitt Amber, MD;  Location: [facility];  Service: Gynecology;  Laterality: Bilateral;  . TUBAL LIGATION  YRS AGO   Social History   Social History  . Marital status: Single    Spouse name: N/A  . Number of children: 2   Occupational History  . Glass blower/designer    Social History Main Topics  . Smoking status: Never Smoker  . Smokeless tobacco: Never Used  . Alcohol use No  . Drug use: No   Current Outpatient Medications on File Prior to Visit  Medication Sig Dispense Refill  . blood glucose meter kit and supplies KIT Dispense based on patient and insurance preference. Use 2x times daily as directed. (FOR ICD-9 250.00, 250.01). 1 each 0  . glimepiride (AMARYL) 2 MG tablet TAKE 1 TABLET DAILY BEFORE SUPPER 90 tablet 3  . JARDIANCE 25 MG TABS tablet TAKE 1 TABLET DAILY 90 tablet 3  . metFORMIN (GLUCOPHAGE-XR) 500 MG 24 hr tablet TAKE 2 TABLETS TWICE A DAY 360 tablet 3   Current Facility-Administered Medications on File Prior  to Visit  Medication Dose Route Frequency Provider Last Rate Last Admin  . 0.9 %  sodium chloride infusion  500 mL Intravenous Once Nandigam, Venia Minks, MD       Allergies  Allergen Reactions  . Flagyl [Metronidazole] Rash   Family History  Problem Relation Age of Onset  . Heart Problems Father   . Cervical cancer Sister 54       full sister  . High blood pressure Son   . High blood pressure Son   . Breast cancer Sister 81       maternal half sister  . Melanoma Sister   . Bone cancer Mother 69       d.69  . Stomach cancer Maternal Grandfather 60       d.60s  . Colon cancer Paternal Grandmother 42       d.84  . Kidney cancer Paternal Grandfather 86       d.58   PE:  BP 130/60   Pulse 82   Ht _0  (1.626 m)   Wt 190 lb (86.2 kg)   SpO2 96%   BMI 32.61 kg/m  Wt Readings from Last 3 Encounters:  08/29/20 190 lb (86.2 kg)  01/21/20 189 lb 12.8 oz (86.1 kg)  06/29/19 195 lb (88.5 kg)   Constitutional: overweight, in NAD Eyes: PERRLA, EOMI, no exophthalmos ENT: moist mucous membranes, no thyromegaly, no cervical lymphadenopathy Cardiovascular: RRR, No MRG Respiratory: CTA B Gastrointestinal: abdomen soft, NT, ND, BS+ Musculoskeletal: no deformities, strength intact in all 4 Skin: moist, warm, no rashes Neurological: no tremor with outstretched hands, DTR normal in all 4  ASSESSMENT: 1. DM2, non-insulin-dependent, uncontrolled, without long term complications, but with hyperglycemia  2. Obesity class 2 BMI Classification:  < 18.5 underweight   18.5-24.9 normal weight   25.0-29.9 overweight   30.0-34.9 class I obesity   35.0-39.9 class II obesity   ? 40.0 class III obesity   3. HL  PLAN:  1. Patient with history of uncontrolled diabetes with significant improvement in her diabetes control after starting Jardiance and continuing to improve her diet and eliminating sweet drinks. At last visit HbA1c was excellent, at 6.1%. We did not change her regimen at  that time. She tolerates Metformin well. She is on terconazole for yeast infections associated with Jardiance. She also continues on sulfonylurea. -At last visit she had occasional hyperglycemic spikes in her blood sugars, only one of them in the 200s and we discussed about writing notes in her log about possible reasons for the spikes. I also again advised her to cut down sweet drinks completely. -At today's visit, we reviewed together her last 2 weeks of CBG data.  She checked consistently and it appears that her sugars are at goal in the morning and in the first half of the day but they were slightly higher before dinner.  This is likely because her lunch is close to dinnertime.  Sugars after dinner are higher, but still mostly in the normal range with few higher blood sugars but still under 200.  At bedtime sugars are all at goal.  Overall, I do not feel that we need to change her regimen at this visit but I again advised her to stop sweet drinks. - I suggested to:  Patient Instructions  Please continue: - Metformin XR 500 mg 2x a day - Jardiance 25 mg before first meal of the day  - Glimepiride 2 mg before dinner  Please return in 4 months with your sugar log.   - we checked her HbA1c: 6.4% (higher) - advised to check sugars at different times of the day - 1x a day, rotating check times - advised for yearly eye exams >> she is UTD - return to clinic in 4 months    2. Obesity class 2 -We discussed in the past about eliminating sweet tea and juice >> she occasionally drinks this -She lost 6 pounds before last visit and 8 pounds before the previous visit. -We will continue Jardiance which should also help with weight loss  3. HL -Latest lipid panel from PCP was at goal (10/2019) -Continues on atorvastatin, which she tolerates well.  Philemon Kingdom, MD PhD [facility] Endocrinology

## 2020-08-29 NOTE — Addendum Note (Signed)
Addended by: Cardell Peach I on: 08/29/2020 03:43 PM   Modules accepted: Orders

## 2020-09-03 DIAGNOSIS — Z0000 Encounter for general adult medical examination without abnormal findings: Secondary | ICD-10-CM | POA: Diagnosis not present

## 2020-09-03 DIAGNOSIS — E1169 Type 2 diabetes mellitus with other specified complication: Secondary | ICD-10-CM | POA: Diagnosis not present

## 2020-09-03 DIAGNOSIS — Z8542 Personal history of malignant neoplasm of other parts of uterus: Secondary | ICD-10-CM | POA: Diagnosis not present

## 2020-09-03 DIAGNOSIS — E7800 Pure hypercholesterolemia, unspecified: Secondary | ICD-10-CM | POA: Diagnosis not present

## 2021-01-06 ENCOUNTER — Ambulatory Visit: Payer: Medicare Other | Admitting: Internal Medicine

## 2021-01-30 ENCOUNTER — Other Ambulatory Visit: Payer: Self-pay | Admitting: Obstetrics and Gynecology

## 2021-01-30 DIAGNOSIS — N6489 Other specified disorders of breast: Secondary | ICD-10-CM

## 2021-02-12 ENCOUNTER — Other Ambulatory Visit: Payer: Self-pay | Admitting: Internal Medicine

## 2021-02-13 ENCOUNTER — Other Ambulatory Visit: Payer: Self-pay

## 2021-02-13 ENCOUNTER — Ambulatory Visit (INDEPENDENT_AMBULATORY_CARE_PROVIDER_SITE_OTHER): Payer: Medicare Other | Admitting: Internal Medicine

## 2021-02-13 ENCOUNTER — Encounter: Payer: Self-pay | Admitting: Internal Medicine

## 2021-02-13 VITALS — BP 140/90 | HR 71 | Ht 64.0 in | Wt 193.4 lb

## 2021-02-13 DIAGNOSIS — Z6835 Body mass index (BMI) 35.0-35.9, adult: Secondary | ICD-10-CM | POA: Diagnosis not present

## 2021-02-13 DIAGNOSIS — E785 Hyperlipidemia, unspecified: Secondary | ICD-10-CM

## 2021-02-13 DIAGNOSIS — E1165 Type 2 diabetes mellitus with hyperglycemia: Secondary | ICD-10-CM | POA: Diagnosis not present

## 2021-02-13 LAB — POCT GLYCOSYLATED HEMOGLOBIN (HGB A1C): Hemoglobin A1C: 6.4 % — AB (ref 4.0–5.6)

## 2021-02-13 MED ORDER — METFORMIN HCL ER 500 MG PO TB24
500.0000 mg | ORAL_TABLET | Freq: Two times a day (BID) | ORAL | 3 refills | Status: DC
Start: 2021-02-13 — End: 2021-09-08

## 2021-02-13 MED ORDER — EMPAGLIFLOZIN 25 MG PO TABS
25.0000 mg | ORAL_TABLET | Freq: Every day | ORAL | 3 refills | Status: DC
Start: 2021-02-13 — End: 2022-02-08

## 2021-02-13 MED ORDER — GLIMEPIRIDE 2 MG PO TABS
ORAL_TABLET | ORAL | 3 refills | Status: DC
Start: 2021-02-13 — End: 2022-02-08

## 2021-02-13 NOTE — Progress Notes (Signed)
Patient ID: Angela Mays, female   DOB: 11/15/1951, 70 y.o.   MRN: [id number]   This visit occurred during the SARS-CoV-2 public health emergency.  Safety protocols were in place, including screening questions prior to the visit, additional usage of staff PPE, and extensive cleaning of exam room while observing appropriate contact time as indicated for disinfecting solutions.   HPI: Angela Mays is a 70 y.o.-year-old female, returning for f/u for DM2, dx in 09/2014, non-insulin-dependent, uncontrolled, without long term complications. Last visit 5.5 months ago.  Reviewed HbA1c levels: Lab Results  Component Value Date   HGBA1C 6.4 (A) 08/29/2020   HGBA1C 6.1 (A) 01/21/2020   HGBA1C 7.5 (A) 06/29/2019  11/07/2019: HbA1c: 6.9%  Pt is on: - Metformin XR 1000 mg 2x a day with meals >> 500 >> 1000 >> 500 mg twice a day - Jardiance 25 mg before first meal of the day (on terconazole) - Glimepiride 2 mg before dinner  Pt is checking sugars  times a day: - am  120s >> 134, 138 >> 78, 95-118, 125 >> 78-125 - 2h after b'fast: 161-206, 251 >> n/c >> 147 >> 127-158 >> 127-172 - before lunch: n/c >> ? >> n/c >> 113-132 >> 92-132, 164 - 2h after lunch: 127 >> n/c >> 171 >> 157-162, 175 >> 152-181, 193 - before dinner: 148,176 >> 113-145, 156 >> 113-156 - 2h after dinner: 86- 198, 201 >> 159-192 >> 167-197 - bedtime: n/c >> 153-167 - nighttime: n/c >> 95-205 >> 130 >> n/c >> 95-132 >> n/c Lowest: 78 Highest: 192 >> 197  Glucometer: One Touch ultra mini  Pt's meals are: - Breakfast: oatmeal or PB sandwich or bacon and eggs or cereals or skips during the week - Lunch: sandwich or spaghetti or fast foods - Dinner: largest - meat + veggies + starch - Snacks: 2-3: less chocolate, potato chips,  M&M She rarely has sweet drinks.  -No CKD last BUN/creatinine:  09/03/2020: 15/0.73, glucose 111 Lab Results  Component Value Date   BUN 23 07/03/2018   BUN 19 03/29/2017   CREATININE 0.71  07/03/2018   CREATININE 0.87 03/29/2017  03/29/2017: ACR undet. She is not on ACE inhibitor/ARB.  -+ HL; last set of lipids: 09/03/2020: 146/63/50/79 11/07/2019: 162/58/57/94 Lab Results  Component Value Date   CHOL 200 07/03/2018   HDL 62.80 07/03/2018   LDLCALC 120 (H) 07/03/2018   TRIG 84.0 07/03/2018   CHOLHDL 3 07/03/2018  03/29/2017: 193/56/63/119 On atorvastatin 10 mg daily  - last eye exam was in 01/2019: + Cataract, no DR. On Ocuvite. She did not have ocular insurance >> now got one.  - no numbness and tingling in her feet.  She has a history of endometrial cancer and had hysterectomy.  She was laid off from her job in 06/2019.  She is going to [employer].  ROS: Constitutional:+ weight gain/no weight loss, no fatigue, no subjective hyperthermia, no subjective hypothermia Eyes: no blurry vision, no xerophthalmia ENT: no sore throat, no nodules palpated in neck, no dysphagia, no odynophagia, no hoarseness Cardiovascular: no CP/no SOB/no palpitations/no leg swelling Respiratory: no cough/no SOB/no wheezing Gastrointestinal: no N/no V/no D/no C/no acid reflux Musculoskeletal: no muscle aches/no joint aches Skin: no rashes, no hair loss Neurological: no tremors/no numbness/no tingling/no dizziness  I reviewed pt's medications, allergies, PMH, social hx, family hx, and changes were documented in the history of present illness. Otherwise, unchanged from my initial visit note.  Past Medical History:  Diagnosis Date  .  Blood transfusion without reported diagnosis at birth   . Cancer ([place]) 12/2016   ENDOMETRIAL CANCER  . Diabetes mellitus without complication ([place])    TYPE 2  . Genetic testing 04/07/2017   Ms. Cozart underwent genetic counseling and testing for hereditary cancer syndromes on 03/24/2017. There were no pathogenic mutations identified in any of the 46 genes analyzed by [facility]'s 46-gene Common Hereditary Cancers Panel. Genes analyzed include: APC, ATM,  AXIN2, BARD1, BMPR1A, BRCA1, BRCA2, BRIP1, CDH1, CDKN2A, CHEK2, CTNNA1, DICER1, EPCAM, GREM1, HOXB13, KIT, MEN1, MLH1, MSH2, MSH3, MSH6  . Leg cramps    and feet OCC  . Pneumonia YRS AGO  . PONV (postoperative nausea and vomiting)    NAUSEA  . Skin rash 10/21/2016   CONTACT DERMITITIS OCC   Past Surgical History:  Procedure Laterality Date  . COLONOSCOPY  done at AGE 30   w/Dr.Mann=normal exam  . DILATATION & CURETTAGE/HYSTEROSCOPY WITH MYOSURE N/A 11/02/2016   Procedure: DILATATION & CURETTAGE/HYSTEROSCOPY WITH MYOSURE;  Surgeon: Molli Posey, MD;  Location: [facility];  Service: Gynecology;  Laterality: N/A;  . DILATION AND CURETTAGE OF UTERUS    . LYMPH NODE BIOPSY N/A 01/04/2017   Procedure: SENTINEL LYMPH NODE BIOPSY;  Surgeon: Everitt Amber, MD;  Location: [facility];  Service: Gynecology;  Laterality: N/A;  . ROBOTIC ASSISTED TOTAL HYSTERECTOMY WITH BILATERAL SALPINGO OOPHERECTOMY Bilateral 01/04/2017   Procedure: XI ROBOTIC ASSISTED TOTAL HYSTERECTOMY WITH BILATERAL SALPINGO OOPHORECTOMY;  Surgeon: Everitt Amber, MD;  Location: [facility];  Service: Gynecology;  Laterality: Bilateral;  . TUBAL LIGATION  YRS AGO   Social History   Social History  . Marital status: Single    Spouse name: N/A  . Number of children: 2   Occupational History  . Glass blower/designer    Social History Main Topics  . Smoking status: Never Smoker  . Smokeless tobacco: Never Used  . Alcohol use No  . Drug use: No   Current Outpatient Medications on File Prior to Visit  Medication Sig Dispense Refill  . blood glucose meter kit and supplies KIT Dispense based on patient and insurance preference. Use 2x times daily as directed. (FOR ICD-9 250.00, 250.01). 1 each 0  . glimepiride (AMARYL) 2 MG tablet TAKE 1 TABLET DAILY BEFORE SUPPER 90 tablet 3  . JARDIANCE 25 MG TABS tablet TAKE 1 TABLET DAILY 90 tablet 3  . metFORMIN (GLUCOPHAGE-XR) 500 MG 24 hr tablet TAKE 2 TABLETS TWICE A DAY 360 tablet 3   Current  Facility-Administered Medications on File Prior to Visit  Medication Dose Route Frequency Provider Last Rate Last Admin  . 0.9 %  sodium chloride infusion  500 mL Intravenous Once Nandigam, Venia Minks, MD       Allergies  Allergen Reactions  . Flagyl [Metronidazole] Rash   Family History  Problem Relation Age of Onset  . Heart Problems Father   . Cervical cancer Sister 75       full sister  . High blood pressure Son   . High blood pressure Son   . Breast cancer Sister 64       maternal half sister  . Melanoma Sister   . Bone cancer Mother 34       d.69  . Stomach cancer Maternal Grandfather 60       d.60s  . Colon cancer Paternal Grandmother 60       d.84  . Kidney cancer Paternal Grandfather 60       d.58   PE: BP 140/90  Pulse 71   Ht 5' 4"  (1.626 m)   Wt 193 lb 6.4 oz (87.7 kg)   SpO2 98%   BMI 33.20 kg/m  Wt Readings from Last 3 Encounters:  02/13/21 193 lb 6.4 oz (87.7 kg)  08/29/20 190 lb (86.2 kg)  01/21/20 189 lb 12.8 oz (86.1 kg)   Constitutional: overweight, in NAD Eyes: PERRLA, EOMI, no exophthalmos ENT: moist mucous membranes, no thyromegaly, no cervical lymphadenopathy Cardiovascular: RRR, No MRG Respiratory: CTA B Gastrointestinal: abdomen soft, NT, ND, BS+ Musculoskeletal: no deformities, strength intact in all 4 Skin: moist, warm, no rashes Neurological: no tremor with outstretched hands, DTR normal in all 4  ASSESSMENT: 1. DM2, non-insulin-dependent, uncontrolled, without long term complications, but with hyperglycemia  2. Obesity class 2 BMI Classification:  < 18.5 underweight   18.5-24.9 normal weight   25.0-29.9 overweight   30.0-34.9 class I obesity   35.0-39.9 class II obesity   ? 40.0 class III obesity   3. HL  PLAN:  1. Patient with history of uncontrolled type 2 diabetes with significant improvement in her diabetes control after starting Jardiance and continuing to improve her diet and reducing sweet drinks.  HbA1c was  still at goal at last visit at 6.4%, slight increase from 6.1% previously.  At that time, her sugars were at goal in the morning and the first half of the day but they were slightly higher before dinner.  She explained that her lunch was actually late and closer to dinnertime.  Sugars after dinner were higher but mostly in the normal range and sugars at bedtime are at goal.  We did not change her regimen at that time but I strongly advised her to completely stop sweet drinks. -At today's visit, she brings me 2 weeks of data, but most of the data points are similar to last visit.  I am wondering whether she transcribed a different time period from her glucometer... She doubts that this is what happened.  Also, HbA1c today: 6.4%, which is stable, consistent with her readings.  However, she occasionally has some higher blood sugar in the 180 or even 190.  We discussed about using glimepiride at a slightly higher dose before larger dinners, but otherwise I would not suggest changing the regimen. -I refilled her diabetic prescriptions. -She is preparing to go to [employer].  Due to the increased activity, I advised her to not take glimepiride while there, to avoid hypoglycemia - I suggested to:  Patient Instructions  Please continue: - Metformin XR 500 mg 2x a day - Jardiance 25 mg before first meal of the day   Please use - Glimepiride 2 mg before dinner (but 4 mg before a larger meal - if you go out to eat or have dessert)  Please return in 4 months with your sugar log.   - advised to check sugars at different times of the day - 1x a day, rotating check times - advised for yearly eye exams >> she is UTD - return to clinic in 4 months  2. Obesity class 2 -We discussed in the past about eliminating sweet tea and juice >> she still occasionally drinks this -We will continue Jardiance which should also help with weight loss -She gained 3 pounds since last visit  3. HL - Reviewed latest lipid  panel from 08/2020: 146/63/61/79-all fractions at goal - Continues atorvastatin 10 mg daily without side effects  Philemon Kingdom, MD PhD [facility] Endocrinology

## 2021-02-13 NOTE — Patient Instructions (Addendum)
Please continue: - Metformin XR 500 mg 2x a day - Jardiance 25 mg before first meal of the day   Please use - Glimepiride 2 mg before dinner (but 4 mg before a larger meal - if you go out to eat or have dessert)  Please return in 4 months with your sugar log.

## 2021-06-25 ENCOUNTER — Ambulatory Visit: Payer: Medicare Other | Admitting: Internal Medicine

## 2021-06-28 DIAGNOSIS — Z20822 Contact with and (suspected) exposure to covid-19: Secondary | ICD-10-CM | POA: Diagnosis not present

## 2021-08-18 ENCOUNTER — Other Ambulatory Visit: Payer: Self-pay

## 2021-08-18 ENCOUNTER — Ambulatory Visit
Admission: RE | Admit: 2021-08-18 | Discharge: 2021-08-18 | Disposition: A | Discharge status: Home / Self Care | Payer: Medicare Other | Source: Ambulatory Visit | Attending: Obstetrics and Gynecology | Admitting: Obstetrics and Gynecology

## 2021-08-18 ENCOUNTER — Ambulatory Visit: Payer: Medicare Other

## 2021-08-18 DIAGNOSIS — N6489 Other specified disorders of breast: Secondary | ICD-10-CM

## 2021-08-18 DIAGNOSIS — R922 Inconclusive mammogram: Secondary | ICD-10-CM | POA: Diagnosis not present

## 2021-09-07 DIAGNOSIS — I10 Essential (primary) hypertension: Secondary | ICD-10-CM | POA: Diagnosis not present

## 2021-09-07 DIAGNOSIS — Z0000 Encounter for general adult medical examination without abnormal findings: Secondary | ICD-10-CM | POA: Diagnosis not present

## 2021-09-07 DIAGNOSIS — E7800 Pure hypercholesterolemia, unspecified: Secondary | ICD-10-CM | POA: Diagnosis not present

## 2021-09-07 DIAGNOSIS — E1169 Type 2 diabetes mellitus with other specified complication: Secondary | ICD-10-CM | POA: Diagnosis not present

## 2021-09-07 LAB — HM DIABETES FOOT EXAM

## 2021-09-08 ENCOUNTER — Encounter: Payer: Self-pay | Admitting: Internal Medicine

## 2021-09-08 ENCOUNTER — Other Ambulatory Visit: Payer: Self-pay

## 2021-09-08 ENCOUNTER — Ambulatory Visit (INDEPENDENT_AMBULATORY_CARE_PROVIDER_SITE_OTHER): Payer: Medicare Other | Admitting: Internal Medicine

## 2021-09-08 VITALS — BP 140/80 | HR 65 | Ht 64.0 in | Wt 192.8 lb

## 2021-09-08 DIAGNOSIS — E785 Hyperlipidemia, unspecified: Secondary | ICD-10-CM

## 2021-09-08 DIAGNOSIS — E1165 Type 2 diabetes mellitus with hyperglycemia: Secondary | ICD-10-CM | POA: Diagnosis not present

## 2021-09-08 DIAGNOSIS — Z6835 Body mass index (BMI) 35.0-35.9, adult: Secondary | ICD-10-CM

## 2021-09-08 LAB — POCT GLYCOSYLATED HEMOGLOBIN (HGB A1C): Hemoglobin A1C: 6.9 % — AB (ref 4.0–5.6)

## 2021-09-08 MED ORDER — METFORMIN HCL ER 500 MG PO TB24
1000.0000 mg | ORAL_TABLET | Freq: Two times a day (BID) | ORAL | 3 refills | Status: DC
Start: 2021-09-08 — End: 2023-04-13

## 2021-09-08 NOTE — Progress Notes (Signed)
Patient ID: Angela Mays, female   DOB: 08-Sep-1951, 70 y.o.   MRN: [id number]   This visit occurred during the SARS-CoV-2 public health emergency.  Safety protocols were in place, including screening questions prior to the visit, additional usage of staff PPE, and extensive cleaning of exam room while observing appropriate contact time as indicated for disinfecting solutions.   HPI: Angela Mays is a 70 y.o.-year-old female, returning for f/u for DM2, dx in 09/2014, non-insulin-dependent, uncontrolled, without long term complications. Last visit 7 months ago.  Interim history: No increased urination, nausea, chest pain. She has cataracts >> blurry vision.  Has an eye exam coming up. She relaxed her diet since last visit, goes to eatat lunch or dinner sugars are higher after these meals. Her sister just died this month - renal failure.   Reviewed HbA1c levels: Lab Results  Component Value Date   HGBA1C 6.4 (A) 02/13/2021   HGBA1C 6.4 (A) 08/29/2020   HGBA1C 6.1 (A) 01/21/2020  11/07/2019: HbA1c: 6.9%  Pt is on: - Metformin XR 1000 mg 2x a day with meals >> 500 >> 1000 >> 500 mg twice a day - Jardiance 25 mg before first meal of the day (on terconazole) - Glimepiride 2 >> 2-4 mg before dinner  Pt is checking sugars  times a day: - am  120s >> 134, 138 >> 78, 95-118, 125 >> 78-125 >> 89-127 - 2h after b'fast: n/c >> 147 >> 127-158 >> 127-172 >> 174 - before lunch: n/c >> 113-132 >> 92-132, 164 >> 115-129 - 2h after lunch: 157-162, 175 >> 152-181, 193 >> 176-216 - before dinner: 148,176 >> 113-145, 156 >> 113-156 >> 80-169 - 2h after dinner: 86- 198, 201 >> 159-192 >> 167-197 >> 186-208 - bedtime: n/c >> 153-167 >> 70-154  - nighttime: 95-205 >> 130 >> n/c >> 95-132 >> n/c >> 99-111 Lowest: 78 >> 80 Highest: 192 >> 197 >> 208  Glucometer: One Touch ultra mini  Pt's meals are: - Breakfast: oatmeal or PB sandwich or bacon and eggs or cereals or skips during the week - Lunch:  sandwich or spaghetti or fast foods - Dinner: largest - meat + veggies + starch - Snacks: 2-3: less chocolate, potato chips,  M&M She rarely has sweet drinks.  -No CKD last BUN/creatinine:  09/07/2021: 15/0.75, Glu 112 09/03/2020: 15/0.73, glucose 111 Lab Results  Component Value Date   BUN 23 07/03/2018   BUN 19 03/29/2017   CREATININE 0.71 07/03/2018   CREATININE 0.87 03/29/2017  03/29/2017: ACR undet. She is not on ACE inhibitor/ARB.  -+ HL; last set of lipids: 09/07/2021: 152/72/51/87 09/03/2020: 146/63/50/79 11/07/2019: 162/58/57/94 Lab Results  Component Value Date   CHOL 200 07/03/2018   HDL 62.80 07/03/2018   LDLCALC 120 (H) 07/03/2018   TRIG 84.0 07/03/2018   CHOLHDL 3 07/03/2018  03/29/2017: 193/56/63/119 On atorvastatin 10 mg daily  - last eye exam was in 01/2019: + Cataract, no DR. On Ocuvite. She did not have ocular insurance >> now got one. Has an exam soon.  - no numbness and tingling in her feet.  She has a history of endometrial cancer and had hysterectomy.  She was laid off from her job in 06/2019.  She is going to [employer].  ROS: Constitutional:+ weight gain/no weight loss, no fatigue, no subjective hyperthermia, no subjective hypothermia Eyes: + blurry vision, no xerophthalmia ENT: no sore throat, no nodules palpated in neck, no dysphagia, no odynophagia, no hoarseness Cardiovascular: no CP/no SOB/no palpitations/no leg  swelling Respiratory: no cough/no SOB/no wheezing Gastrointestinal: no N/no V/no D/no C/no acid reflux Musculoskeletal: no muscle aches/no joint aches Skin: no rashes, no hair loss Neurological: no tremors/no numbness/no tingling/no dizziness  I reviewed pt's medications, allergies, PMH, social hx, family hx, and changes were documented in the history of present illness. Otherwise, unchanged from my initial visit note.  Past Medical History:  Diagnosis Date   Blood transfusion without reported diagnosis at birth    Cancer  [facility]) 12/2016   ENDOMETRIAL CANCER   Diabetes mellitus without complication ([place])    TYPE 2   Genetic testing 04/07/2017   Ms. Streiff underwent genetic counseling and testing for hereditary cancer syndromes on 03/24/2017. There were no pathogenic mutations identified in any of the 46 genes analyzed by [facility]'s 46-gene Common Hereditary Cancers Panel. Genes analyzed include: APC, ATM, AXIN2, BARD1, BMPR1A, BRCA1, BRCA2, BRIP1, CDH1, CDKN2A, CHEK2, CTNNA1, DICER1, EPCAM, GREM1, HOXB13, KIT, MEN1, MLH1, MSH2, MSH3, MSH6   Leg cramps    and feet OCC   Pneumonia YRS AGO   PONV (postoperative nausea and vomiting)    NAUSEA   Skin rash 10/21/2016   CONTACT DERMITITIS OCC   Past Surgical History:  Procedure Laterality Date   COLONOSCOPY  done at AGE 22   w/Dr.Mann=normal exam   DILATATION & CURETTAGE/HYSTEROSCOPY WITH MYOSURE N/A 11/02/2016   Procedure: [place];  Surgeon: Molli Posey, MD;  Location: [facility];  Service: Gynecology;  Laterality: N/A;   DILATION AND CURETTAGE OF UTERUS     LYMPH NODE BIOPSY N/A 01/04/2017   Procedure: SENTINEL LYMPH NODE BIOPSY;  Surgeon: Everitt Amber, MD;  Location: [facility];  Service: Gynecology;  Laterality: N/A;   ROBOTIC ASSISTED TOTAL HYSTERECTOMY WITH BILATERAL SALPINGO OOPHERECTOMY Bilateral 01/04/2017   Procedure: XI ROBOTIC ASSISTED TOTAL HYSTERECTOMY WITH BILATERAL SALPINGO OOPHORECTOMY;  Surgeon: Everitt Amber, MD;  Location: [facility];  Service: Gynecology;  Laterality: Bilateral;   TUBAL LIGATION  YRS AGO   Social History   Social History   Marital status: Single    Spouse name: N/A   Number of children: 2   Occupational History   Glass blower/designer    Social History Main Topics   Smoking status: Never Smoker   Smokeless tobacco: Never Used   Alcohol use No   Drug use: No   Current Outpatient Medications on File Prior to Visit  Medication Sig Dispense Refill   blood glucose meter kit and supplies KIT Dispense  based on patient and insurance preference. Use 2x times daily as directed. (FOR ICD-9 250.00, 250.01). 1 each 0   empagliflozin (JARDIANCE) 25 MG TABS tablet Take 1 tablet (25 mg total) by mouth daily. 90 tablet 3   glimepiride (AMARYL) 2 MG tablet Take 1 tablet before dinner 90 tablet 3   metFORMIN (GLUCOPHAGE-XR) 500 MG 24 hr tablet Take 1 tablet (500 mg total) by mouth 2 (two) times daily before a meal. 180 tablet 3   Current Facility-Administered Medications on File Prior to Visit  Medication Dose Route Frequency Provider Last Rate Last Admin   0.9 %  sodium chloride infusion  500 mL Intravenous Once Nandigam, Venia Minks, MD       Allergies  Allergen Reactions   Flagyl [Metronidazole] Rash   Family History  Problem Relation Age of Onset   Heart Problems Father    Cervical cancer Sister 71       full sister   High blood pressure Son    High blood pressure  Son    Breast cancer Sister 2       maternal half sister   Melanoma Sister    Bone cancer Mother 39       d.69   Stomach cancer Maternal Grandfather 3       d.60s   Colon cancer Paternal Grandmother 20       d.16   Kidney cancer Paternal Grandfather 80       d.58   PE: BP 140/80 (BP Location: Left Arm, Patient Position: Sitting, Cuff Size: Normal)   Pulse 65   Ht '5\' 4"'  (1.626 m)   Wt 192 lb 12.8 oz (87.5 kg)   SpO2 98%   BMI 33.09 kg/m  Wt Readings from Last 3 Encounters:  09/08/21 192 lb 12.8 oz (87.5 kg)  02/13/21 193 lb 6.4 oz (87.7 kg)  08/29/20 190 lb (86.2 kg)   Constitutional: overweight, in NAD Eyes: PERRLA, EOMI, no exophthalmos ENT: moist mucous membranes, no thyromegaly, no cervical lymphadenopathy Cardiovascular: RRR, No MRG Respiratory: CTA B Gastrointestinal: abdomen soft, NT, ND, BS+ Musculoskeletal: no deformities, strength intact in all 4 Skin: moist, warm, no rashes Neurological: no tremor with outstretched hands, DTR normal in all 4  ASSESSMENT: 1. DM2, non-insulin-dependent,  uncontrolled, without long term complications, but with hyperglycemia  2. Obesity class 2 BMI Classification: < 18.5 underweight  18.5-24.9 normal weight  25.0-29.9 overweight  30.0-34.9 class I obesity  35.0-39.9 class II obesity  ? 40.0 class III obesity   3. HL  PLAN:  1. Patient with history of uncontrolled type 2 diabetes with significant improvement in her diabetes control after starting to improve her diet, reducing sweet drinks, and starting Jardiance.  HbA1c at last visit, was stable, at 6.4%.  At that time, we continued the same regimen but I did advise her to take the higher dose of glimepiride before dinner if she had dessert after dinner or with eating out. -At last visit, sugars were similar to before so I suspected that she may have transcribed a different time period from her glucometer... -At today's visit, sugars are at goal fasting and at bedtime, but they are high after meals, consistent with her eating out for lunch or dinner.  She is not usually increasing the dose of glimepiride before these meals and I advised her to start doing so.  We will also try to increase the metformin dose to 1000 mg twice a day.  Upon questioning, she is taking Jardiance in the evening and I advised her to move into morning to cover her postprandial blood sugars better. - I suggested to:  Patient Instructions  Please increase: - Metformin XR 1000 mg 2x a day  Use: - Glimepiride 2-4 mg before a larger meal or if eating out  Please move: - Jardiance 25 mg before first meal of the day   Please return in 4 months with your sugar log.   - we checked her HbA1c: 6.9% (higher) - advised to check sugars at different times of the day - 1x a day, rotating check times - advised for yearly eye exams >> she is UTD - return to clinic in 4 months  2. Obesity class 2 -We discussed in the past about eliminating sweet tea and juice >> she only occasionally drinks this now -We will continue Jardiance  which should also help with weight loss -She gained 3 pounds before last visit and 2 pounds since then  3. HL -Reviewed latest lipid panel from 09/07/2021: 152/72/51/87-all fractions  at goal -She is on atorvastatin 10 mg daily without side effects  Philemon Kingdom, MD PhD [facility] Endocrinology

## 2021-09-08 NOTE — Patient Instructions (Addendum)
Please increase: - Metformin XR 1000 mg 2x a day  Use: - Glimepiride 2-4 mg before a larger meal or if eating out  Please move: - Jardiance 25 mg before first meal of the day   Please return in 4 months with your sugar log.

## 2021-10-07 DIAGNOSIS — H2513 Age-related nuclear cataract, bilateral: Secondary | ICD-10-CM | POA: Diagnosis not present

## 2021-10-07 DIAGNOSIS — E119 Type 2 diabetes mellitus without complications: Secondary | ICD-10-CM | POA: Diagnosis not present

## 2022-01-12 DIAGNOSIS — H25043 Posterior subcapsular polar age-related cataract, bilateral: Secondary | ICD-10-CM | POA: Diagnosis not present

## 2022-01-12 DIAGNOSIS — H25013 Cortical age-related cataract, bilateral: Secondary | ICD-10-CM | POA: Diagnosis not present

## 2022-01-12 DIAGNOSIS — H18413 Arcus senilis, bilateral: Secondary | ICD-10-CM | POA: Diagnosis not present

## 2022-01-12 DIAGNOSIS — H2513 Age-related nuclear cataract, bilateral: Secondary | ICD-10-CM | POA: Diagnosis not present

## 2022-01-12 DIAGNOSIS — H2512 Age-related nuclear cataract, left eye: Secondary | ICD-10-CM | POA: Diagnosis not present

## 2022-01-13 ENCOUNTER — Other Ambulatory Visit: Payer: Self-pay

## 2022-01-13 ENCOUNTER — Ambulatory Visit (INDEPENDENT_AMBULATORY_CARE_PROVIDER_SITE_OTHER): Payer: Medicare Other | Admitting: Internal Medicine

## 2022-01-13 ENCOUNTER — Encounter: Payer: Self-pay | Admitting: Internal Medicine

## 2022-01-13 VITALS — BP 120/72 | HR 68 | Ht 64.0 in | Wt 192.8 lb

## 2022-01-13 DIAGNOSIS — Z20822 Contact with and (suspected) exposure to covid-19: Secondary | ICD-10-CM | POA: Diagnosis not present

## 2022-01-13 DIAGNOSIS — E1165 Type 2 diabetes mellitus with hyperglycemia: Secondary | ICD-10-CM | POA: Diagnosis not present

## 2022-01-13 DIAGNOSIS — Z6835 Body mass index (BMI) 35.0-35.9, adult: Secondary | ICD-10-CM | POA: Diagnosis not present

## 2022-01-13 DIAGNOSIS — E785 Hyperlipidemia, unspecified: Secondary | ICD-10-CM

## 2022-01-13 LAB — POCT GLYCOSYLATED HEMOGLOBIN (HGB A1C): Hemoglobin A1C: 6.8 % — AB (ref 4.0–5.6)

## 2022-01-13 NOTE — Progress Notes (Signed)
Patient ID: Angela Mays, female   DOB: 30-Jun-1951, 71 y.o.   MRN: [id number]   This visit occurred during the SARS-CoV-2 public health emergency.  Safety protocols were in place, including screening questions prior to the visit, additional usage of staff PPE, and extensive cleaning of exam room while observing appropriate contact time as indicated for disinfecting solutions.   HPI: Angela Mays is a 71 y.o.-year-old female, returning for f/u for DM2, dx in 09/2014, non-insulin-dependent, uncontrolled, without long term complications. Last visit 4 months ago.  Interim history: Her sister just died before last visit- renal failure.  Her sugars were higher as she relaxed her diet.   No increased urination, nausea, chest pain.  She continues to have blurry vision (cataracts) -for which she will have surgery in 3 months.  Reviewed HbA1c levels: Lab Results  Component Value Date   HGBA1C 6.9 (A) 09/08/2021   HGBA1C 6.4 (A) 02/13/2021   HGBA1C 6.4 (A) 08/29/2020  11/07/2019: HbA1c: 6.9%  Pt is on: - Metformin XR 1500 >> 1000 mg twice a day - Jardiance 25 mg before first meal of the day (on terconazole) - Glimepiride 2 >> 2(-4) mg before or after dinner  Pt is checking sugars  times a day: - am  78, 95-118, 125 >> 78-125 >> 89-127 >> 97-144, 155 - 2h after b'fast: 127-158 >> 127-172 >> 174 >> 151-167 - before lunch: 92-132, 164 >> 115-129 >> 110-139, 155 - 2h after lunch: 152-181, 193 >> 176-216 >> 78, 143-182 - before dinner: 113-156 >> 80-169 >> 76, 89-149, 153 - 2h after dinner: 159-192 >> 167-197 >> 186-208 >> 157-195 - bedtime: n/c >> 153-167 >> 70-154 >> 124 - nighttime: 95-205 >> 130 >> n/c >> 95-132 >> n/c >> 99-111 >> 84-139 Lowest: 78 >> 80 >> 76 Highest: 192 >> 197 >> 208 >> 195  Glucometer: One Touch ultra mini  Pt's meals are: - Breakfast: oatmeal or PB sandwich or bacon and eggs or cereals or skips during the week - Lunch: sandwich or spaghetti or fast foods -  Dinner: largest - meat + veggies + starch - Snacks: 2-3: less chocolate, potato chips,  M&M She rarely has sweet drinks.  -No CKD last BUN/creatinine:  09/07/2021: 15/0.75, Glu 112 09/03/2020: 15/0.73, glucose 111 Lab Results  Component Value Date   BUN 23 07/03/2018   BUN 19 03/29/2017   CREATININE 0.71 07/03/2018   CREATININE 0.87 03/29/2017  03/29/2017: ACR undet. She is not on ACE inhibitor/ARB.  -+ HL; last set of lipids: 09/07/2021: 152/72/51/87 09/03/2020: 146/63/50/79 11/07/2019: 162/58/57/94 Lab Results  Component Value Date   CHOL 200 07/03/2018   HDL 62.80 07/03/2018   LDLCALC 120 (H) 07/03/2018   TRIG 84.0 07/03/2018   CHOLHDL 3 07/03/2018  03/29/2017: 193/56/63/119 On atorvastatin 10 mg daily  - last eye exam was in 12/2021: No DR reportedly, + Cataract, no DR. On Ocuvite. Cataract sx 04 and 04/2022. - no numbness and tingling in her feet.  She has a history of endometrial cancer and had hysterectomy.  She was laid off from her job in 06/2019.  ROS: + see HPI + blurry vision,  I reviewed pt's medications, allergies, PMH, social hx, family hx, and changes were documented in the history of present illness. Otherwise, unchanged from my initial visit note.  Past Medical History:  Diagnosis Date   Blood transfusion without reported diagnosis at birth    Cancer [facility]) 12/2016   ENDOMETRIAL CANCER   Diabetes mellitus without complication (  [place])    TYPE 2   Genetic testing 04/07/2017   Angela Mays underwent genetic counseling and testing for hereditary cancer syndromes on 03/24/2017. There were no pathogenic mutations identified in any of the 46 genes analyzed by [facility]'s 46-gene Common Hereditary Cancers Panel. Genes analyzed include: APC, ATM, AXIN2, BARD1, BMPR1A, BRCA1, BRCA2, BRIP1, CDH1, CDKN2A, CHEK2, CTNNA1, DICER1, EPCAM, GREM1, HOXB13, KIT, MEN1, MLH1, MSH2, MSH3, MSH6   Leg cramps    and feet OCC   Pneumonia YRS AGO   PONV (postoperative nausea and  vomiting)    NAUSEA   Skin rash 10/21/2016   CONTACT DERMITITIS OCC   Past Surgical History:  Procedure Laterality Date   COLONOSCOPY  done at AGE 72   w/Dr.Mann=normal exam   DILATATION & CURETTAGE/HYSTEROSCOPY WITH MYOSURE N/A 11/02/2016   Procedure: [place];  Surgeon: Molli Posey, MD;  Location: [facility];  Service: Gynecology;  Laterality: N/A;   DILATION AND CURETTAGE OF UTERUS     LYMPH NODE BIOPSY N/A 01/04/2017   Procedure: SENTINEL LYMPH NODE BIOPSY;  Surgeon: Everitt Amber, MD;  Location: [facility];  Service: Gynecology;  Laterality: N/A;   ROBOTIC ASSISTED TOTAL HYSTERECTOMY WITH BILATERAL SALPINGO OOPHERECTOMY Bilateral 01/04/2017   Procedure: XI ROBOTIC ASSISTED TOTAL HYSTERECTOMY WITH BILATERAL SALPINGO OOPHORECTOMY;  Surgeon: Everitt Amber, MD;  Location: [facility];  Service: Gynecology;  Laterality: Bilateral;   TUBAL LIGATION  YRS AGO   Social History   Social History   Marital status: Single    Spouse name: N/A   Number of children: 2   Occupational History   Glass blower/designer    Social History Main Topics   Smoking status: Never Smoker   Smokeless tobacco: Never Used   Alcohol use No   Drug use: No   Current Outpatient Medications on File Prior to Visit  Medication Sig Dispense Refill   atorvastatin (LIPITOR) 10 MG tablet Take 10 mg by mouth daily.     blood glucose meter kit and supplies KIT Dispense based on patient and insurance preference. Use 2x times daily as directed. (FOR ICD-9 250.00, 250.01). 1 each 0   empagliflozin (JARDIANCE) 25 MG TABS tablet Take 1 tablet (25 mg total) by mouth daily. 90 tablet 3   glimepiride (AMARYL) 2 MG tablet Take 1 tablet before dinner 90 tablet 3   lisinopril (ZESTRIL) 2.5 MG tablet      metFORMIN (GLUCOPHAGE-XR) 500 MG 24 hr tablet Take 2 tablets (1,000 mg total) by mouth 2 (two) times daily before a meal. 360 tablet 3   Current Facility-Administered Medications on File Prior to Visit   Medication Dose Route Frequency Provider Last Rate Last Admin   0.9 %  sodium chloride infusion  500 mL Intravenous Once Nandigam, Venia Minks, MD       Allergies  Allergen Reactions   Flagyl [Metronidazole] Rash   Family History  Problem Relation Age of Onset   Heart Problems Father    Cervical cancer Sister 11       full sister   High blood pressure Son    High blood pressure Son    Breast cancer Sister 30       maternal half sister   Melanoma Sister    Bone cancer Mother 36       d.69   Stomach cancer Maternal Grandfather 54       d.60s   Colon cancer Paternal Grandmother 7       d.84   Kidney cancer  Paternal Grandfather 7       d.58   PE: BP 120/72 (BP Location: Right Arm, Patient Position: Sitting, Cuff Size: Normal)    Pulse 68    Ht _0  (1.626 m)    Wt 192 lb 12.8 oz (87.5 kg)    SpO2 97%    BMI 33.09 kg/m  Wt Readings from Last 3 Encounters:  01/13/22 192 lb 12.8 oz (87.5 kg)  09/08/21 192 lb 12.8 oz (87.5 kg)  02/13/21 193 lb 6.4 oz (87.7 kg)   Constitutional: overweight, in NAD Eyes: PERRLA, EOMI, no exophthalmos ENT: moist mucous membranes, no thyromegaly, no cervical lymphadenopathy Cardiovascular: RRR, No MRG Respiratory: CTA B Musculoskeletal: no deformities, strength intact in all 4 Skin: moist, warm, no rashes Neurological: no tremor with outstretched hands, DTR normal in all 4  ASSESSMENT: 1. DM2, non-insulin-dependent, uncontrolled, without long term complications, but with hyperglycemia  2. Obesity class 2 BMI Classification: < 18.5 underweight  18.5-24.9 normal weight  25.0-29.9 overweight  30.0-34.9 class I obesity  35.0-39.9 class II obesity  ? 40.0 class III obesity   3. HL  PLAN:  1. Patient with history of uncontrolled type 2 diabetes, with significant improvement in her diabetes control after starting to improve her diet, reducing sweet drinks, and starting Jardiance.  HbA1c improved to 6.4%, however, at last visit, this was  higher, at 6.9%.  At that time, sugars were at goal fasting and at bedtime but they were higher after meals, consistent with her eating out for lunch or dinner.  She was not usually increasing the dose of glimepiride before these meals and I advised her to start doing so.  I also advised him to try to increase the metformin dose to 1000 mg twice a day.  Upon questioning, she was taking Jardiance in the evening and I advised her to move it before the first meal of the day. -At today's visit, we reviewed her detailed sugar log.  Blood sugars appear to be at or slightly above target, but the sugars later in the day appears to be improved since last visit.  I believe that this is likely caused by moving Jardiance before the first meal of the day.  She is taking glimepiride before after dinner and I strongly advised her to take it 15 to 30 minutes before dinner.  I also advised her to take the higher dose, 4 mg, if she eats out or has a larger dinner.  She has not been taking this dose recently.  I also advised her to try to stop sodas and juice, which she still occasionally drinks.  Otherwise, I do not feel we need to change her regimen for now. - I suggested to:  Patient Instructions  Please continue: - Metformin XR 1000 mg 2x a day - Glimepiride 2-4 mg before a larger meal or if eating out - Jardiance 25 mg before first meal of the day   Please return in 4 months with your sugar log.   - we checked her HbA1c: 6.8% (slightly lower) - advised to check sugars at different times of the day - 1x a day, rotating check times - advised for yearly eye exams >> she is UTD - return to clinic in 4 months  2. Obesity class 2 -We discussed in the past about eliminating sweet tea and juice >> she only occasionally drinks this now -We will continue Jardiance which should also help with weight loss -She gained 5 pounds before the last 2  visits combined.  Weight is stable at this visit.  3. HL -Reviewed latest  lipid panel from 09/07/2021: LDL slightly higher than target of less than 70:152/72/51/87 -She continues atorvastatin 10 mg daily without side effects  Philemon Kingdom, MD PhD [facility] Endocrinology

## 2022-01-13 NOTE — Patient Instructions (Addendum)
Please continue: - Metformin XR 1000 mg 2x a day - Glimepiride 2-4 mg 15-30 min before a larger meal or if eating out - Jardiance 25 mg before first meal of the day   Please return in 4 months with your sugar log.

## 2022-02-08 ENCOUNTER — Other Ambulatory Visit: Payer: Self-pay | Admitting: Internal Medicine

## 2022-02-10 DIAGNOSIS — Z20822 Contact with and (suspected) exposure to covid-19: Secondary | ICD-10-CM | POA: Diagnosis not present

## 2022-03-29 DIAGNOSIS — Z20822 Contact with and (suspected) exposure to covid-19: Secondary | ICD-10-CM | POA: Diagnosis not present

## 2022-04-09 DIAGNOSIS — R059 Cough, unspecified: Secondary | ICD-10-CM | POA: Diagnosis not present

## 2022-04-09 DIAGNOSIS — R051 Acute cough: Secondary | ICD-10-CM | POA: Diagnosis not present

## 2022-04-09 DIAGNOSIS — H2512 Age-related nuclear cataract, left eye: Secondary | ICD-10-CM | POA: Diagnosis not present

## 2022-04-09 DIAGNOSIS — H2511 Age-related nuclear cataract, right eye: Secondary | ICD-10-CM | POA: Diagnosis not present

## 2022-04-09 DIAGNOSIS — Z20822 Contact with and (suspected) exposure to covid-19: Secondary | ICD-10-CM | POA: Diagnosis not present

## 2022-04-22 DIAGNOSIS — Z20822 Contact with and (suspected) exposure to covid-19: Secondary | ICD-10-CM | POA: Diagnosis not present

## 2022-04-25 DIAGNOSIS — Z20822 Contact with and (suspected) exposure to covid-19: Secondary | ICD-10-CM | POA: Diagnosis not present

## 2022-05-07 DIAGNOSIS — H2511 Age-related nuclear cataract, right eye: Secondary | ICD-10-CM | POA: Diagnosis not present

## 2022-05-18 ENCOUNTER — Encounter: Payer: Self-pay | Admitting: Internal Medicine

## 2022-05-18 ENCOUNTER — Ambulatory Visit (INDEPENDENT_AMBULATORY_CARE_PROVIDER_SITE_OTHER): Payer: Medicare Other | Admitting: Internal Medicine

## 2022-05-18 VITALS — BP 112/78 | HR 77 | Ht 64.0 in | Wt 194.0 lb

## 2022-05-18 DIAGNOSIS — Z6835 Body mass index (BMI) 35.0-35.9, adult: Secondary | ICD-10-CM

## 2022-05-18 DIAGNOSIS — E785 Hyperlipidemia, unspecified: Secondary | ICD-10-CM

## 2022-05-18 DIAGNOSIS — E66812 Obesity, class 2: Secondary | ICD-10-CM

## 2022-05-18 DIAGNOSIS — E1165 Type 2 diabetes mellitus with hyperglycemia: Secondary | ICD-10-CM

## 2022-05-18 LAB — POCT GLYCOSYLATED HEMOGLOBIN (HGB A1C): Hemoglobin A1C: 7.5 % — AB (ref 4.0–5.6)

## 2022-05-18 MED ORDER — RYBELSUS 7 MG PO TABS: 7.0000 mg | ORAL_TABLET | Freq: Every day | ORAL | 3 refills | Status: DC

## 2022-05-18 NOTE — Progress Notes (Signed)
- Patient ID: Angela Mays, female   DOB: 1951-03-15, 71 y.o.   MRN: [id number]   HPI: Angela Mays is a 71 y.o.-year-old female, returning for f/u for DM2, dx in 09/2014, non-insulin-dependent, uncontrolled, without long term complications. Last visit 4 months ago.  Interim history: No increased urination, nausea, chest pain.  Her blurry vision resolved after cataract surgeries. She has been forgetting to take the Glimepiride before dinner and also snacking more (pork rinds) at night.  Reviewed HbA1c levels: Lab Results  Component Value Date   HGBA1C 6.8 (A) 01/13/2022   HGBA1C 6.9 (A) 09/08/2021   HGBA1C 6.4 (A) 02/13/2021  11/07/2019: HbA1c: 6.9%  Pt is on: - Metformin XR 1500 >> 1000 mg twice a day - Jardiance 25 mg before first meal of the day (on terconazole) - Glimepiride 2 >> 2-4 mg before  dinner (she takes it after the meals)  Pt is checking sugars  times a day: - am  78, 95-118, 125 >> 78-125 >> 89-127 >> 97-144, 155 >> 98-143 - 2h after b'fast: 127-158 >> 127-172 >> 174 >> 151-167 >> 132-187 - before lunch: 92-132, 164 >> 115-129 >> 110-139, 155 >> 103-154 - 2h after lunch: 152-181, 193 >> 176-216 >> 78, 143-182 >> 159-197, 227 - before dinner: 113-156 >> 80-169 >> 76, 89-149, 153 >> 82-158  - 2h after dinner: 159-192 >> 167-197 >> 186-208 >> 157-195 >> 170-227 - bedtime: n/c >> 153-167 >> 70-154 >> 124 >> 114-263 - nighttime: 130 >> n/c >> 95-132 >> n/c >> 99-111 >> 84-139 >> 84-172 Lowest: 78 >> 80 >> 76 >> 82 Highest: 192 >> 197 >> 208 >> 195 >> 263  Glucometer: One Touch ultra mini  Pt's meals are: - Breakfast: oatmeal or PB sandwich or bacon and eggs or cereals or skips during the week - Lunch: sandwich or spaghetti or fast foods - Dinner: largest - meat + veggies + starch - Snacks: 2-3: less chocolate, potato chips,  M&M She rarely has sweet drinks.  -No CKD last BUN/creatinine:  09/07/2021: 15/0.75, Glu 112 09/03/2020: 15/0.73, glucose 111 Lab Results   Component Value Date   BUN 23 07/03/2018   BUN 19 03/29/2017   CREATININE 0.71 07/03/2018   CREATININE 0.87 03/29/2017  03/29/2017: ACR undet. She is not on ACE inhibitor/ARB.  -+ HL; last set of lipids: 09/07/2021: 152/72/51/87 09/03/2020: 146/63/50/79 11/07/2019: 162/58/57/94 Lab Results  Component Value Date   CHOL 200 07/03/2018   HDL 62.80 07/03/2018   LDLCALC 120 (H) 07/03/2018   TRIG 84.0 07/03/2018   CHOLHDL 3 07/03/2018  03/29/2017: 193/56/63/119 On atorvastatin 10 mg daily  - last eye exam was in 12/2021: No DR reportedly, + Cataract, no DR. On Ocuvite. Cataract sx 04 and 04/2022.  - no numbness and tingling in her feet.  Last foot exam September 07, 2021.  She has a history of endometrial cancer and had hysterectomy. She does not have a ho pancreatitis or FH of MTC. She was laid off from her job in 06/2019.  ROS: + see HPI + blurry vision,  I reviewed pt's medications, allergies, PMH, social hx, family hx, and changes were documented in the history of present illness. Otherwise, unchanged from my initial visit note.  Past Medical History:  Diagnosis Date   Blood transfusion without reported diagnosis at birth    Cancer [facility]) 12/2016   ENDOMETRIAL CANCER   Diabetes mellitus without complication ([place])    TYPE 2   Genetic testing 04/07/2017  Angela Mays underwent genetic counseling and testing for hereditary cancer syndromes on 03/24/2017. There were no pathogenic mutations identified in any of the 46 genes analyzed by [facility]'s 46-gene Common Hereditary Cancers Panel. Genes analyzed include: APC, ATM, AXIN2, BARD1, BMPR1A, BRCA1, BRCA2, BRIP1, CDH1, CDKN2A, CHEK2, CTNNA1, DICER1, EPCAM, GREM1, HOXB13, KIT, MEN1, MLH1, MSH2, MSH3, MSH6   Leg cramps    and feet OCC   Pneumonia YRS AGO   PONV (postoperative nausea and vomiting)    NAUSEA   Skin rash 10/21/2016   CONTACT DERMITITIS OCC   Past Surgical History:  Procedure Laterality Date   COLONOSCOPY  done  at AGE 10   w/Angela Mays=normal exam   DILATATION & CURETTAGE/HYSTEROSCOPY WITH MYOSURE N/A 11/02/2016   Procedure: [place];  Surgeon: Angela Posey, MD;  Location: [facility];  Service: Gynecology;  Laterality: N/A;   DILATION AND CURETTAGE OF UTERUS     LYMPH NODE BIOPSY N/A 01/04/2017   Procedure: SENTINEL LYMPH NODE BIOPSY;  Surgeon: Angela Amber, MD;  Location: [facility];  Service: Gynecology;  Laterality: N/A;   ROBOTIC ASSISTED TOTAL HYSTERECTOMY WITH BILATERAL SALPINGO OOPHERECTOMY Bilateral 01/04/2017   Procedure: XI ROBOTIC ASSISTED TOTAL HYSTERECTOMY WITH BILATERAL SALPINGO OOPHORECTOMY;  Surgeon: Angela Amber, MD;  Location: [facility];  Service: Gynecology;  Laterality: Bilateral;   TUBAL LIGATION  YRS AGO   Social History   Social History   Marital status: Single    Spouse name: N/A   Number of children: 2   Occupational History   Glass blower/designer    Social History Main Topics   Smoking status: Never Smoker   Smokeless tobacco: Never Used   Alcohol use No   Drug use: No   Current Outpatient Medications on File Prior to Visit  Medication Sig Dispense Refill   atorvastatin (LIPITOR) 10 MG tablet Take 10 mg by mouth daily.     blood glucose meter kit and supplies KIT Dispense based on patient and insurance preference. Use 2x times daily as directed. (FOR ICD-9 250.00, 250.01). 1 each 0   glimepiride (AMARYL) 2 MG tablet TAKE 1 TABLET BEFORE DINNER 90 tablet 3   JARDIANCE 25 MG TABS tablet TAKE 1 TABLET DAILY 90 tablet 3   lisinopril (ZESTRIL) 2.5 MG tablet      metFORMIN (GLUCOPHAGE-XR) 500 MG 24 hr tablet Take 2 tablets (1,000 mg total) by mouth 2 (two) times daily before a meal. 360 tablet 3   Current Facility-Administered Medications on File Prior to Visit  Medication Dose Route Frequency Provider Last Rate Last Admin   0.9 %  sodium chloride infusion  500 mL Intravenous Once Angela Mays, Angela Minks, MD       Allergies  Allergen Reactions    Flagyl [Metronidazole] Rash   Family History  Problem Relation Age of Onset   Heart Problems Father    Cervical cancer Sister 60       full sister   High blood pressure Son    High blood pressure Son    Breast cancer Sister 47       maternal half sister   Melanoma Sister    Bone cancer Mother 57       d.69   Stomach cancer Maternal Grandfather 27       d.60s   Colon cancer Paternal Grandmother 75       d.33   Kidney cancer Paternal Grandfather 46       d.58   PE: BP 112/78 (BP Location: Left Arm,  Patient Position: Sitting, Cuff Size: Normal)   Pulse 77   Ht '5\' 4"'  (1.626 m)   Wt 194 lb (88 kg)   SpO2 97%   BMI 33.30 kg/m  Wt Readings from Last 3 Encounters:  05/18/22 194 lb (88 kg)  01/13/22 192 lb 12.8 oz (87.5 kg)  09/08/21 192 lb 12.8 oz (87.5 kg)   Constitutional: overweight, in NAD Eyes: PERRLA, EOMI, no exophthalmos ENT: moist mucous membranes, no thyromegaly, no cervical lymphadenopathy Cardiovascular: RRR, No MRG Respiratory: CTA B Musculoskeletal: no deformities, strength intact in all 4 Skin: moist, warm, no rashes Neurological: no tremor with outstretched hands, DTR normal in all 4  ASSESSMENT: 1. DM2, non-insulin-dependent, uncontrolled, without long term complications, but with hyperglycemia  2. Obesity class 2 BMI Classification: < 18.5 underweight  18.5-24.9 normal weight  25.0-29.9 overweight  30.0-34.9 class I obesity  35.0-39.9 class II obesity  ? 40.0 class III obesity   3. HL  PLAN:  1. Patient with history of uncontrolled type 2 diabetes, with significant improvement in her diabetes control after starting to improve her diet, reducing sweet drinks, and starting Jardiance.  HbA1c improved to 6.4%, but then increased to 6.9%.  At last visit, it was slightly lower, at 6.8%.  At that time, sugars were at or slightly above target in the morning but they were improving later in the day.  This was likely caused by moving Jardiance before the  first meal of the day.  She was taking in the prior before dinner and I advised her to move it earlier, 15 to 30 minutes before this meal.  I also advised her to take the higher dose, 4 mg, if she was eating out or having a large dinner.  We also again discussed about stoppages.  Otherwise, we did not change her regimen. -At today's visit, sugars are higher, in the morning, and also after occasional meals.  She mentions that she does forget to take glimepiride before dinner and she occasionally takes it later.  However, after some of her dinners eaten out, her sugars spiked to high 200s.  Also, she is snacking more at night, on pork rinds, and we discussed that this is a terrible snack from the cholesterol point of view but also from the diabetes point of view since it increases her insulin resistance.  Indeed, HbA1c today is higher.  I strongly advised her to stop the pork rinds.  In the meantime, I did suggest to add Rybelsus (she is opposed to injectables).  Discussed about possible side effects. - I suggested to:  Patient Instructions  Please continue: - Metformin XR 1000 mg 2x a day - Glimepiride 2-4 mg before a larger meal or if eating out - Jardiance 25 mg before first meal of the day   Please start: - Rybelsus 3.5 mg before b'fast x 1 week, then increase to 7 mg daily  Please return in 4 months with your sugar log.   - we checked her HbA1c: 7.5% (higher) - advised to check sugars at different times of the day - 1x a day, rotating check times - advised for yearly eye exams >> she is UTD - return to clinic in 4 months  2. Obesity class 2 -We discussed in the past about eliminating sweet tea and juice-she only occasionally drinks he is now -We will continue Jardiance which should also help with weight loss -Weight was stable at last visit and gained 2 pounds since then  3. HL - Reviewed  latest lipid panel from 09/07/2021: LDL slightly higher than target of less than 70, the rest of the  fractions at goal:152/72/51/87 - Continues atorvastatin 10 mg daily without side effects.  Philemon Kingdom, MD PhD [facility] Endocrinology

## 2022-05-18 NOTE — Patient Instructions (Addendum)
Please continue: - Metformin XR 1000 mg 2x a day - Glimepiride 2-4 mg before a larger meal or if eating out - Jardiance 25 mg before first meal of the day   Please start: - Rybelsus 3.5 mg before b'fast x 1 week, then increase to 7 mg daily  Please return in 4 months with your sugar log.

## 2022-06-07 ENCOUNTER — Telehealth: Payer: Self-pay

## 2022-06-07 NOTE — Telephone Encounter (Signed)
Pt called regarding rx for Rybelsus that was [facility]. Pharmacy had questions regarding rx.

## 2022-09-14 ENCOUNTER — Ambulatory Visit (INDEPENDENT_AMBULATORY_CARE_PROVIDER_SITE_OTHER): Payer: Medicare Other | Admitting: Family Medicine

## 2022-09-14 ENCOUNTER — Encounter: Payer: Self-pay | Admitting: Family Medicine

## 2022-09-14 ENCOUNTER — Ambulatory Visit: Payer: Medicare Other | Admitting: Family Medicine

## 2022-09-14 VITALS — BP 130/80 | HR 65 | Temp 97.2°F | Ht 64.0 in | Wt 188.2 lb

## 2022-09-14 DIAGNOSIS — E1165 Type 2 diabetes mellitus with hyperglycemia: Secondary | ICD-10-CM | POA: Diagnosis not present

## 2022-09-14 DIAGNOSIS — C541 Malignant neoplasm of endometrium: Secondary | ICD-10-CM

## 2022-09-14 DIAGNOSIS — Z23 Encounter for immunization: Secondary | ICD-10-CM

## 2022-09-14 DIAGNOSIS — Z1159 Encounter for screening for other viral diseases: Secondary | ICD-10-CM

## 2022-09-14 LAB — CBC WITH DIFFERENTIAL/PLATELET
Basophils Absolute: 0 10*3/uL (ref 0.0–0.1)
Basophils Relative: 0.5 % (ref 0.0–3.0)
Eosinophils Absolute: 0.1 10*3/uL (ref 0.0–0.7)
Eosinophils Relative: 1.3 % (ref 0.0–5.0)
HCT: 39.5 % (ref 36.0–46.0)
Hemoglobin: 12.6 g/dL (ref 12.0–15.0)
Lymphocytes Relative: 23.4 % (ref 12.0–46.0)
Lymphs Abs: 2.1 10*3/uL (ref 0.7–4.0)
MCHC: 31.9 g/dL (ref 30.0–36.0)
MCV: 78 fl (ref 78.0–100.0)
Monocytes Absolute: 0.4 10*3/uL (ref 0.1–1.0)
Monocytes Relative: 4.3 % (ref 3.0–12.0)
Neutro Abs: 6.2 10*3/uL (ref 1.4–7.7)
Neutrophils Relative %: 70.5 % (ref 43.0–77.0)
Platelets: 162 10*3/uL (ref 150.0–400.0)
RBC: 5.06 Mil/uL (ref 3.87–5.11)
RDW: 16.8 % — ABNORMAL HIGH (ref 11.5–15.5)
WBC: 8.9 10*3/uL (ref 4.0–10.5)

## 2022-09-14 LAB — COMPREHENSIVE METABOLIC PANEL
ALT: 13 U/L (ref 0–35)
AST: 14 U/L (ref 0–37)
Albumin: 3.8 g/dL (ref 3.5–5.2)
Alkaline Phosphatase: 127 U/L — ABNORMAL HIGH (ref 39–117)
BUN: 14 mg/dL (ref 6–23)
CO2: 29 mEq/L (ref 19–32)
Calcium: 9.5 mg/dL (ref 8.4–10.5)
Chloride: 103 mEq/L (ref 96–112)
Creatinine, Ser: 0.8 mg/dL (ref 0.40–1.20)
GFR: 73.51 mL/min (ref >60.00)
Glucose, Bld: 167 mg/dL — ABNORMAL HIGH (ref 70–99)
Potassium: 4.3 mEq/L (ref 3.5–5.1)
Sodium: 138 mEq/L (ref 135–145)
Total Bilirubin: 0.5 mg/dL (ref 0.2–1.2)
Total Protein: 7.2 g/dL (ref 6.0–8.3)

## 2022-09-14 LAB — MICROALBUMIN / CREATININE URINE RATIO
Creatinine,U: 76 mg/dL
Microalb Creat Ratio: 0.9 mg/g (ref 0.0–30.0)
Microalb, Ur: <0.7 mg/dL (ref 0.0–1.9)

## 2022-09-14 LAB — LIPID PANEL
Cholesterol: 151 mg/dL (ref 0–200)
HDL: 53.1 mg/dL (ref >39.00)
LDL Cholesterol: 83 mg/dL (ref 0–99)
NonHDL: 98.23
Total CHOL/HDL Ratio: 3
Triglycerides: 74 mg/dL (ref 0.0–149.0)
VLDL: 14.8 mg/dL (ref 0.0–40.0)

## 2022-09-14 LAB — TSH: TSH: 2.4 u[IU]/mL (ref 0.35–5.50)

## 2022-09-14 LAB — VITAMIN B12: Vitamin B-12: 1492 pg/mL — ABNORMAL HIGH (ref 211–911)

## 2022-09-14 LAB — HEMOGLOBIN A1C: Hgb A1c MFr Bld: 7.7 % — ABNORMAL HIGH (ref 4.6–6.5)

## 2022-09-14 NOTE — Progress Notes (Signed)
New Patient Office Visit  Subjective:  Patient ID: Angela Mays, female    DOB: August 10, 1951  Age: 71 y.o. MRN: [id number]  CC:  Chief Complaint  Patient presents with   Establish Care    Need new pcp Fasting for blood work     HPI Angela Mays presents for new pt.  Doc from [facility] left  DM type 2-seeing Endo. - on metformin xr 1065m bid, jardiance 235mand glimepiride,Rybelsus 52m55mever filled d/t insurance.  Sees Endo 10/5..  Marland Kitchenn atorvastatin 39m61md lisinopril 2.5mg.65m1C 7.5 in May.  Occ checks sugars 120-180's.  Pneumovax 2017   H/o endometria Ca-had hyst. - hasn't seen Drl HollaEcuadoryrs.  DXA 06/04/19.   Past Medical History:  Diagnosis Date   Blood transfusion without reported diagnosis at birth    mom was Rh neg and pt +   Cancer (HCC) [id number]   ENDOMETRIAL CANCER   Cataract    [facility] Neal Austin Milesabetes mellitus [facility]   Genetic testing 04/07/2017   [name] underwent genetic counseling and testing for hereditary cancer syndromes on 03/24/2017. There were no pathogenic mutations identified in any of the 46 genes analyzed by [facility]'s 46-gene Common Hereditary Cancers Panel. Genes analyzed include: APC, ATM, AXIN2, BARD1, BMPR1A, BRCA1, BRCA2, BRIP1, CDH1, CDKN2A, CHEK2, CTNNA1, DICER1, EPCAM, GREM1, HOXB13, KIT, MEN1, MLH1, MSH2, MSH3, MSH6   Leg cramps    and feet OCC   Pneumonia YRS AGO   PONV (postoperative nausea and vomiting)    NAUSEA   Skin rash 10/21/2016   CONTACT DERMITITIS OCC    Past Surgical History:  Procedure Laterality Date   ABDOMINAL HYSTERECTOMY  01/04/2017   Dr Emma Everitt AmberLONOSCOPY  done at AGE 54   77Dr.Mann=normal exam   DILATATION & CURETTAGE/HYSTEROSCOPY WITH MYOSURE N/A 11/02/2016   Procedure: DILATCalergeon: RichaMolli Posey  [place] [facility]  Service: Gynecology;  Laterality: N/A;   DILATION AND CURETTAGE OF UTERUS     EYE SURGERY  04/09/22 (L) &  05/07/22 (R)   Dr TimotDarleen CrockerMPH NODE BIOPSY N/A 01/04/2017   Procedure: SENTINEL LYMPH NODE BIOPSY;  Surgeon: Emma Everitt Amber  Location[facility];  Service: Gynecology;  Laterality: N/A;   ROBOTIC ASSISTED TOTAL HYSTERECTOMY WITH BILATERAL SALPINGO OOPHERECTOMY Bilateral 01/04/2017   Procedure: XI ROBOTIC ASSISTED TOTAL HYSTERECTOMY WITH BILATERAL SALPINGO OOPHORECTOMY;  Surgeon: Emma Everitt Amber  Location: [facility];  Service: Gynecology;  Laterality: Bilateral;   TUBAL LIGATION  YRS AGO    Family History  Problem Relation Age of Onset   Heart Problems Father    COPD Father    Heart disease Father    Cervical cancer Sister 24   58  full sister   Cancer Sister    Kidney disease Sister    High blood pressure Son    Alcohol abuse Son    Drug abuse Son    High blood pressure Son    Alcohol abuse Son    Anxiety disorder Son    Depression Son    Drug abuse Son    Breast cancer Sister 73   69  maternal half sister   Melanoma Sister    Cancer Sister    Hearing loss Sister    Bone cancer Mother 68   55  d.69   Cancer Mother  COPD Mother    Early death Maternal Grandmother    Stomach cancer Maternal Grandfather 67       d.60s   Cancer Maternal Grandfather    Colon cancer Paternal Grandmother 33       d.84   Cancer Paternal Grandmother    Diabetes Paternal Grandmother    Stroke Paternal Grandmother    Kidney cancer Paternal Grandfather 84       d.58   Cancer Paternal Grandfather    Varicose Veins Maternal Aunt     Social History   Socioeconomic History   Marital status: Widowed    Spouse name: Not on file   Number of children: 2   Years of education: Not on file   Highest education level: Not on file  Occupational History   Not on file  Tobacco Use   Smoking status: Never   Smokeless tobacco: Never  Vaping Use   Vaping Use: Never used  Substance and Sexual Activity   Alcohol use: No   Drug use: No   Sexual activity: Not Currently    Birth  control/protection: Post-menopausal, Surgical  Other Topics Concern   Not on file  Social History Narrative   13 grands, 3 greats.   Lives w/son(disabled).   Social Determinants of Health   Financial Resource Strain: Not on file  Food Insecurity: Not on file  Transportation Needs: Not on file  Physical Activity: Not on file  Stress: Not on file  Social Connections: Not on file  Intimate Partner Violence: Not on file    ROS  ROS: Gen: no fever, chills  Skin: no rash, itching ENT: no ear pain, ear drainage, nasal congestion, rhinorrhea, sinus pressure, sore throat Eyes: no blurry vision, double vision Resp: no cough, wheeze,SOB CV: no CP, palpitations, LE edema,  GI: no heartburn, n/v/d/c, abd pain GU: no dysuria, urgency, frequency, hematuria.  Occ yeast infections MSK: no joint pain, myalgias, back pain.  Occ leg cramps.  Neuro: no dizziness, headache, weakness, vertigo Psych: no depression, anxiety, insomnia, SI  Going to have teeth pulled.     Sister dying cancer  Objective:   Today's Vitals: BP 130/80   Pulse 65   Temp (!) 97.2 F (36.2 C) (Temporal)   Ht _0  (1.626 m)   Wt 188 lb 4 oz (85.4 kg)   SpO2 95%   BMI 32.31 kg/m   Physical Exam  Gen: WDWN NAD WF HEENT: NCAT, conjunctiva not injected, sclera nonicteric TM WNL B, OP moist, no exudates  NECK:  supple, no thyromegaly, no nodes, no carotid bruits CARDIAC: RRR, S1S2+, no murmur. DP 2+B LUNGS: CTAB. No wheezes ABDOMEN:  BS+, soft, NTND, No HSM, no masses EXT:  no edema MSK: no gross abnormalities.  NEURO: A&O x3.  CN II-XII intact.  PSYCH: normal mood. Good eye contact   Assessment & Plan:   Problem List Items Addressed This Visit       Endocrine   Type 2 diabetes mellitus with hyperglycemia, without long-term current use of insulin (HCC) - Primary   Relevant Medications   ASPIRIN EC LOW DOSE PO   Semaglutide (RYBELSUS) 7 MG TABS   Other Relevant Orders   Comprehensive metabolic panel    Hemoglobin A1c   Lipid panel   TSH   CBC with Differential/Platelet   Microalbumin / creatinine urine ratio   Vitamin B12     Genitourinary   Endometrial cancer (HCC)   Relevant Medications   ASPIRIN EC LOW DOSE PO  Other Visit Diagnoses     Encounter for hepatitis C screening test for low risk patient       Relevant Orders   Hepatitis C antibody      DM type 2 w/hyperglycemia-managed by endo.  Has appt next wk.  Check cbc,cmp,tsh,lipids,A1C,B12,urine microalb/creat ratio.   Endometrial ca-h/o.  Needs to sch w/gyn AWV-advised to sch and get copies records for immunizaions.  Outpatient Encounter Medications as of 09/14/2022  Medication Sig   ASPIRIN EC LOW DOSE PO Take 81 mg by mouth daily.   atorvastatin (LIPITOR) 10 MG tablet Take 10 mg by mouth daily.   blood glucose meter kit and supplies KIT Dispense based on patient and insurance preference. Use 2x times daily as directed. (FOR ICD-9 250.00, 250.01).   Cholecalciferol (D3) 50 MCG (2000 UT) TABS Take 1 tablet by mouth daily.   cyanocobalamin (VITAMIN B12) 1000 MCG tablet Take 1,000 mcg by mouth daily.   glimepiride (AMARYL) 2 MG tablet TAKE 1 TABLET BEFORE DINNER   JARDIANCE 25 MG TABS tablet TAKE 1 TABLET DAILY   lisinopril (ZESTRIL) 2.5 MG tablet    metFORMIN (GLUCOPHAGE-XR) 500 MG 24 hr tablet Take 2 tablets (1,000 mg total) by mouth 2 (two) times daily before a meal.   nystatin-triamcinolone (MYCOLOG II) cream APPLY CREAM TOPICALLY TO AFFECTED AREA(S) TWICE DAILY (IN THE MORNING AND IN THE EVENING)   Semaglutide (RYBELSUS) 7 MG TABS Take 1 tablet by mouth daily.   [DISCONTINUED] Semaglutide (RYBELSUS) 7 MG TABS Take 7 mg by mouth daily.   Facility-Administered Encounter Medications as of 09/14/2022  Medication   0.9 %  sodium chloride infusion    Follow-up: Return in about 1 year (around 09/15/2023) for annual me in 1 yr.   AWV soon w/whomever(Tina/Katie). Wellington Hampshire, MD

## 2022-09-14 NOTE — Patient Instructions (Addendum)
Welcome to [facility]! It was a pleasure meeting you today.  As discussed, Please schedule a 12 month follow up visit today.  Get appt w/Dr. Matthew Saras  PLEASE NOTE:  If you had any LAB tests please let us know if you have not heard back within a few days. You may see your results on MyChart before we have a chance to review them but we will give you a call once they are reviewed by [place]. If we ordered any REFERRALS today, please let us know if you have not heard from their office within the next week.  Let us know through MyChart if you are needing REFILLS, or have your pharmacy send [place] the request. You can also use MyChart to communicate with me or any office staff.  Please try these tips to maintain a healthy lifestyle:  Eat most of your calories during the day when you are active. Eliminate processed foods including packaged sweets (pies, cakes, cookies), reduce intake of potatoes, white bread, white pasta, and white rice. Look for whole grain options, oat flour or almond flour.  Each meal should contain half fruits/vegetables, one quarter protein, and one quarter carbs (no bigger than a computer mouse).  Cut down on sweet beverages. This includes juice, soda, and sweet tea. Also watch fruit intake, though this is a healthier sweet option, it still contains natural sugar! Limit to 3 servings daily.  Drink at least 1 glass of water with each meal and aim for at least 8 glasses per day  Exercise at least 150 minutes every week.

## 2022-09-15 LAB — HEPATITIS C ANTIBODY: Hepatitis C Ab: NONREACTIVE

## 2022-09-15 NOTE — Progress Notes (Signed)
Most labs ok. A1C too high-has follow up w/endocrine Alk phos slightly elevated-reck 1 month-lft and vit d B12 high-if taking B12, decrease dose or stop

## 2022-09-16 ENCOUNTER — Encounter: Payer: Self-pay | Admitting: *Deleted

## 2022-09-16 ENCOUNTER — Other Ambulatory Visit: Payer: Self-pay | Admitting: *Deleted

## 2022-09-16 DIAGNOSIS — E559 Vitamin D deficiency, unspecified: Secondary | ICD-10-CM

## 2022-09-16 DIAGNOSIS — R748 Abnormal levels of other serum enzymes: Secondary | ICD-10-CM

## 2022-09-16 DIAGNOSIS — R7989 Other specified abnormal findings of blood chemistry: Secondary | ICD-10-CM

## 2022-09-23 ENCOUNTER — Ambulatory Visit: Payer: Medicare Other | Admitting: Internal Medicine

## 2022-10-19 ENCOUNTER — Telehealth: Payer: Self-pay | Admitting: Family Medicine

## 2022-10-19 ENCOUNTER — Other Ambulatory Visit: Payer: Self-pay | Admitting: *Deleted

## 2022-10-19 MED ORDER — ATORVASTATIN CALCIUM 10 MG PO TABS: 10.0000 mg | ORAL_TABLET | Freq: Every day | ORAL | 3 refills | Status: DC

## 2022-10-19 NOTE — Telephone Encounter (Signed)
Patient requests the following RX that was previously prescribed by prior PCP:   90 day supply of atorvastatin (LIPITOR) 10 MG tablet   Be sent to:  [facility], [place] Phone: [phone]  Fax: [phone]

## 2022-10-19 NOTE — Telephone Encounter (Signed)
Rx sent to the pharmacy.

## 2022-11-02 ENCOUNTER — Telehealth: Payer: Self-pay | Admitting: Family Medicine

## 2022-11-02 ENCOUNTER — Other Ambulatory Visit: Payer: Self-pay | Admitting: *Deleted

## 2022-11-02 MED ORDER — LISINOPRIL 2.5 MG PO TABS: 2.5000 mg | ORAL_TABLET | Freq: Every day | ORAL | 3 refills | Status: DC

## 2022-11-02 NOTE — Telephone Encounter (Signed)
  LAST APPOINTMENT DATE:  09/14/22  NEXT APPOINTMENT DATE: 09/21/23  MEDICATION: lisinopril (ZESTRIL) 2.5 MG tablet   Is the patient out of medication? Has enough until 11/28  [place], [place] [address] [place] [address] Phone: [phone]  Fax: [phone]  Patient requests a 90 day supply be sent in.

## 2022-11-02 NOTE — Telephone Encounter (Signed)
Rx sent to the pharmacy.

## 2022-11-16 ENCOUNTER — Ambulatory Visit (INDEPENDENT_AMBULATORY_CARE_PROVIDER_SITE_OTHER): Payer: Medicare Other

## 2022-11-16 VITALS — Wt 188.0 lb

## 2022-11-16 DIAGNOSIS — Z0000 Encounter for general adult medical examination without abnormal findings: Secondary | ICD-10-CM

## 2022-11-16 NOTE — Patient Instructions (Signed)
[name] , Thank you for taking time to come for your Medicare Wellness Visit. I appreciate your ongoing commitment to your health goals. Please review the following plan we discussed and let me know if I can assist you in the future.   These are the goals we discussed:  Goals      Patient Stated     Lose weight         This is a list of the screening recommended for you and due dates:  Health Maintenance  Topic Date Due   Eye exam for diabetics  Never done   Pneumonia Vaccine (1 - PCV) Never done   DEXA scan (bone density measurement)  Never done   COVID-19 Vaccine (4 - 2023-24 season) 08/20/2022   Complete foot exam   09/07/2022   Colon Cancer Screening  01/23/2023   Hemoglobin A1C  03/15/2023   Mammogram  08/19/2023   Yearly kidney function blood test for diabetes  09/15/2023   Yearly kidney health urinalysis for diabetes  09/15/2023   Medicare Annual Wellness Visit  11/17/2023   Flu Shot  Completed   Hepatitis C Screening: USPSTF Recommendation to screen - Ages 18-79 yo.  Completed   Zoster (Shingles) Vaccine  Completed   HPV Vaccine  Aged Out    Advanced directives: Please bring a copy of your health care power of attorney and living will to the office at your convenience.  Conditions/risks identified: lose a little weight   Next appointment: Follow up in one year for your annual wellness visit    Preventive Care 65 Years and Older, Female Preventive care refers to lifestyle choices and visits with your health care provider that can promote health and wellness. What does preventive care include? A yearly physical exam. This is also called an annual well check. Dental exams once or twice a year. Routine eye exams. Ask your health care provider how often you should have your eyes checked. Personal lifestyle choices, including: Daily care of your teeth and gums. Regular physical activity. Eating a healthy diet. Avoiding tobacco and drug use. Limiting alcohol  use. Practicing safe sex. Taking low-dose aspirin every day. Taking vitamin and mineral supplements as recommended by your health care provider. What happens during an annual well check? The services and screenings done by your health care provider during your annual well check will depend on your age, overall health, lifestyle risk factors, and family history of disease. Counseling  Your health care provider may ask you questions about your: Alcohol use. Tobacco use. Drug use. Emotional well-being. Home and relationship well-being. Sexual activity. Eating habits. History of falls. Memory and ability to understand (cognition). Work and work Statistician. Reproductive health. Screening  You may have the following tests or measurements: Height, weight, and BMI. Blood pressure. Lipid and cholesterol levels. These may be checked every 5 years, or more frequently if you are over 16 years old. Skin check. Lung cancer screening. You may have this screening every year starting at age 11 if you have a 30-pack-year history of smoking and currently smoke or have quit within the past 15 years. Fecal occult blood test (FOBT) of the stool. You may have this test every year starting at age 70. Flexible sigmoidoscopy or colonoscopy. You may have a sigmoidoscopy every 5 years or a colonoscopy every 10 years starting at age 66. Hepatitis C blood test. Hepatitis B blood test. Sexually transmitted disease (STD) testing. Diabetes screening. This is done by checking your blood sugar (glucose)  after you have not eaten for a while (fasting). You may have this done every 1-3 years. Bone density scan. This is done to screen for osteoporosis. You may have this done starting at age 17. Mammogram. This may be done every 1-2 years. Talk to your health care provider about how often you should have regular mammograms. Talk with your health care provider about your test results, treatment options, and if necessary,  the need for more tests. Vaccines  Your health care provider may recommend certain vaccines, such as: Influenza vaccine. This is recommended every year. Tetanus, diphtheria, and acellular pertussis (Tdap, Td) vaccine. You may need a Td booster every 10 years. Zoster vaccine. You may need this after age 21. Pneumococcal 13-valent conjugate (PCV13) vaccine. One dose is recommended after age 39. Pneumococcal polysaccharide (PPSV23) vaccine. One dose is recommended after age 60. Talk to your health care provider about which screenings and vaccines you need and how often you need them. This information is not intended to replace advice given to you by your health care provider. Make sure you discuss any questions you have with your health care provider. Document Released: 01/02/2016 Document Revised: 08/25/2016 Document Reviewed: 10/07/2015 Elsevier Interactive Patient Education  2017 [place] Prevention in the Home Falls can cause injuries. They can happen to people of all ages. There are many things you can do to make your home safe and to help prevent falls. What can I do on the outside of my home? Regularly fix the edges of walkways and driveways and fix any cracks. Remove anything that might make you trip as you walk through a door, such as a raised step or threshold. Trim any bushes or trees on the path to your home. Use bright outdoor lighting. Clear any walking paths of anything that might make someone trip, such as rocks or tools. Regularly check to see if handrails are loose or broken. Make sure that both sides of any steps have handrails. Any raised decks and porches should have guardrails on the edges. Have any leaves, snow, or ice cleared regularly. Use sand or salt on walking paths during winter. Clean up any spills in your garage right away. This includes oil or grease spills. What can I do in the bathroom? Use night lights. Install grab bars by the toilet and in the  tub and shower. Do not use towel bars as grab bars. Use non-skid mats or decals in the tub or shower. If you need to sit down in the shower, use a plastic, non-slip stool. Keep the floor dry. Clean up any water that spills on the floor as soon as it happens. Remove soap buildup in the tub or shower regularly. Attach bath mats securely with double-sided non-slip rug tape. Do not have throw rugs and other things on the floor that can make you trip. What can I do in the bedroom? Use night lights. Make sure that you have a light by your bed that is easy to reach. Do not use any sheets or blankets that are too big for your bed. They should not hang down onto the floor. Have a firm chair that has side arms. You can use this for support while you get dressed. Do not have throw rugs and other things on the floor that can make you trip. What can I do in the kitchen? Clean up any spills right away. Avoid walking on wet floors. Keep items that you use a lot in easy-to-reach places. If  you need to reach something above you, use a strong step stool that has a grab bar. Keep electrical cords out of the way. Do not use floor polish or wax that makes floors slippery. If you must use wax, use non-skid floor wax. Do not have throw rugs and other things on the floor that can make you trip. What can I do with my stairs? Do not leave any items on the stairs. Make sure that there are handrails on both sides of the stairs and use them. Fix handrails that are broken or loose. Make sure that handrails are as long as the stairways. Check any carpeting to make sure that it is firmly attached to the stairs. Fix any carpet that is loose or worn. Avoid having throw rugs at the top or bottom of the stairs. If you do have throw rugs, attach them to the floor with carpet tape. Make sure that you have a light switch at the top of the stairs and the bottom of the stairs. If you do not have them, ask someone to add them for  you. What else can I do to help prevent falls? Wear shoes that: Do not have high heels. Have rubber bottoms. Are comfortable and fit you well. Are closed at the toe. Do not wear sandals. If you use a stepladder: Make sure that it is fully opened. Do not climb a closed stepladder. Make sure that both sides of the stepladder are locked into place. Ask someone to hold it for you, if possible. Clearly mark and make sure that you can see: Any grab bars or handrails. First and last steps. Where the edge of each step is. Use tools that help you move around (mobility aids) if they are needed. These include: Canes. Walkers. Scooters. Crutches. Turn on the lights when you go into a dark area. Replace any light bulbs as soon as they burn out. Set up your furniture so you have a clear path. Avoid moving your furniture around. If any of your floors are uneven, fix them. If there are any pets around you, be aware of where they are. Review your medicines with your doctor. Some medicines can make you feel dizzy. This can increase your chance of falling. Ask your doctor what other things that you can do to help prevent falls. This information is not intended to replace advice given to you by your health care provider. Make sure you discuss any questions you have with your health care provider. Document Released: 10/02/2009 Document Revised: 05/13/2016 Document Reviewed: 01/10/2015 Elsevier Interactive Patient Education  2017 [employer].

## 2022-11-16 NOTE — Progress Notes (Signed)
I connected with  [name] on 11/16/22 by a audio enabled telemedicine application and verified that I am speaking with the correct person using two identifiers.  Patient Location: Home  Provider Location: Office/Clinic  I discussed the limitations of evaluation and management by telemedicine. The patient expressed understanding and agreed to proceed.   Subjective:   Angela Mays is a 71 y.o. female who presents for Medicare Annual (Subsequent) preventive examination.  Review of Systems     Cardiac Risk Factors include: advanced age (>41mn, >>36women);diabetes mellitus;obesity (BMI >30kg/m2)     Objective:    Today's Vitals   11/16/22 1345  Weight: 188 lb (85.3 kg)   Body mass index is 32.27 kg/m.     11/16/2022    1:51 PM 09/01/2018    3:48 PM 02/13/2018    1:52 PM 01/28/2017    2:10 PM 01/04/2017   11:41 AM 01/04/2017   11:32 AM 01/04/2017    5:40 AM  Advanced Directives  Does Patient Have a Medical Advance Directive? Yes Yes Yes Yes  Yes Yes  Type of AParamedicof AGamewellLiving will H[place]Living will H[place]air[place]Living will HBroadlandsLiving will Living will;Healthcare Power of APioneerLiving will HBreckenridgeLiving will  Does patient want to make changes to medical advance directive?     No - Patient declined    Copy of [place] Chart? No - copy requested No - copy requested No - copy requested No - copy requested  No - copy requested No - copy requested    Current Medications (verified) Outpatient Encounter Medications as of 11/16/2022  Medication Sig   ASPIRIN EC LOW DOSE PO Take 81 mg by mouth daily.   atorvastatin (LIPITOR) 10 MG tablet Take 1 tablet (10 mg total) by mouth daily.   blood glucose meter kit and supplies KIT Dispense based on patient and insurance preference. Use 2x times daily as directed. (FOR  ICD-9 250.00, 250.01).   Cholecalciferol (D3) 50 MCG (2000 UT) TABS Take 1 tablet by mouth daily.   glimepiride (AMARYL) 2 MG tablet TAKE 1 TABLET BEFORE DINNER   JARDIANCE 25 MG TABS tablet TAKE 1 TABLET DAILY   lisinopril (ZESTRIL) 2.5 MG tablet Take 1 tablet (2.5 mg total) by mouth daily.   metFORMIN (GLUCOPHAGE-XR) 500 MG 24 hr tablet Take 2 tablets (1,000 mg total) by mouth 2 (two) times daily before a meal.   nystatin-triamcinolone (MYCOLOG II) cream APPLY CREAM TOPICALLY TO AFFECTED AREA(S) TWICE DAILY (IN THE MORNING AND IN THE EVENING) (Patient not taking: Reported on 11/16/2022)   [DISCONTINUED] cyanocobalamin (VITAMIN B12) 1000 MCG tablet Take 1,000 mcg by mouth daily.   [DISCONTINUED] Semaglutide (RYBELSUS) 7 MG TABS Take 1 tablet by mouth daily.   Facility-Administered Encounter Medications as of 11/16/2022  Medication   0.9 %  sodium chloride infusion    Allergies (verified) Flagyl [metronidazole]   History: Past Medical History:  Diagnosis Date   Blood transfusion without reported diagnosis at birth    mom was Rh neg and pt +   Cancer ([place] 12/2016   ENDOMETRIAL CANCER   Cataract    Dr NAustin Miles  Diabetes mellitus without complication ([facility]    TYPE 2   Genetic testing 04/07/2017   Ms. Laurent underwent genetic counseling and testing for hereditary cancer syndromes on 03/24/2017. There were no pathogenic mutations identified in any of the 46  genes analyzed by [facility]'s 46-gene Common Hereditary Cancers Panel. Genes analyzed include: APC, ATM, AXIN2, BARD1, BMPR1A, BRCA1, BRCA2, BRIP1, CDH1, CDKN2A, CHEK2, CTNNA1, DICER1, EPCAM, GREM1, HOXB13, KIT, MEN1, MLH1, MSH2, MSH3, MSH6   Leg cramps    and feet OCC   Pneumonia YRS AGO   PONV (postoperative nausea and vomiting)    NAUSEA   Skin rash 10/21/2016   CONTACT DERMITITIS OCC   Past Surgical History:  Procedure Laterality Date   ABDOMINAL HYSTERECTOMY  01/04/2017   Dr Everitt Amber   COLONOSCOPY  done at AGE 51    w/Dr.Mann=normal exam   DILATATION & CURETTAGE/HYSTEROSCOPY WITH MYOSURE N/A 11/02/2016   Procedure: [place];  Surgeon: Molli Posey, MD;  Location: [facility];  Service: Gynecology;  Laterality: N/A;   DILATION AND CURETTAGE OF UTERUS     EYE SURGERY  04/09/22 (L) & 05/07/22 (R)   Dr Darleen Crocker   LYMPH NODE BIOPSY N/A 01/04/2017   Procedure: SENTINEL LYMPH NODE BIOPSY;  Surgeon: Everitt Amber, MD;  Location: [facility];  Service: Gynecology;  Laterality: N/A;   ROBOTIC ASSISTED TOTAL HYSTERECTOMY WITH BILATERAL SALPINGO OOPHERECTOMY Bilateral 01/04/2017   Procedure: XI ROBOTIC ASSISTED TOTAL HYSTERECTOMY WITH BILATERAL SALPINGO OOPHORECTOMY;  Surgeon: Everitt Amber, MD;  Location: [facility];  Service: Gynecology;  Laterality: Bilateral;   TUBAL LIGATION  YRS AGO   Family History  Problem Relation Age of Onset   Heart Problems Father    COPD Father    Heart disease Father    Cervical cancer Sister 69       full sister   Cancer Sister    Kidney disease Sister    High blood pressure Son    Alcohol abuse Son    Drug abuse Son    High blood pressure Son    Alcohol abuse Son    Anxiety disorder Son    Depression Son    Drug abuse Son    Breast cancer Sister 44       maternal half sister   Melanoma Sister    Cancer Sister    Hearing loss Sister    Bone cancer Mother 41       d.69   Cancer Mother    COPD Mother    Early death Maternal Grandmother    Stomach cancer Maternal Grandfather 43       d.60s   Cancer Maternal Grandfather    Colon cancer Paternal Grandmother 103       d.84   Cancer Paternal Grandmother    Diabetes Paternal Grandmother    Stroke Paternal Grandmother    Kidney cancer Paternal Grandfather 41       d.58   Cancer Paternal Grandfather    Varicose Veins Maternal Aunt    Social History   Socioeconomic History   Marital status: Widowed    Spouse name: Not on file   Number of children: 2   Years of education: Not on file    Highest education level: Not on file  Occupational History   Not on file  Tobacco Use   Smoking status: Never   Smokeless tobacco: Never  Vaping Use   Vaping Use: Never used  Substance and Sexual Activity   Alcohol use: No   Drug use: No   Sexual activity: Not Currently    Birth control/protection: Post-menopausal, Surgical  Other Topics Concern   Not on file  Social History Narrative   13 grands, 3 greats.   Lives w/son(disabled).  Social Determinants of Health   Financial Resource Strain: Low Risk  (11/16/2022)   Overall Financial Resource Strain (CARDIA)    Difficulty of Paying Living Expenses: Not hard at all  Food Insecurity: No Food Insecurity (11/16/2022)   Hunger Vital Sign    Worried About Running Out of Food in the Last Year: Never true    Ran Out of Food in the Last Year: Never true  Transportation Needs: No Transportation Needs (11/16/2022)   PRAPARE - Hydrologist (Medical): No    Lack of Transportation (Non-Medical): No  Physical Activity: Insufficiently Active (11/16/2022)   Exercise Vital Sign    Days of Exercise per Week: 2 days    Minutes of Exercise per Session: 30 min  Stress: No Stress Concern Present (11/16/2022)   [place]    Feeling of Stress : Not at all  Social Connections: Socially Isolated (11/16/2022)   Social Connection and Isolation Panel [NHANES]    Frequency of Communication with Friends and Family: More than three times a week    Frequency of Social Gatherings with Friends and Family: More than three times a week    Attends Religious Services: Never    Marine scientist or Organizations: No    Attends Archivist Meetings: Never    Marital Status: Widowed    Tobacco Counseling Counseling given: Not Answered   Clinical Intake:  Pre-visit preparation completed: Yes  Pain : No/denies pain     BMI - recorded:  32.27 Nutritional Status: BMI > 30  Obese Nutritional Risks: None Diabetes: Yes CBG done?: No Did pt. bring in CBG monitor from home?: No  How often do you need to have someone help you when you read instructions, pamphlets, or other written materials from your doctor or pharmacy?: 1 - Never  Diabetic?Nutrition Risk Assessment:  Has the patient had any N/V/D within the last 2 months?  No  Does the patient have any non-healing wounds?  No  Has the patient had any unintentional weight loss or weight gain?  No   Diabetes:  Is the patient diabetic?  Yes  If diabetic, was a CBG obtained today?  No  Did the patient bring in their glucometer from home?  No  How often do you monitor your CBG's? N/A.   Financial Strains and Diabetes Management:  Are you having any financial strains with the device, your supplies or your medication? No .  Does the patient want to be seen by Chronic Care Management for management of their diabetes?  No  Would the patient like to be referred to a Nutritionist or for Diabetic Management?  No   Diabetic Exams:  Diabetic Eye Exam: Overdue for diabetic eye exam. Pt has been advised about the importance in completing this exam. Patient advised to call and schedule an eye exam. Diabetic Foot Exam: Overdue, Pt has been advised about the importance in completing this exam. Pt is scheduled for diabetic foot exam on next appt .   Interpreter Needed?: No  Information entered by :: Charlott Rakes, LPN   Activities of Daily Living    11/16/2022    1:53 PM  In your present state of health, do you have any difficulty performing the following activities:  Hearing? 0  Vision? 0  Difficulty concentrating or making decisions? 0  Walking or climbing stairs? 0  Dressing or bathing? 0  Doing errands, shopping? 0  Preparing Food  and eating ? N  Using the Toilet? N  In the past six months, have you accidently leaked urine? N  Do you have problems with loss of bowel  control? N  Managing your Medications? N  Managing your Finances? N  Housekeeping or managing your Housekeeping? N    Patient Care Team: Tawnya Crook, MD as PCP - General (Family Medicine)  Indicate any recent Medical Services you may have received from other than [facility] providers in the past year (date may be approximate).     Assessment:   This is a routine wellness examination for Yumi.  Hearing/Vision screen Hearing Screening - Comments:: Pt denies any hearing issues  Vision Screening - Comments:: Pt follows up with Dr Laban Emperor for annual eye exams   Dietary issues and exercise activities discussed: Current Exercise Habits: Home exercise routine, Type of exercise: walking, Time (Minutes): 30, Frequency (Times/Week): 2, Weekly Exercise (Minutes/Week): 60   Goals Addressed             This Visit's Progress    Patient Stated       Lose weight        Depression Screen    11/16/2022    1:49 PM 09/14/2022    9:06 AM 09/14/2022    9:04 AM  PHQ 2/9 Scores  PHQ - 2 Score 0 0 0  PHQ- 9 Score  0     Fall Risk    11/16/2022    1:53 PM 09/14/2022    9:01 AM  [place] in the past year? 0 1  Number falls in past yr: 0 0  Injury with Fall? 0 0  Risk for fall due to :  Impaired balance/gait  Follow up Falls prevention discussed Education provided;Falls prevention discussed    FALL RISK PREVENTION PERTAINING TO THE HOME:  Any stairs in or around the home? Yes  If so, are there any without handrails? No  Home free of loose throw rugs in walkways, pet beds, electrical cords, etc? Yes  Adequate lighting in your home to reduce risk of falls? Yes   ASSISTIVE DEVICES UTILIZED TO PREVENT FALLS:  Life alert? No  Use of a cane, walker or w/c? No  Grab bars in the bathroom? Yes  Shower chair or bench in shower? No  Elevated toilet seat or a handicapped toilet? No   TIMED UP AND GO:  Was the test performed? No .  Cognitive Function:        11/16/2022     1:53 PM  6CIT Screen  What Year? 0 points  What month? 0 points  What time? 0 points  Count back from 20 0 points  Months in reverse 0 points  Repeat phrase 0 points  Total Score 0 points    Immunizations Immunization History  Administered Date(s) Administered   Fluad Quad(high Dose 65+) 09/14/2022   Influenza, High Dose Seasonal PF 09/23/2018   Influenza,inj,Quad PF,6+ Mos 10/04/2017   Influenza-Unspecified 09/27/2019, 10/14/2021   Moderna Sars-Covid-2 Vaccination 02/14/2020, 03/14/2020, 10/16/2020   Zoster Recombinat (Shingrix) 08/13/2021, 11/04/2021    TDAP status: Due, Education has been provided regarding the importance of this vaccine. Advised may receive this vaccine at local pharmacy or Health Dept. Aware to provide a copy of the vaccination record if obtained from local pharmacy or Health Dept. Verbalized acceptance and understanding.  Flu Vaccine status: Up to date  Pneumococcal vaccine status: Due, Education has been provided regarding the importance of this vaccine. Advised may  receive this vaccine at local pharmacy or Health Dept. Aware to provide a copy of the vaccination record if obtained from local pharmacy or Health Dept. Verbalized acceptance and understanding.  Covid-19 vaccine status: Completed vaccines  Qualifies for Shingles Vaccine? Yes   Zostavax completed Yes   Shingrix Completed?: Yes  Screening Tests Health Maintenance  Topic Date Due   OPHTHALMOLOGY EXAM  Never done   Pneumonia Vaccine 4+ Years old (1 - PCV) Never done   DEXA SCAN  Never done   COVID-19 Vaccine (4 - 2023-24 season) 08/20/2022   FOOT EXAM  09/07/2022   COLONOSCOPY (Pts 45-55yr Insurance coverage will need to be confirmed)  01/23/2023   HEMOGLOBIN A1C  03/15/2023   MAMMOGRAM  08/19/2023   Diabetic kidney evaluation - GFR measurement  09/15/2023   Diabetic kidney evaluation - Urine ACR  09/15/2023   Medicare Annual Wellness (AWV)  11/17/2023   INFLUENZA VACCINE  Completed    Hepatitis C Screening  Completed   Zoster Vaccines- Shingrix  Completed   HPV VACCINES  Aged Out    Health Maintenance  Health Maintenance Due  Topic Date Due   OPHTHALMOLOGY EXAM  Never done   Pneumonia Vaccine 72 Years old (1 - PCV) Never done   DEXA SCAN  Never done   COVID-19 Vaccine (4 - 2023-24 season) 08/20/2022   FOOT EXAM  09/07/2022    Colorectal cancer screening: Type of screening: Colonoscopy. Completed 01/23/18. Repeat every 5 years  Mammogram status: Completed 08/18/21. Repeat every year  Bone Density status: Completed 06/04/19. Results reflect: Bone density results: NORMAL. Repeat every 2 years.  Additional Screening:  Hepatitis C Screening:  Completed 09/14/22  Vision Screening: Recommended annual ophthalmology exams for early detection of glaucoma and other disorders of the eye. Is the patient up to date with their annual eye exam?  No  Who is the provider or what is the name of the office in which the patient attends annual eye exams? Dr HLaban EmperorIf pt is not established with a provider, would they like to be referred to a provider to establish care? No .   Dental Screening: Recommended annual dental exams for proper oral hygiene  Community Resource Referral / Chronic Care Management: CRR required this visit?  No   CCM required this visit?  No      Plan:     I have personally reviewed and noted the following in the patient's chart:   Medical and social history Use of alcohol, tobacco or illicit drugs  Current medications and supplements including opioid prescriptions. Patient is not currently taking opioid prescriptions. Functional ability and status Nutritional status Physical activity Advanced directives List of other physicians Hospitalizations, surgeries, and ER visits in previous 12 months Vitals Screenings to include cognitive, depression, and falls Referrals and appointments  In addition, I have reviewed and discussed with patient certain  preventive protocols, quality metrics, and best practice recommendations. A written personalized care plan for preventive services as well as general preventive health recommendations were provided to patient.     TWillette Brace LPN   [id number]  Nurse Notes: none

## 2022-11-25 DIAGNOSIS — Z6831 Body mass index (BMI) 31.0-31.9, adult: Secondary | ICD-10-CM | POA: Diagnosis not present

## 2022-11-25 DIAGNOSIS — L293 Anogenital pruritus, unspecified: Secondary | ICD-10-CM | POA: Diagnosis not present

## 2022-11-25 DIAGNOSIS — Z01419 Encounter for gynecological examination (general) (routine) without abnormal findings: Secondary | ICD-10-CM | POA: Diagnosis not present

## 2022-11-25 DIAGNOSIS — Z1231 Encounter for screening mammogram for malignant neoplasm of breast: Secondary | ICD-10-CM | POA: Diagnosis not present

## 2022-11-25 DIAGNOSIS — Z7689 Persons encountering health services in other specified circumstances: Secondary | ICD-10-CM | POA: Diagnosis not present

## 2022-11-25 LAB — HM MAMMOGRAPHY

## 2022-12-07 ENCOUNTER — Ambulatory Visit (INDEPENDENT_AMBULATORY_CARE_PROVIDER_SITE_OTHER): Payer: Medicare Other | Admitting: Internal Medicine

## 2022-12-07 ENCOUNTER — Encounter: Payer: Self-pay | Admitting: Internal Medicine

## 2022-12-07 VITALS — BP 128/68 | HR 67 | Ht 64.0 in | Wt 178.8 lb

## 2022-12-07 DIAGNOSIS — E785 Hyperlipidemia, unspecified: Secondary | ICD-10-CM | POA: Diagnosis not present

## 2022-12-07 DIAGNOSIS — E1165 Type 2 diabetes mellitus with hyperglycemia: Secondary | ICD-10-CM | POA: Diagnosis not present

## 2022-12-07 DIAGNOSIS — Z6835 Body mass index (BMI) 35.0-35.9, adult: Secondary | ICD-10-CM

## 2022-12-07 LAB — POCT GLYCOSYLATED HEMOGLOBIN (HGB A1C): Hemoglobin A1C: 6.4 % — AB (ref 4.0–5.6)

## 2022-12-07 NOTE — Progress Notes (Signed)
Patient ID: Angela Angela Mays, female   DOB: 03/04/1951, 71 y.o.   MRN: [id number]   HPI: Angela Angela Mays is Angela Mays 71 y.o.-year-old female, returning for f/u for DM2, dx in 09/2014, non-insulin-dependent, uncontrolled, without long term complications. Last visit 6.5 months ago.  Interim history: No increased urination, nausea, chest pain.  Her blurry vision resolved after cataract surgeries. She has increased stress at home - sister (in [place], [place]) with breast cancer, sister-in-law also ill. She had all her teeth pulled 1 mo ago >> sugars improved.  Reviewed HbA1c levels: Lab Results  Component Value Date   HGBA1C 7.7 (H) 09/14/2022   HGBA1C 7.5 (Angela Mays) 05/18/2022   HGBA1C 6.8 (Angela Mays) 01/13/2022  11/07/2019: HbA1c: 6.9%  Pt is on: - Metformin XR 1500 >> 1000 mg twice Angela Mays day - Jardiance 25 mg before first meal of the day (on terconazole) - Glimepiride 2 >> 2-4 >> 2 mg before dinner - every day still after dinner!!!! - Rybelsus 3.5 mg daily -was not able to start because it was not covered.  Pt is checking sugars  times Angela Mays day: - am  78-125 >> 89-127 >> 97-144, 155 >> 98-143 >> 94-126 - 2h after b'fast: 174 >> 151-167 >> 132-187 >> 113-143, 163 - before lunch: 115-129 >> 110-139, 155 >> 103-154 >> 124, 137 - 2h after lunch: 78, 143-182 >> 159-197, 227>> 142, 157 - before dinner: 80-169 >> 76, 89-149, 153 >> 82-158 >> 84-142 - 2h after dinner: 186-208 >> 157-195 >> 170-227 >> 142-181 - bedtime: 153-167 >> 70-154 >> 124 >> 114-263 >> 87-148 - nighttime: 99-111 >> 84-139 >> 84-172 >> 68, 95-139 Lowest: 78 >> 80 >> 76 >> 82 >> 69 Highest: 192 >> 197 >> 208 >> 195 >> 263 >> 181  Glucometer: One Touch ultra mini  Pt's meals are: - Breakfast: oatmeal or PB sandwich or bacon and eggs or cereals or skips during the week - Lunch: sandwich or spaghetti or fast foods - Dinner: largest - meat + veggies + starch - Snacks: 2-3: less chocolate, potato chips,  M&M She rarely has sweet drinks.  -No CKD last  BUN/creatinine:  Lab Results  Component Value Date   BUN 14 09/14/2022   BUN 23 07/03/2018   CREATININE 0.80 09/14/2022   CREATININE 0.71 07/03/2018  09/07/2021: 15/0.75, Glu 112 09/03/2020: 15/0.73, glucose 111 03/29/2017: ACR undet. She is not on ACE inhibitor/ARB.  -+ HL; last set of lipids: Lab Results  Component Value Date   CHOL 151 09/14/2022   HDL 53.10 09/14/2022   LDLCALC 83 09/14/2022   TRIG 74.0 09/14/2022   CHOLHDL 3 09/14/2022  09/07/2021: 152/72/51/87 09/03/2020: 146/63/50/79 11/07/2019: 162/58/57/94 03/29/2017: 193/56/63/119 On atorvastatin 10 mg daily  - last eye exam was in 12/2021: No DR reportedly, + Cataract, no DR. On Ocuvite. Cataract sx 04 and 04/2022.  - no numbness and tingling in her feet.  Last foot exam September 07, 2021.  She has Angela Mays history of endometrial cancer and had hysterectomy. She does not have Angela Mays ho pancreatitis or FH of MTC. She was laid off from her job in 06/2019.  ROS: + see HPI  I reviewed pt's medications, allergies, PMH, social hx, family hx, and changes were documented in the history of present illness. Otherwise, unchanged from my initial visit note.  Past Medical History:  Diagnosis Date   Blood transfusion without reported diagnosis at birth    mom was Rh neg and pt +   Cancer ([facility]) 12/2016  ENDOMETRIAL CANCER   Cataract    Dr Angela Angela Mays   Diabetes mellitus without complication [facility])    TYPE 2   Genetic testing 04/07/2017   Ms. Angela Angela Mays underwent genetic counseling and testing for hereditary cancer syndromes on 03/24/2017. There were no pathogenic mutations identified in any of the 46 genes analyzed by [facility]'s 46-gene Common Hereditary Cancers Panel. Genes analyzed include: APC, ATM, AXIN2, BARD1, BMPR1A, BRCA1, BRCA2, BRIP1, CDH1, CDKN2A, CHEK2, CTNNA1, DICER1, EPCAM, GREM1, HOXB13, KIT, MEN1, MLH1, MSH2, MSH3, MSH6   Leg cramps    and feet OCC   Pneumonia YRS AGO   PONV (postoperative nausea and vomiting)    NAUSEA    Skin rash 10/21/2016   CONTACT DERMITITIS OCC   Past Surgical History:  Procedure Laterality Date   ABDOMINAL HYSTERECTOMY  01/04/2017   Dr Angela Angela Mays   COLONOSCOPY  done at AGE 68   w/Dr.Mann=normal exam   DILATATION & CURETTAGE/HYSTEROSCOPY WITH MYOSURE Angela/Angela Mays 11/02/2016   Procedure: [place];  Surgeon: Angela Posey, MD;  Location: [facility];  Service: Gynecology;  Laterality: Angela/Angela Mays;   DILATION AND CURETTAGE OF UTERUS     EYE SURGERY  04/09/22 (L) & 05/07/22 (R)   Dr Angela Angela Mays   LYMPH NODE BIOPSY Angela/Angela Mays 01/04/2017   Procedure: SENTINEL LYMPH NODE BIOPSY;  Surgeon: Angela Amber, MD;  Location: [facility];  Service: Gynecology;  Laterality: Angela/Angela Mays;   ROBOTIC ASSISTED TOTAL HYSTERECTOMY WITH BILATERAL SALPINGO OOPHERECTOMY Bilateral 01/04/2017   Procedure: XI ROBOTIC ASSISTED TOTAL HYSTERECTOMY WITH BILATERAL SALPINGO OOPHORECTOMY;  Surgeon: Angela Amber, MD;  Location: [facility];  Service: Gynecology;  Laterality: Bilateral;   TUBAL LIGATION  YRS AGO   Social History   Social History   Marital status: Single    Spouse name: Angela/Angela Mays   Number of children: 2   Occupational History   Glass blower/designer    Social History Main Topics   Smoking status: Never Smoker   Smokeless tobacco: Never Used   Alcohol use No   Drug use: No   Current Outpatient Medications on File Prior to Visit  Medication Sig Dispense Refill   ASPIRIN EC LOW DOSE PO Take 81 mg by mouth daily.     atorvastatin (LIPITOR) 10 MG tablet Take 1 tablet (10 mg total) by mouth daily. 90 tablet 3   blood glucose meter kit and supplies KIT Dispense based on patient and insurance preference. Use 2x times daily as directed. (FOR ICD-9 250.00, 250.01). 1 each 0   Cholecalciferol (D3) 50 MCG (2000 UT) TABS Take 1 tablet by mouth daily.     glimepiride (AMARYL) 2 MG tablet TAKE 1 TABLET BEFORE DINNER 90 tablet 3   JARDIANCE 25 MG TABS tablet TAKE 1 TABLET DAILY 90 tablet 3   lisinopril (ZESTRIL) 2.5 MG tablet  Take 1 tablet (2.5 mg total) by mouth daily. 90 tablet 3   metFORMIN (GLUCOPHAGE-XR) 500 MG 24 hr tablet Take 2 tablets (1,000 mg total) by mouth 2 (two) times daily before Angela Mays meal. 360 tablet 3   nystatin-triamcinolone (MYCOLOG II) cream APPLY CREAM TOPICALLY TO AFFECTED AREA(S) TWICE DAILY (IN THE MORNING AND IN THE EVENING) (Patient not taking: Reported on 11/16/2022)     Current Facility-Administered Medications on File Prior to Visit  Medication Dose Route Frequency Provider Last Rate Last Admin   0.9 %  sodium chloride infusion  500 mL Intravenous Once Nandigam, Venia Minks, MD       Allergies  Allergen Reactions   Flagyl [  Metronidazole] Rash   Family History  Problem Relation Age of Onset   Heart Problems Father    COPD Father    Heart disease Father    Cervical cancer Sister 65       full sister   Cancer Sister    Kidney disease Sister    High blood pressure Son    Alcohol abuse Son    Drug abuse Son    High blood pressure Son    Alcohol abuse Son    Anxiety disorder Son    Depression Son    Drug abuse Son    Breast cancer Sister 89       maternal half sister   Melanoma Sister    Cancer Sister    Hearing loss Sister    Bone cancer Mother 53       d.69   Cancer Mother    COPD Mother    Early death Maternal Grandmother    Stomach cancer Maternal Grandfather 50       d.60s   Cancer Maternal Grandfather    Colon cancer Paternal Grandmother 2       d.84   Cancer Paternal Grandmother    Diabetes Paternal Grandmother    Stroke Paternal Grandmother    Kidney cancer Paternal Grandfather 39       d.58   Cancer Paternal Grandfather    Varicose Veins Maternal Aunt    PE: BP 128/68 (BP Location: Right Arm, Patient Position: Sitting, Cuff Size: Normal)   Pulse 67   Ht _0  (1.626 m)   Wt 178 lb 12.8 oz (81.1 kg)   SpO2 99%   BMI 30.69 kg/m  Wt Readings from Last 3 Encounters:  12/07/22 178 lb 12.8 oz (81.1 kg)  11/16/22 188 lb (85.3 kg)  09/14/22 188 lb 4 oz  (85.4 kg)   Constitutional: overweight, in NAD Eyes:  EOMI, no exophthalmos ENT: no neck masses, no cervical lymphadenopathy Cardiovascular: RRR, No MRG Respiratory: CTA B Musculoskeletal: no deformities Skin:no rashes Neurological: no tremor with outstretched hands Diabetic Foot Exam - Simple   Simple Foot Form Diabetic Foot exam was performed with the following findings: Yes 12/07/2022  8:33 AM  Visual Inspection No deformities, no ulcerations, no other skin breakdown bilaterally: Yes Sensation Testing Intact to touch and monofilament testing bilaterally: Yes Pulse Check Posterior Tibialis and Dorsalis pulse intact bilaterally: Yes Comments     ASSESSMENT: 1. DM2, non-insulin-dependent, uncontrolled, without long term complications, but with hyperglycemia  2. Obesity class 2 BMI Classification: < 18.5 underweight  18.5-24.9 normal weight  25.0-29.9 overweight  30.0-34.9 class I obesity  35.0-39.9 class II obesity  ? 40.0 class III obesity   3. HL  PLAN:  1. Patient with history of uncontrolled type after she started to improve her diet, reducing sweet drinks, and adding Jardiance.  HbA1c improved to 6.4%, but started to increase, sugars are higher in the morning and also occasionally after meals.  She was forgetting to take glimepiride before dinner and was occasionally taking it later.  She had some spikes in the 200s after eating out.  We discussed about improving diet and I recommended adding the p.o. GLP-1's.  HbA1c at 7.1% and since then she had another HbA1c obtained 3 months ago, which was even higher, at 7.7%. -At today's visit, sugars are significantly improved from before.  They are at target in the morning and mostly at target later in the day, with only few slightly hyperglycemic exceptions.  Despite  the fact that she could not obtain Rybelsus, sugars improved that she got her teeth pulled and she was not able to eat as much as before.  At today's visit, we do not  need to add another medication, but she is still not taking glimepiride correctly.  Instead of taking this before Angela Mays meal, she takes it afterwards.  I strongly advised her to move this 15 to 30 minutes before her largest meal, which is usually dinner.  Since she had Angela Mays blood sugar at 68 once after dinner, I advised her to take 1, not 2 tablets of glimepiride before the meal. - I suggested to:  Patient Instructions  Please continue: - Metformin XR 1000 mg 2x Angela Mays day - Jardiance 25 mg before first meal of the day   Move: - Glimepiride 2 mg 15-30 min before dinner  Please return in 4 months with your sugar log.   - we checked her HbA1c: 6.4% (better) - advised to check sugars at different times of the day - 1x Angela Mays day, rotating check times - advised for yearly eye exams >> she is UTD - return to clinic in 4 months  2. Obesity class 2 -We discussed in the past about eliminating sweet tea and juice-only occasionally drinking this now -Will continue Jardiance which should also help with weight loss -She gained 2 pounds before last visit and lost 10 pounds since then  3. HL - Reviewed latest lipid panel from 08/2022: LDL higher than our target of less than 70, the rest of the fractions excellent: Lab Results  Component Value Date   CHOL 151 09/14/2022   HDL 53.10 09/14/2022   LDLCALC 83 09/14/2022   TRIG 74.0 09/14/2022   CHOLHDL 3 09/14/2022  - Continues atorvastatin 10 mg daily without side effects  Philemon Kingdom, MD PhD [facility]

## 2022-12-07 NOTE — Patient Instructions (Addendum)
Please continue: - Metformin XR 1000 mg 2x a day - Jardiance 25 mg before first meal of the day   Move: - Glimepiride 2 mg 15-30 min before dinner  Please return in 4 months with your sugar log.

## 2023-02-03 ENCOUNTER — Other Ambulatory Visit: Payer: Self-pay | Admitting: Internal Medicine

## 2023-04-13 ENCOUNTER — Encounter: Payer: Self-pay | Admitting: Internal Medicine

## 2023-04-13 ENCOUNTER — Ambulatory Visit (INDEPENDENT_AMBULATORY_CARE_PROVIDER_SITE_OTHER): Payer: Medicare Other | Admitting: Internal Medicine

## 2023-04-13 VITALS — BP 136/86 | HR 68 | Ht 64.0 in | Wt 183.4 lb

## 2023-04-13 DIAGNOSIS — E1165 Type 2 diabetes mellitus with hyperglycemia: Secondary | ICD-10-CM | POA: Diagnosis not present

## 2023-04-13 DIAGNOSIS — Z7984 Long term (current) use of oral hypoglycemic drugs: Secondary | ICD-10-CM | POA: Diagnosis not present

## 2023-04-13 DIAGNOSIS — E669 Obesity, unspecified: Secondary | ICD-10-CM

## 2023-04-13 DIAGNOSIS — E66811 Obesity, class 1: Secondary | ICD-10-CM | POA: Insufficient documentation

## 2023-04-13 DIAGNOSIS — E785 Hyperlipidemia, unspecified: Secondary | ICD-10-CM | POA: Diagnosis not present

## 2023-04-13 LAB — POCT GLYCOSYLATED HEMOGLOBIN (HGB A1C): Hemoglobin A1C: 6.5 % — AB (ref 4.0–5.6)

## 2023-04-13 MED ORDER — METFORMIN HCL ER 500 MG PO TB24: 500.0000 mg | ORAL_TABLET | Freq: Two times a day (BID) | ORAL | 3 refills | Status: DC

## 2023-04-13 NOTE — Patient Instructions (Addendum)
Please continue: - Metformin XR 500 mg 2x a day - Jardiance 25 mg before first meal of the day  - Glimepiride 2-4 mg 15-30 min before dinner   Try to schedule a new eye exam.  Please return in 4-6 months with your sugar log.

## 2023-04-13 NOTE — Progress Notes (Signed)
Patient ID: Angela Mays, female   DOB: 1951/12/06, 72 y.o.   MRN: [id number]   HPI: Angela Mays is a 72 y.o.-year-old female, returning for f/u for DM2, dx in 09/2014, non-insulin-dependent, uncontrolled, without long term complications. Last visit 4 months ago.  Interim history: No increased urination, nausea, chest pain.  Her blurry vision resolved after cataract surgeries. She was more busy as her sister died since last visit.  She had breast cancer.  Reviewed HbA1c levels: Lab Results  Component Value Date   HGBA1C 6.4 (A) 12/07/2022   HGBA1C 7.7 (H) 09/14/2022   HGBA1C 7.5 (A) 05/18/2022  11/07/2019: HbA1c: 6.9%  Pt is on: - Metformin XR 1500 >> 1000 >> 500 mg twice a day (she reduced the dose since last visit due to negative publicity) - Jardiance 25 mg before first meal of the day (on terconazole) - Glimepiride 2 >> 2-4 mg after dinner >> 2 mg before dinner  Rybelsus was not covered.  Pt is checking sugars  times a day: - am  78-125 >> 89-127 >> 97-144, 155 >> 98-143 >> 94-126 >> 106-127 - 2h after b'fast: 174 >> 151-167 >> 132-187 >> 113-143, 163 >> 143-169 - before lunch: 115-129 >> 110-139, 155 >> 103-154 >> 124, 137 >> 78, 102-137 - 2h after lunch: 78, 143-182 >> 159-197, 227>> 142, 157 >> 140-162 - before dinner: 80-169 >> 76, 89-149, 153 >> 82-158 >> 84-142 >> 87-130 - 2h after dinner: 186-208 >> 157-195 >> 170-227 >> 142-181 >> 163-193 - bedtime: 153-167 >> 70-154 >> 124 >> 114-263 >> 87-148 >> 128-176 - nighttime: 99-111 >> 84-139 >> 84-172 >> 68, 95-139 >> 96, 121-147 Lowest: 76 >> 82 >> 69 >> 78 Highest: 263 >> 181 >> 193  Glucometer: One Touch ultra mini  Pt's meals are: - Breakfast: oatmeal or PB sandwich or bacon and eggs or cereals or skips during the week - Lunch: sandwich or spaghetti or fast foods - Dinner: largest - meat + veggies + starch - Snacks: 2-3: less chocolate, potato chips,  M&M She rarely has sweet drinks.  -No CKD last  BUN/creatinine:  Lab Results  Component Value Date   BUN 14 09/14/2022   BUN 23 07/03/2018   CREATININE 0.80 09/14/2022   CREATININE 0.71 07/03/2018  09/07/2021: 15/0.75, Glu 112 09/03/2020: 15/0.73, glucose 111 03/29/2017: ACR undet. She is not on ACE inhibitor/ARB.  -+ HL; last set of lipids: Lab Results  Component Value Date   CHOL 151 09/14/2022   HDL 53.10 09/14/2022   LDLCALC 83 09/14/2022   TRIG 74.0 09/14/2022   CHOLHDL 3 09/14/2022  09/07/2021: 152/72/51/87 09/03/2020: 146/63/50/79 11/07/2019: 162/58/57/94 03/29/2017: 193/56/63/119 On atorvastatin 10 mg daily  - last eye exam was in 2023: No DR reportedly, + Cataract, no DR. On Ocuvite. Cataract sx 04 and 04/2022.  - no numbness and tingling in her feet.  Last foot exam 12/07/2022.  She has a history of endometrial cancer and had hysterectomy. She does not have a ho pancreatitis or FH of MTC. She was laid off from her job in 06/2019.  ROS: + see HPI  I reviewed pt's medications, allergies, PMH, social hx, family hx, and changes were documented in the history of present illness. Otherwise, unchanged from my initial visit note.  Past Medical History:  Diagnosis Date   Blood transfusion without reported diagnosis at birth    mom was Rh neg and pt +   Cancer ([facility]) 12/2016   ENDOMETRIAL CANCER   Cataract  Dr Meyer Russel   Diabetes mellitus without complication [facility])    TYPE 2   Genetic testing 04/07/2017   Ms. Wegner underwent genetic counseling and testing for hereditary cancer syndromes on 03/24/2017. There were no pathogenic mutations identified in any of the 46 genes analyzed by [facility]'s 46-gene Common Hereditary Cancers Panel. Genes analyzed include: APC, ATM, AXIN2, BARD1, BMPR1A, BRCA1, BRCA2, BRIP1, CDH1, CDKN2A, CHEK2, CTNNA1, DICER1, EPCAM, GREM1, HOXB13, KIT, MEN1, MLH1, MSH2, MSH3, MSH6   Leg cramps    and feet OCC   Pneumonia YRS AGO   PONV (postoperative nausea and vomiting)    NAUSEA   Skin  rash 10/21/2016   CONTACT DERMITITIS OCC   Past Surgical History:  Procedure Laterality Date   ABDOMINAL HYSTERECTOMY  01/04/2017   Dr Adolphus Birchwood   COLONOSCOPY  done at AGE 89   w/Dr.Mann=normal exam   DILATATION & CURETTAGE/HYSTEROSCOPY WITH MYOSURE N/A 11/02/2016   Procedure: DILATATION & CURETTAGE/HYSTEROSCOPY WITH MYOSURE;  Surgeon: Richarda Overlie, MD;  Location: [facility];  Service: Gynecology;  Laterality: N/A;   DILATION AND CURETTAGE OF UTERUS     EYE SURGERY  04/09/22 (L) & 05/07/22 (R)   Dr Mia Creek   LYMPH NODE BIOPSY N/A 01/04/2017   Procedure: SENTINEL LYMPH NODE BIOPSY;  Surgeon: Adolphus Birchwood, MD;  Location: [facility];  Service: Gynecology;  Laterality: N/A;   ROBOTIC ASSISTED TOTAL HYSTERECTOMY WITH BILATERAL SALPINGO OOPHERECTOMY Bilateral 01/04/2017   Procedure: XI ROBOTIC ASSISTED TOTAL HYSTERECTOMY WITH BILATERAL SALPINGO OOPHORECTOMY;  Surgeon: Adolphus Birchwood, MD;  Location: [facility];  Service: Gynecology;  Laterality: Bilateral;   TUBAL LIGATION  YRS AGO   Social History   Social History   Marital status: Single    Spouse name: N/A   Number of children: 2   Occupational History   Print production planner    Social History Main Topics   Smoking status: Never Smoker   Smokeless tobacco: Never Used   Alcohol use No   Drug use: No   Current Outpatient Medications on File Prior to Visit  Medication Sig Dispense Refill   ASPIRIN EC LOW DOSE PO Take 81 mg by mouth daily.     atorvastatin (LIPITOR) 10 MG tablet Take 1 tablet (10 mg total) by mouth daily. 90 tablet 3   blood glucose meter kit and supplies KIT Dispense based on patient and insurance preference. Use 2x times daily as directed. (FOR ICD-9 250.00, 250.01). 1 each 0   Cholecalciferol (D3) 50 MCG (2000 UT) TABS Take 1 tablet by mouth daily.     glimepiride (AMARYL) 2 MG tablet TAKE 1 TABLET BEFORE DINNER 90 tablet 3   JARDIANCE 25 MG TABS tablet TAKE 1 TABLET DAILY 90 tablet 3   lisinopril (ZESTRIL) 2.5 MG tablet Take 1  tablet (2.5 mg total) by mouth daily. 90 tablet 3   metFORMIN (GLUCOPHAGE-XR) 500 MG 24 hr tablet Take 2 tablets (1,000 mg total) by mouth 2 (two) times daily before a meal. 360 tablet 3   nystatin-triamcinolone (MYCOLOG II) cream APPLY CREAM TOPICALLY TO AFFECTED AREA(S) TWICE DAILY (IN THE MORNING AND IN THE EVENING) (Patient not taking: Reported on 11/16/2022)     Current Facility-Administered Medications on File Prior to Visit  Medication Dose Route Frequency Provider Last Rate Last Admin   0.9 %  sodium chloride infusion  500 mL Intravenous Once Nandigam, Eleonore Chiquito, MD       Allergies  Allergen Reactions   Flagyl [Metronidazole] Rash   Family History  Problem  Relation Age of Onset   Heart Problems Father    COPD Father    Heart disease Father    Cervical cancer Sister 30       full sister   Cancer Sister    Kidney disease Sister    High blood pressure Son    Alcohol abuse Son    Drug abuse Son    High blood pressure Son    Alcohol abuse Son    Anxiety disorder Son    Depression Son    Drug abuse Son    Breast cancer Sister 42       maternal half sister   Melanoma Sister    Cancer Sister    Hearing loss Sister    Bone cancer Mother 76       d.69   Cancer Mother    COPD Mother    Early death Maternal Grandmother    Stomach cancer Maternal Grandfather 28       d.60s   Cancer Maternal Grandfather    Colon cancer Paternal Grandmother 31       d.84   Cancer Paternal Grandmother    Diabetes Paternal Grandmother    Stroke Paternal Grandmother    Kidney cancer Paternal Grandfather 3       d.58   Cancer Paternal Grandfather    Varicose Veins Maternal Aunt    PE: BP 136/86 (BP Location: Right Arm, Patient Position: Sitting, Cuff Size: Normal)   Pulse 68   Ht 5\' 4"  (1.626 m)   Wt 183 lb 6.4 oz (83.2 kg)   SpO2 99%   BMI 31.48 kg/m  Wt Readings from Last 3 Encounters:  04/13/23 183 lb 6.4 oz (83.2 kg)  12/07/22 178 lb 12.8 oz (81.1 kg)  11/16/22 188 lb (85.3  kg)   Constitutional: overweight, in NAD Eyes:  EOMI, no exophthalmos ENT: no neck masses, no cervical lymphadenopathy Cardiovascular: RRR, No MRG Respiratory: CTA B Musculoskeletal: no deformities Skin:no rashes Neurological: no tremor with outstretched hands  ASSESSMENT: 1. DM2, non-insulin-dependent, uncontrolled, without long term complications, but with hyperglycemia  2. Obesity class 1  3. HL  PLAN:  1. Patient with history of uncontrolled type 2 diabetes, with improved control after she started to improve her diet, reducing sweet drinks, and adding Jardiance.  At last visit, HbA1c was 6.4%, improved.  Sugars were much better, all at target in the morning and mostly at target later in the day, with only few slightly hyperglycemic exceptions.  Despite the fact that she was off Rybelsus as she could not obtain it, sugars were still at goal, possibly contributed to to by the fact that she was not eating well after having had tooth extractions.  We did not change her regimen at that time but we did discuss about possibly moving glimepiride before dinner, as she had a low blood sugar at 68 after dinner due to incorrect dosing of the sulfonylurea, after dinner. -At today's visit, she is taking the glimepiride correctly, before dinner.  She did reduce the dose of metformin since last visit due to negative progression.  We discussed that the concerns are unfounded.  However, for now, since sugars are at goal for the majority of the day, with only occasional mild hyperglycemic exceptions, we discussed about continuing the maximal dose of metformin.  Will also continue Jardiance at the current dose and I advised her that she can increase the glimepiride to 2 tablets before dinner in case of a large dinner. -  I suggested to:  Patient Instructions  Please continue: - Metformin XR 500 mg 2x a day - Jardiance 25 mg before first meal of the day  - Glimepiride 2-4 mg 15-30 min before dinner   Try  to schedule a new eye exam.  Please return in 4-6 months with your sugar log.   - we checked her HbA1c: 6.5% (only slightly higher) - advised to check sugars at different times of the day - 1x a day, rotating check times - advised for yearly eye exams >> she is not UTD (advised to schedule) - return to clinic in 4-6 months  2. Obesity class 1 -We discussed in the past about eliminating sweet tea and juice-only occasionally drinking this now -Will continue the SGLT2 inhibitor (Jardiance) which should also help with weight loss -She lost 10 pounds before last visit as she was not able to eat due to dental work -Since last visit, however, she gained 5 pounds.  3. HL -Reviewed her lipid panel from 08/2022: Fractions at goal with the exception of a slightly high LDL: Lab Results  Component Value Date   CHOL 151 09/14/2022   HDL 53.10 09/14/2022   LDLCALC 83 09/14/2022   TRIG 74.0 09/14/2022   CHOLHDL 3 09/14/2022  -Will continue atorvastatin 10 mg daily.  No side effects  Carlus Pavlov, MD PhD [facility] End[facility]

## 2023-04-20 ENCOUNTER — Encounter: Payer: Self-pay | Admitting: Gastroenterology

## 2023-05-18 DIAGNOSIS — H02886 Meibomian gland dysfunction of left eye, unspecified eyelid: Secondary | ICD-10-CM | POA: Diagnosis not present

## 2023-05-18 DIAGNOSIS — H18413 Arcus senilis, bilateral: Secondary | ICD-10-CM | POA: Diagnosis not present

## 2023-05-18 DIAGNOSIS — E113291 Type 2 diabetes mellitus with mild nonproliferative diabetic retinopathy without macular edema, right eye: Secondary | ICD-10-CM | POA: Diagnosis not present

## 2023-05-18 DIAGNOSIS — H02883 Meibomian gland dysfunction of right eye, unspecified eyelid: Secondary | ICD-10-CM | POA: Diagnosis not present

## 2023-05-18 LAB — HM DIABETES EYE EXAM

## 2023-09-07 ENCOUNTER — Other Ambulatory Visit: Payer: Self-pay

## 2023-09-07 MED ORDER — METFORMIN HCL ER 500 MG PO TB24: 500.0000 mg | ORAL_TABLET | Freq: Two times a day (BID) | ORAL | 2 refills | Status: DC

## 2023-09-21 ENCOUNTER — Encounter: Payer: Self-pay | Admitting: Family Medicine

## 2023-09-21 ENCOUNTER — Ambulatory Visit (INDEPENDENT_AMBULATORY_CARE_PROVIDER_SITE_OTHER): Payer: Medicare Other | Admitting: Family Medicine

## 2023-09-21 VITALS — BP 146/78 | HR 60 | Temp 98.4°F | Resp 16 | Ht 64.0 in | Wt 185.5 lb

## 2023-09-21 DIAGNOSIS — E7800 Pure hypercholesterolemia, unspecified: Secondary | ICD-10-CM | POA: Diagnosis not present

## 2023-09-21 DIAGNOSIS — E1165 Type 2 diabetes mellitus with hyperglycemia: Secondary | ICD-10-CM | POA: Diagnosis not present

## 2023-09-21 DIAGNOSIS — Z23 Encounter for immunization: Secondary | ICD-10-CM

## 2023-09-21 DIAGNOSIS — R030 Elevated blood-pressure reading, without diagnosis of hypertension: Secondary | ICD-10-CM

## 2023-09-21 DIAGNOSIS — Z1211 Encounter for screening for malignant neoplasm of colon: Secondary | ICD-10-CM

## 2023-09-21 DIAGNOSIS — C541 Malignant neoplasm of endometrium: Secondary | ICD-10-CM

## 2023-09-21 LAB — COMPREHENSIVE METABOLIC PANEL
ALT: 13 U/L (ref 0–35)
AST: 15 U/L (ref 0–37)
Albumin: 3.8 g/dL (ref 3.5–5.2)
Alkaline Phosphatase: 110 U/L (ref 39–117)
BUN: 23 mg/dL (ref 6–23)
CO2: 29 meq/L (ref 19–32)
Calcium: 9.5 mg/dL (ref 8.4–10.5)
Chloride: 104 meq/L (ref 96–112)
Creatinine, Ser: 0.77 mg/dL (ref 0.40–1.20)
GFR: 76.41 mL/min (ref >60.00)
Glucose, Bld: 98 mg/dL (ref 70–99)
Potassium: 4.6 meq/L (ref 3.5–5.1)
Sodium: 140 meq/L (ref 135–145)
Total Bilirubin: 0.5 mg/dL (ref 0.2–1.2)
Total Protein: 6.8 g/dL (ref 6.0–8.3)

## 2023-09-21 LAB — LIPID PANEL
Cholesterol: 168 mg/dL (ref 0–200)
HDL: 66.1 mg/dL (ref >39.00)
LDL Cholesterol: 89 mg/dL (ref 0–99)
NonHDL: 101.97
Total CHOL/HDL Ratio: 3
Triglycerides: 67 mg/dL (ref 0.0–149.0)
VLDL: 13.4 mg/dL (ref 0.0–40.0)

## 2023-09-21 LAB — CBC WITH DIFFERENTIAL/PLATELET
Basophils Absolute: 0 10*3/uL (ref 0.0–0.1)
Basophils Relative: 0.3 % (ref 0.0–3.0)
Eosinophils Absolute: 0 10*3/uL (ref 0.0–0.7)
Eosinophils Relative: 0.4 % (ref 0.0–5.0)
HCT: 41.2 % (ref 36.0–46.0)
Hemoglobin: 12.7 g/dL (ref 12.0–15.0)
Lymphocytes Relative: 23.4 % (ref 12.0–46.0)
Lymphs Abs: 2.6 10*3/uL (ref 0.7–4.0)
MCHC: 30.9 g/dL (ref 30.0–36.0)
MCV: 80.1 fL (ref 78.0–100.0)
Monocytes Absolute: 0.6 10*3/uL (ref 0.1–1.0)
Monocytes Relative: 5.1 % (ref 3.0–12.0)
Neutro Abs: 8 10*3/uL — ABNORMAL HIGH (ref 1.4–7.7)
Neutrophils Relative %: 70.8 % (ref 43.0–77.0)
Platelets: 181 10*3/uL (ref 150.0–400.0)
RBC: 5.14 Mil/uL — ABNORMAL HIGH (ref 3.87–5.11)
RDW: 16.5 % — ABNORMAL HIGH (ref 11.5–15.5)
WBC: 11.3 10*3/uL — ABNORMAL HIGH (ref 4.0–10.5)

## 2023-09-21 LAB — HEMOGLOBIN A1C: Hgb A1c MFr Bld: 7.3 % — ABNORMAL HIGH (ref 4.6–6.5)

## 2023-09-21 LAB — MICROALBUMIN / CREATININE URINE RATIO
Creatinine,U: 78 mg/dL
Microalb Creat Ratio: 0.9 mg/g (ref 0.0–30.0)
Microalb, Ur: <0.7 mg/dL (ref 0.0–1.9)

## 2023-09-21 LAB — TSH: TSH: 2.87 u[IU]/mL (ref 0.35–5.50)

## 2023-09-21 LAB — VITAMIN B12: Vitamin B-12: 445 pg/mL (ref 211–911)

## 2023-09-21 NOTE — Assessment & Plan Note (Signed)
Chronic.  Better controlled.  Cont meds.  Managed by endo.  Will see end of October.

## 2023-09-21 NOTE — Assessment & Plan Note (Signed)
In remission.  Seeing gyn in Dec

## 2023-09-21 NOTE — Assessment & Plan Note (Signed)
Chronic.  LDL 83.  On lipitor 10mg .  Continue-managed by endo

## 2023-09-21 NOTE — Progress Notes (Signed)
Phone [phone]   Subjective:   Patient is a 71 y.o. female presenting for annual physical.    Chief Complaint  Patient presents with   Annual Exam    CPE Fasting for blood work   Annual -  Reports she is not regularly exercising.  DM Type 2 - On metformin xr 500 mg bid, jardiance 25mg  and glimepiride 2 mg daily. A1C in April was 6.5.   HLD -  On Lipitor 10 mg daily and lisinopril 2.5 mg daily.   Elevated BP - Bp at initial check was 169/ 90. Bp at recheck was 146/78. Bp is running unchecked at home.    See problem oriented charting- ROS- ROS: Gen: no fever, chills  Skin: no rash, itching ENT: no ear pain, ear drainage, nasal congestion, rhinorrhea, sinus pressure, sore throat Eyes: no blurry vision, double vision Resp: no cough, wheeze,SOB CV: no CP, palpitations, LE edema,  GI: no heartburn, n/v/d/c, abd pain GU: no dysuria, urgency, frequency, hematuria MSK: no joint pain, myalgias, back pain Neuro: no dizziness, headache, weakness, vertigo Psych: no depression, anxiety, insomnia, SI   The following were reviewed and entered/updated in epic: Past Medical History:  Diagnosis Date   Blood transfusion without reported diagnosis at birth    mom was Rh neg and pt +   Cancer (HCC) 12/2016   ENDOMETRIAL CANCER   Cataract    Dr Meyer Russel   Diabetes mellitus without complication [facility])    TYPE 2   Genetic testing 04/07/2017   Ms. Spoerl underwent genetic counseling and testing for hereditary cancer syndromes on 03/24/2017. There were no pathogenic mutations identified in any of the 46 genes analyzed by [facility]'s 46-gene Common Hereditary Cancers Panel. Genes analyzed include: APC, ATM, AXIN2, BARD1, BMPR1A, BRCA1, BRCA2, BRIP1, CDH1, CDKN2A, CHEK2, CTNNA1, DICER1, EPCAM, GREM1, HOXB13, KIT, MEN1, MLH1, MSH2, MSH3, MSH6   Leg cramps    and feet OCC   Pneumonia YRS AGO   PONV (postoperative nausea and vomiting)    NAUSEA   Skin rash 10/21/2016   CONTACT DERMITITIS  OCC   Patient Active Problem List   Diagnosis Date Noted   Hyperlipidemia 04/13/2023   Class 1 obesity 04/13/2023   Class 2 severe obesity with serious comorbidity and body mass index (BMI) of 35.0 to 35.9 in adult [facility]) 10/04/2017   Genetic testing 04/07/2017   Endometrial cancer (HCC) 01/04/2017   Type 2 diabetes mellitus with hyperglycemia, without long-term current use of insulin (HCC) 12/06/2016   Endometrial carcinoma (HCC) 11/22/2016   Past Surgical History:  Procedure Laterality Date   ABDOMINAL HYSTERECTOMY  01/04/2017   Dr Adolphus Birchwood   COLONOSCOPY  done at AGE 47   w/Dr.Mann=normal exam   DILATATION & CURETTAGE/HYSTEROSCOPY WITH MYOSURE N/A 11/02/2016   Procedure: DILATATION & CURETTAGE/HYSTEROSCOPY WITH MYOSURE;  Surgeon: Richarda Overlie, MD;  Location: [facility];  Service: Gynecology;  Laterality: N/A;   DILATION AND CURETTAGE OF UTERUS     EYE SURGERY  04/09/22 (L) & 05/07/22 (R)   Dr Mia Creek   LYMPH NODE BIOPSY N/A 01/04/2017   Procedure: SENTINEL LYMPH NODE BIOPSY;  Surgeon: Adolphus Birchwood, MD;  Location: [facility];  Service: Gynecology;  Laterality: N/A;   MOUTH SURGERY     posts for implants   ROBOTIC ASSISTED TOTAL HYSTERECTOMY WITH BILATERAL SALPINGO OOPHERECTOMY Bilateral 01/04/2017   Procedure: XI ROBOTIC ASSISTED TOTAL HYSTERECTOMY WITH BILATERAL SALPINGO OOPHORECTOMY;  Surgeon: Adolphus Birchwood, MD;  Location: [facility];  Service: Gynecology;  Laterality: Bilateral;  TUBAL LIGATION  YRS AGO    Family History  Problem Relation Age of Onset   Heart Problems Father    COPD Father    Heart disease Father    Cervical cancer Sister 57       full sister   Cancer Sister    Kidney disease Sister    High blood pressure Son    Alcohol abuse Son    Drug abuse Son    High blood pressure Son    Alcohol abuse Son    Anxiety disorder Son    Depression Son    Drug abuse Son    Breast cancer Sister 52       maternal half sister   Melanoma Sister    Cancer Sister    Hearing  loss Sister    Bone cancer Mother 47       d.69   Cancer Mother    COPD Mother    Early death Maternal Grandmother    Stomach cancer Maternal Grandfather 74       d.60s   Cancer Maternal Grandfather    Colon cancer Paternal Grandmother 36       d.84   Cancer Paternal Grandmother    Diabetes Paternal Grandmother    Stroke Paternal Grandmother    Kidney cancer Paternal Grandfather 28       d.58   Cancer Paternal Grandfather    Varicose Veins Maternal Aunt     Medications- reviewed and updated Current Outpatient Medications  Medication Sig Dispense Refill   amoxicillin (AMOXIL) 500 MG tablet Take 500 mg by mouth 3 (three) times daily.     ASPIRIN EC LOW DOSE PO Take 81 mg by mouth daily.     atorvastatin (LIPITOR) 10 MG tablet Take 1 tablet (10 mg total) by mouth daily. 90 tablet 3   blood glucose meter kit and supplies KIT Dispense based on patient and insurance preference. Use 2x times daily as directed. (FOR ICD-9 250.00, 250.01). 1 each 0   Cholecalciferol (D3) 50 MCG (2000 UT) TABS Take 1 tablet by mouth daily.     glimepiride (AMARYL) 2 MG tablet TAKE 1 TABLET BEFORE DINNER 90 tablet 3   ibuprofen (ADVIL) 600 MG tablet Take 1,200 mg by mouth 2 (two) times daily.     JARDIANCE 25 MG TABS tablet TAKE 1 TABLET DAILY 90 tablet 3   lisinopril (ZESTRIL) 2.5 MG tablet Take 1 tablet (2.5 mg total) by mouth daily. 90 tablet 3   metFORMIN (GLUCOPHAGE-XR) 500 MG 24 hr tablet Take 1 tablet (500 mg total) by mouth 2 (two) times daily with a meal. 180 tablet 2   methylPREDNISolone (MEDROL DOSEPAK) 4 MG TBPK tablet See admin instructions. follow package directions     nystatin-triamcinolone (MYCOLOG II) cream      Current Facility-Administered Medications  Medication Dose Route Frequency Provider Last Rate Last Admin   0.9 %  sodium chloride infusion  500 mL Intravenous Once Nandigam, Eleonore Chiquito, MD        Allergies-reviewed and updated Allergies  Allergen Reactions   Flagyl  [Metronidazole] Rash    Social History   Social History Narrative   13 grands, 3 greats.   Lives w/son(disabled).   Objective  Objective:  BP (!) 146/78 (BP Location: Left Arm, Patient Position: Sitting, Cuff Size: Large)   Pulse 60   Temp 98.4 F (36.9 C) (Temporal)   Resp 16   Ht 5\' 4"  (1.626 m)   Wt 185 lb 8  oz (84.1 kg)   SpO2 97%   BMI 31.84 kg/m  Physical Exam  Gen: WDWN NAD HEENT: NCAT, conjunctiva not injected, sclera nonicteric, +sutures in gums TM WNL B, OP moist, no exudates  NECK:  supple, no thyromegaly, no nodes, no carotid bruits CARDIAC: RRR, S1S2+, no murmur. DP 2+B LUNGS: CTAB. No wheezes ABDOMEN:  BS+, soft, NTND, No HSM, no masses EXT:  no edema MSK: no gross abnormalities. MS 5/5 all 4 NEURO: A&O x3.  CN II-XII intact.  PSYCH: normal mood. Good eye contact     Assessment and Plan   Health Maintenance counseling: 1. Anticipatory guidance: Patient counseled regarding regular dental exams q6 months, eye exams,  avoiding smoking and second hand smoke, limiting alcohol to 1 beverage per day, no illicit drugs.   2. Risk factor reduction:  Advised patient of need for regular exercise and diet rich and fruits and vegetables to reduce risk of heart attack and stroke.  Exercise- Reports she is not regularly exercising..  Wt Readings from Last 3 Encounters:  09/21/23 185 lb 8 oz (84.1 kg)  04/13/23 183 lb 6.4 oz (83.2 kg)  12/07/22 178 lb 12.8 oz (81.1 kg)   3. Immunizations/screenings/ancillary studies Immunization History  Administered Date(s) Administered   Fluad Quad(high Dose 65+) 08/13/2021, 09/14/2022   Influenza, High Dose Seasonal PF 09/23/2018   Influenza,inj,Quad PF,6+ Mos 10/04/2017   Influenza-Unspecified 09/27/2019, 10/14/2021, 09/14/2022   Moderna Sars-Covid-2 Vaccination 02/14/2020, 03/14/2020, 10/16/2020   Pneumococcal-Unspecified 10/14/2016   Zoster Recombinant(Shingrix) 08/13/2021, 11/04/2021   Zoster, Live 08/13/2021, 11/10/2021    Health Maintenance Due  Topic Date Due   DTaP/Tdap/Td (1 - Tdap) Never done   DEXA SCAN  Never done   Colonoscopy  01/23/2023   MAMMOGRAM  08/19/2023   Diabetic kidney evaluation - eGFR measurement  09/15/2023   Diabetic kidney evaluation - Urine ACR  09/15/2023   Medicare Annual Wellness (AWV)  11/17/2023    4. Cervical cancer screening- see's gyn 5. Breast cancer screening-  mammogram 2022, overdue 6. Colon cancer screening - 2019, overdue 7. Skin cancer screening- advised regular sunscreen use. Denies worrisome, changing, or new skin lesions.  8. Birth control/STD check- post-menopausal, surgical  9. Osteoporosis screening- gyn to do 10. Smoking associated screening - non smoker  Type 2 diabetes mellitus with hyperglycemia, without long-term current use of insulin (HCC) Assessment & Plan: Chronic.  Better controlled.  Cont meds.  Managed by endo.  Will see end of October.   Orders: -     CBC with Differential/Platelet -     Comprehensive metabolic panel -     TSH -     Hemoglobin A1c -     Microalbumin / creatinine urine ratio -     Vitamin B12  Endometrial carcinoma (HCC) -     CBC with Differential/Platelet  Pure hypercholesterolemia Assessment & Plan: Chronic.  LDL 83.  On lipitor 10mg .  Continue-managed by endo  Orders: -     Comprehensive metabolic panel -     Lipid panel  Screening for colorectal cancer -     Ambulatory referral to Gastroenterology  Need for influenza vaccination -     Flu Vaccine Trivalent High Dose (Fluad)  Elevated blood pressure reading  Endometrial cancer (HCC) Assessment & Plan: In remission.  Seeing gyn in Dec   Elevated bp-not sure if from dental procedures and meds recently.  Monitor.  Has f/u endo end of month.  May need to increase lisinopril if contrinues to be elevated.  Recommended follow up: Return in about 1 year (around 09/20/2024) for annual physical.     sch Tina awv after 11/28.  Lab/Order associations:  fasting  Angelena Sole, MD   Germaine Pomfret Rice,acting as a scribe for Angelena Sole, MD.,have documented all relevant documentation on the behalf of Angelena Sole, MD,as directed by  Angelena Sole, MD while in the presence of Angelena Sole, MD.  I, Angelena Sole, MD, have reviewed all documentation for this visit. The documentation on 09/21/23 for the exam, diagnosis, procedures, and orders are all accurate and complete.

## 2023-09-21 NOTE — Patient Instructions (Addendum)
It was very nice to see you today!  Get Tdap from pharmacy.  Get colon set up  PLEASE NOTE:  If you had any lab tests please let us know if you have not heard back within a few days. You may see your results on MyChart before we have a chance to review them but we will give you a call once they are reviewed by [place]. If we ordered any referrals today, please let us know if you have not heard from their office within the next week.   Please try these tips to maintain a healthy lifestyle:  Eat most of your calories during the day when you are active. Eliminate processed foods including packaged sweets (pies, cakes, cookies), reduce intake of potatoes, white bread, white pasta, and white rice. Look for whole grain options, oat flour or almond flour.  Each meal should contain half fruits/vegetables, one quarter protein, and one quarter carbs (no bigger than a computer mouse).  Cut down on sweet beverages. This includes juice, soda, and sweet tea. Also watch fruit intake, though this is a healthier sweet option, it still contains natural sugar! Limit to 3 servings daily.  Drink at least 1 glass of water with each meal and aim for at least 8 glasses per day  Exercise at least 150 minutes every week.

## 2023-09-25 NOTE — Progress Notes (Signed)
Labs mostly good/stable except: 1.  WBC slightly elevated-is she sick?  Repeat 1 month 2.  Sugars - A1c higher than last time-will send copy to Dr. Elvera Lennox

## 2023-09-26 ENCOUNTER — Other Ambulatory Visit: Payer: Self-pay | Admitting: *Deleted

## 2023-09-26 DIAGNOSIS — D72829 Elevated white blood cell count, unspecified: Secondary | ICD-10-CM

## 2023-10-17 ENCOUNTER — Other Ambulatory Visit: Payer: Self-pay | Admitting: Family Medicine

## 2023-10-19 ENCOUNTER — Ambulatory Visit: Payer: Medicare Other | Admitting: Internal Medicine

## 2023-11-08 ENCOUNTER — Other Ambulatory Visit: Payer: Medicare Other

## 2023-11-14 ENCOUNTER — Ambulatory Visit (INDEPENDENT_AMBULATORY_CARE_PROVIDER_SITE_OTHER): Payer: Medicare Other | Admitting: Internal Medicine

## 2023-11-14 ENCOUNTER — Encounter: Payer: Self-pay | Admitting: Internal Medicine

## 2023-11-14 VITALS — BP 130/70 | HR 83 | Ht 64.0 in | Wt 184.4 lb

## 2023-11-14 DIAGNOSIS — Z7984 Long term (current) use of oral hypoglycemic drugs: Secondary | ICD-10-CM

## 2023-11-14 DIAGNOSIS — E1165 Type 2 diabetes mellitus with hyperglycemia: Secondary | ICD-10-CM | POA: Diagnosis not present

## 2023-11-14 DIAGNOSIS — E66811 Obesity, class 1: Secondary | ICD-10-CM

## 2023-11-14 DIAGNOSIS — E785 Hyperlipidemia, unspecified: Secondary | ICD-10-CM | POA: Diagnosis not present

## 2023-11-14 MED ORDER — METFORMIN HCL ER 500 MG PO TB24: ORAL_TABLET | ORAL | 3 refills | Status: DC

## 2023-11-14 NOTE — Progress Notes (Signed)
Patient ID: Angela Mays, female   DOB: 1951-10-05, 72 y.o.   MRN: [id number]   HPI: Angela Mays is a 72 y.o.-year-old female, returning for f/u for DM2, dx in 09/2014, non-insulin-dependent, uncontrolled, without long term complications. Last visit 7 months ago.  Interim history: No increased urination, nausea, chest pain.  Her blurry vision resolved after cataract surgeries.  Reviewed HbA1c levels: Lab Results  Component Value Date   HGBA1C 7.3 (H) 09/21/2023   HGBA1C 6.5 (A) 04/13/2023   HGBA1C 6.4 (A) 12/07/2022  11/07/2019: HbA1c: 6.9%  Pt is on: - Metformin XR 1500 >> 1000 >> 500 mg twice a day (she reduced the dose due to negative publicity) - Jardiance 25 mg before first meal of the day (on terconazole) - Glimepiride 2 >> 2-4 mg after dinner  Rybelsus was not covered.  Pt is checking sugars  times a day: - am:  98-143 >> 94-126 >> 106-127 >> 98-131, 140 - 2h after b'fast:  113-143, 163 >> 143-169 >> 141-157 - before lunch: 124, 137 >> 78, 102-137 >> 112-156, 178 - 2h after lunch: 159-197, 227>> 142, 157 >> 140-162 >> 146-187 - before dinner: 82-158 >> 84-142 >> 87-130 >> 131-152, 162 - 2h after dinner: 170-227 >> 142-181 >> 163-193 >> 162-197 - bedtime:  114-263 >> 87-148 >> 128-176 >> 151-179 - nighttime: 68, 95-139 >> 96, 121-147 >> 121-153 Lowest: 76 >> 82 >> 69 >> 78 >> 98 Highest: 263 >> 181 >> 193 >> 197  Glucometer: One Touch ultra mini  Pt's meals are: - Breakfast: oatmeal or PB sandwich or bacon and eggs or cereals or skips during the week - Lunch: sandwich or spaghetti or fast foods - Dinner: largest - meat + veggies + starch - Snacks: 2-3: less chocolate, potato chips,  M&M She rarely has sweet drinks.  -No CKD last BUN/creatinine:  Lab Results  Component Value Date   BUN 23 09/21/2023   BUN 14 09/14/2022   CREATININE 0.77 09/21/2023   CREATININE 0.80 09/14/2022   Lab Results  Component Value Date   MICRALBCREAT 0.9 09/21/2023   MICRALBCREAT  0.9 09/14/2022   MICRALBCREAT 0.8 07/03/2018  She is on low-dose lisinopril, 2.5 mg daily.  -+ HL; last set of lipids: Lab Results  Component Value Date   CHOL 168 09/21/2023   HDL 66.10 09/21/2023   LDLCALC 89 09/21/2023   TRIG 67.0 09/21/2023   CHOLHDL 3 09/21/2023  09/07/2021: 152/72/51/87 09/03/2020: 146/63/50/79 11/07/2019: 162/58/57/94 03/29/2017: 193/56/63/119 On atorvastatin 10 mg daily  - last eye exam was 05/18/2023:+ DR, + Cataract, no DR. On Ocuvite.  She had cataract sx 04 and 04/2022.  - no numbness and tingling in her feet.  Last foot exam 12/07/2022.  She has a history of endometrial cancer and had hysterectomy. She does not have a ho pancreatitis or FH of MTC. She was laid off from her job in 06/2019.  ROS: + see HPI  I reviewed pt's medications, allergies, PMH, social hx, family hx, and changes were documented in the history of present illness. Otherwise, unchanged from my initial visit note.  Past Medical History:  Diagnosis Date   Blood transfusion without reported diagnosis at birth    mom was Rh neg and pt +   Cancer (HCC) 12/2016   ENDOMETRIAL CANCER   Cataract    Dr Meyer Russel   Diabetes mellitus without complication [facility])    TYPE 2   Genetic testing 04/07/2017   Ms. Klepacki underwent genetic counseling  and testing for hereditary cancer syndromes on 03/24/2017. There were no pathogenic mutations identified in any of the 46 genes analyzed by [facility]'s 46-gene Common Hereditary Cancers Panel. Genes analyzed include: APC, ATM, AXIN2, BARD1, BMPR1A, BRCA1, BRCA2, BRIP1, CDH1, CDKN2A, CHEK2, CTNNA1, DICER1, EPCAM, GREM1, HOXB13, KIT, MEN1, MLH1, MSH2, MSH3, MSH6   Leg cramps    and feet OCC   Pneumonia YRS AGO   PONV (postoperative nausea and vomiting)    NAUSEA   Skin rash 10/21/2016   CONTACT DERMITITIS OCC   Past Surgical History:  Procedure Laterality Date   ABDOMINAL HYSTERECTOMY  01/04/2017   Dr Adolphus Birchwood   COLONOSCOPY  done at AGE 64    w/Dr.Mann=normal exam   DILATATION & CURETTAGE/HYSTEROSCOPY WITH MYOSURE N/A 11/02/2016   Procedure: DILATATION & CURETTAGE/HYSTEROSCOPY WITH MYOSURE;  Surgeon: Richarda Overlie, MD;  Location: [facility];  Service: Gynecology;  Laterality: N/A;   DILATION AND CURETTAGE OF UTERUS     EYE SURGERY  04/09/22 (L) & 05/07/22 (R)   Dr Mia Creek   LYMPH NODE BIOPSY N/A 01/04/2017   Procedure: SENTINEL LYMPH NODE BIOPSY;  Surgeon: Adolphus Birchwood, MD;  Location: [facility];  Service: Gynecology;  Laterality: N/A;   MOUTH SURGERY     posts for implants   ROBOTIC ASSISTED TOTAL HYSTERECTOMY WITH BILATERAL SALPINGO OOPHERECTOMY Bilateral 01/04/2017   Procedure: XI ROBOTIC ASSISTED TOTAL HYSTERECTOMY WITH BILATERAL SALPINGO OOPHORECTOMY;  Surgeon: Adolphus Birchwood, MD;  Location: [facility];  Service: Gynecology;  Laterality: Bilateral;   TUBAL LIGATION  YRS AGO   Social History   Social History   Marital status: Single    Spouse name: N/A   Number of children: 2   Occupational History   Print production planner    Social History Main Topics   Smoking status: Never Smoker   Smokeless tobacco: Never Used   Alcohol use No   Drug use: No   Current Outpatient Medications on File Prior to Visit  Medication Sig Dispense Refill   amoxicillin (AMOXIL) 500 MG tablet Take 500 mg by mouth 3 (three) times daily.     ASPIRIN EC LOW DOSE PO Take 81 mg by mouth daily.     atorvastatin (LIPITOR) 10 MG tablet TAKE 1 TABLET DAILY 90 tablet 3   blood glucose meter kit and supplies KIT Dispense based on patient and insurance preference. Use 2x times daily as directed. (FOR ICD-9 250.00, 250.01). 1 each 0   Cholecalciferol (D3) 50 MCG (2000 UT) TABS Take 1 tablet by mouth daily.     glimepiride (AMARYL) 2 MG tablet TAKE 1 TABLET BEFORE DINNER 90 tablet 3   ibuprofen (ADVIL) 600 MG tablet Take 1,200 mg by mouth 2 (two) times daily.     JARDIANCE 25 MG TABS tablet TAKE 1 TABLET DAILY 90 tablet 3   lisinopril (ZESTRIL) 2.5 MG tablet TAKE 1  TABLET DAILY 90 tablet 3   metFORMIN (GLUCOPHAGE-XR) 500 MG 24 hr tablet Take 1 tablet (500 mg total) by mouth 2 (two) times daily with a meal. 180 tablet 2   methylPREDNISolone (MEDROL DOSEPAK) 4 MG TBPK tablet See admin instructions. follow package directions     nystatin-triamcinolone (MYCOLOG II) cream      Current Facility-Administered Medications on File Prior to Visit  Medication Dose Route Frequency Provider Last Rate Last Admin   0.9 %  sodium chloride infusion  500 mL Intravenous Once Nandigam, Eleonore Chiquito, MD       Allergies  Allergen Reactions   Flagyl [Metronidazole]  Rash   Family History  Problem Relation Age of Onset   Heart Problems Father    COPD Father    Heart disease Father    Cervical cancer Sister 24       full sister   Cancer Sister    Kidney disease Sister    High blood pressure Son    Alcohol abuse Son    Drug abuse Son    High blood pressure Son    Alcohol abuse Son    Anxiety disorder Son    Depression Son    Drug abuse Son    Breast cancer Sister 39       maternal half sister   Melanoma Sister    Cancer Sister    Hearing loss Sister    Bone cancer Mother 55       d.69   Cancer Mother    COPD Mother    Early death Maternal Grandmother    Stomach cancer Maternal Grandfather 19       d.60s   Cancer Maternal Grandfather    Colon cancer Paternal Grandmother 35       d.84   Cancer Paternal Grandmother    Diabetes Paternal Grandmother    Stroke Paternal Grandmother    Kidney cancer Paternal Grandfather 32       d.58   Cancer Paternal Grandfather    Varicose Veins Maternal Aunt    PE: BP 130/70   Pulse 83   Ht 5\' 4"  (1.626 m)   Wt 184 lb 6.4 oz (83.6 kg)   SpO2 97%   BMI 31.65 kg/m  Wt Readings from Last 3 Encounters:  11/14/23 184 lb 6.4 oz (83.6 kg)  09/21/23 185 lb 8 oz (84.1 kg)  04/13/23 183 lb 6.4 oz (83.2 kg)   Constitutional: overweight, in NAD Eyes:  EOMI, no exophthalmos ENT: no neck masses, no cervical  lymphadenopathy Cardiovascular: RRR, No MRG Respiratory: CTA B Musculoskeletal: no deformities Skin:no rashes Neurological: no tremor with outstretched hands Diabetic Foot Exam - Simple   Simple Foot Form Diabetic Foot exam was performed with the following findings: Yes 11/14/2023  1:30 PM  Visual Inspection No deformities, no ulcerations, no other skin breakdown bilaterally: Yes Sensation Testing Intact to touch and monofilament testing bilaterally: Yes Pulse Check Posterior Tibialis and Dorsalis pulse intact bilaterally: Yes Comments Toenails indurated, thick    ASSESSMENT: 1. DM2, non-insulin-dependent, uncontrolled, with long term complications - DR  2. Obesity class 1  3. HL  PLAN:  1. Patient with history of uncontrolled/2 diabetes, with improved control after she started to improve her diet, reducing sweet drinks, and adding Jardiance.  At last visit, HbA1c was 6.5%, but she had another HbA1c that was higher, at 7.3%, last month. -At last visit, she reduced her metformin due to negative publicity.  We discussed that the concerns are unfounded.  However, since the sugars appear to be at goal with only occasional mild hyperglycemic exceptions, we continued with a submaximal dose of metformin.  I also advised her to continue Jardiance and take 2 tablets of glimepiride before a larger meal. -At today's visit sugars appear to be slightly higher than before, as she mentions that she relaxed her diet and was eating more sweets.  She would be interested in increasing the dose of metformin that if needed.  I advised her to increase the dose in the morning to 2 tablets.  Will continue 1 tablet at night.  For now, we can continue Jardiance and  glimepiride and I again advised her to take 1 to 2 tablets depending on the size of her nurse. - I suggested to:  Patient Instructions  Please increase: - Metformin XR 1000 mg in am and 500 mg at night.  Continue: - Jardiance 25 mg before first  meal of the day  - Glimepiride 2-4 mg 15-30 min before dinner  Please return in 4 months with your sugar log.   - advised to check sugars at different times of the day - 1x a day, rotating check times - advised for yearly eye exams >> she is UTD - return to clinic in 4 months  2. Obesity class 1 -We discussed in the past about eliminating sweet tea and juice-only occasionally drinking this now -Will continue Jardiance which should also help with weight loss -before last visit she gained 5 pounds.  Since last visit, she gained 1 pound.  3. HL -Lipid panel from 09/2023 was reviewed: Fractions at goal with the exception of an LDL higher than our goal of less than 70: Lab Results  Component Value Date   CHOL 168 09/21/2023   HDL 66.10 09/21/2023   LDLCALC 89 09/21/2023   TRIG 67.0 09/21/2023   CHOLHDL 3 09/21/2023  -Will continue atorvastatin 10 mg daily.  No side effects.  Carlus Pavlov, MD PhD [facility] Endocrinology

## 2023-11-14 NOTE — Patient Instructions (Addendum)
Please increase: - Metformin XR 1000 mg in am and 500 mg at night.  Continue: - Jardiance 25 mg before first meal of the day  - Glimepiride 2-4 mg 15-30 min before dinner  Please return in 4 months with your sugar log.

## 2023-11-22 ENCOUNTER — Other Ambulatory Visit: Payer: Medicare Other

## 2023-12-05 DIAGNOSIS — L293 Anogenital pruritus, unspecified: Secondary | ICD-10-CM | POA: Diagnosis not present

## 2023-12-05 DIAGNOSIS — Z6832 Body mass index (BMI) 32.0-32.9, adult: Secondary | ICD-10-CM | POA: Diagnosis not present

## 2023-12-05 DIAGNOSIS — Z01419 Encounter for gynecological examination (general) (routine) without abnormal findings: Secondary | ICD-10-CM | POA: Diagnosis not present

## 2023-12-05 DIAGNOSIS — Z1231 Encounter for screening mammogram for malignant neoplasm of breast: Secondary | ICD-10-CM | POA: Diagnosis not present

## 2023-12-05 LAB — HM MAMMOGRAPHY

## 2024-01-18 ENCOUNTER — Ambulatory Visit (INDEPENDENT_AMBULATORY_CARE_PROVIDER_SITE_OTHER): Payer: Medicare Other

## 2024-01-18 VITALS — Ht 63.0 in | Wt 184.0 lb

## 2024-01-18 DIAGNOSIS — Z1211 Encounter for screening for malignant neoplasm of colon: Secondary | ICD-10-CM | POA: Diagnosis not present

## 2024-01-18 DIAGNOSIS — Z780 Asymptomatic menopausal state: Secondary | ICD-10-CM

## 2024-01-18 DIAGNOSIS — Z0000 Encounter for general adult medical examination without abnormal findings: Secondary | ICD-10-CM | POA: Diagnosis not present

## 2024-01-18 NOTE — Patient Instructions (Signed)
Angela Mays , Thank you for taking time to come for your Medicare Wellness Visit. I appreciate your ongoing commitment to your health goals. Please review the following plan we discussed and let me know if I can assist you in the future.   Referrals/Orders/Follow-Ups/Clinician Recommendations:  Next Medicare Annual Wellness Visit: January 21, 2025 at 3:40 pm virtual visit  You have an order for:  []   2D Mammogram  []   3D Mammogram  [x]   Bone Density   []   Lung Cancer Screening  Please call for appointment:   [facility] Address: [address], [place], [place] [address] Phone: [phone]  Make sure to wear two-piece clothing.  No lotions powders or deodorants the day of the appointment Make sure to bring picture ID and insurance card.  Bring list of medications you are currently taking including any supplements.   Schedule your [place] screening mammogram through MyChart!   Log into your MyChart account.  Go to 'Visit' (or 'Appointments' if on mobile App) --> Schedule an Appointment  Under 'Select a Reason for Visit' choose the Mammogram Screening option.  Complete the pre-visit questions and select the time and place that best fits your schedule.  A referral has been placed for you to have a screening colonoscopy. Please call the number below to schedule that appointment if you haven't heard from them in 7-10 business days:  [facility] [address] [place] [address] [place],  [place]  [address] Main: [phone]    This is a list of the screening recommended for you and due dates:  Health Maintenance  Topic Date Due   Pneumonia Vaccine (1 of 2 - PCV) 02/01/1957   DTaP/Tdap/Td vaccine (1 - Tdap) Never done   DEXA scan (bone density measurement)  06/03/2021   Colon Cancer Screening  01/23/2023   Mammogram  11/26/2023   Hemoglobin A1C  03/21/2024   Eye exam for diabetics  05/17/2024   Yearly kidney function blood test for  diabetes  09/20/2024   Yearly kidney health urinalysis for diabetes  09/20/2024   Complete foot exam   11/13/2024   Medicare Annual Wellness Visit  01/17/2025   Flu Shot  Completed   COVID-19 Vaccine  Completed   Hepatitis C Screening  Completed   Zoster (Shingles) Vaccine  Completed   HPV Vaccine  Aged Out    Advanced directives: (ACP Link)Information on Advanced Care Planning can be found at [facility] [place] Advance Health Care Directives Advance Health Care Directives ([email or web address]   Next Medicare Annual Wellness Visit scheduled for next year: yes  Preventive Care 65 Years and Older, Female Preventive care refers to lifestyle choices and visits with your health care provider that can promote health and wellness. Preventive care visits are also called wellness exams. What can I expect for my preventive care visit? Counseling Your health care provider may ask you questions about your: Medical history, including: Past medical problems. Family medical history. Pregnancy and menstrual history. History of falls. Current health, including: Memory and ability to understand (cognition). Emotional well-being. Home life and relationship well-being. Sexual activity and sexual health. Lifestyle, including: Alcohol, nicotine or tobacco, and drug use. Access to firearms. Diet, exercise, and sleep habits. Work and work Astronomer. Sunscreen use. Safety issues such as seatbelt and bike helmet use. Physical exam Your health care provider will check your: Height and weight. These may be used to calculate your BMI (body mass index). BMI is a measurement  that tells if you are at a healthy weight. Waist circumference. This measures the distance around your waistline. This measurement also tells if you are at a healthy weight and may help predict your risk of certain diseases, such as type 2 diabetes and high blood pressure. Heart rate and blood pressure. Body temperature. Skin  for abnormal spots. What immunizations do I need?  Vaccines are usually given at various ages, according to a schedule. Your health care provider will recommend vaccines for you based on your age, medical history, and lifestyle or other factors, such as travel or where you work. What tests do I need? Screening Your health care provider may recommend screening tests for certain conditions. This may include: Lipid and cholesterol levels. Hepatitis C test. Hepatitis B test. HIV (human immunodeficiency virus) test. STI (sexually transmitted infection) testing, if you are at risk. Lung cancer screening. Colorectal cancer screening. Diabetes screening. This is done by checking your blood sugar (glucose) after you have not eaten for a while (fasting). Mammogram. Talk with your health care provider about how often you should have regular mammograms. BRCA-related cancer screening. This may be done if you have a family history of breast, ovarian, tubal, or peritoneal cancers. Bone density scan. This is done to screen for osteoporosis. Talk with your health care provider about your test results, treatment options, and if necessary, the need for more tests. Follow these instructions at home: Eating and drinking  Eat a diet that includes fresh fruits and vegetables, whole grains, lean protein, and low-fat dairy products. Limit your intake of foods with high amounts of sugar, saturated fats, and salt. Take vitamin and mineral supplements as recommended by your health care provider. Do not drink alcohol if your health care provider tells you not to drink. If you drink alcohol: Limit how much you have to 0-1 drink a day. Know how much alcohol is in your drink. In the [place] one drink equals one 12 oz bottle of beer (355 mL), one 5 oz glass of wine (148 mL), or one 1 oz glass of hard liquor (44 mL). Lifestyle Brush your teeth every morning and night with fluoride toothpaste. Floss one time each  day. Exercise for at least 30 minutes 5 or more days each week. Do not use any products that contain nicotine or tobacco. These products include cigarettes, chewing tobacco, and vaping devices, such as e-cigarettes. If you need help quitting, ask your health care provider. Do not use drugs. If you are sexually active, practice safe sex. Use a condom or other form of protection in order to prevent STIs. Take aspirin only as told by your health care provider. Make sure that you understand how much to take and what form to take. Work with your health care provider to find out whether it is safe and beneficial for you to take aspirin daily. Ask your health care provider if you need to take a cholesterol-lowering medicine (statin). Find healthy ways to manage stress, such as: Meditation, yoga, or listening to music. Journaling. Talking to a trusted person. Spending time with friends and family. Minimize exposure to UV radiation to reduce your risk of skin cancer. Safety Always wear your seat belt while driving or riding in a vehicle. Do not drive: If you have been drinking alcohol. Do not ride with someone who has been drinking. When you are tired or distracted. While texting. If you have been using any mind-altering substances or drugs. Wear a helmet and other protective equipment during sports  activities. If you have firearms in your house, make sure you follow all gun safety procedures. What's next? Visit your health care provider once a year for an annual wellness visit. Ask your health care provider how often you should have your eyes and teeth checked. Stay up to date on all vaccines. This information is not intended to replace advice given to you by your health care provider. Make sure you discuss any questions you have with your health care provider. Document Revised: 06/03/2021 Document Reviewed: 06/03/2021 Elsevier Patient Education  2024 [employer].  Understanding Your Risk for  Falls Millions of people have serious injuries from falls each year. It is important to understand your risk of falling. Talk with your health care provider about your risk and what you can do to lower it. If you do have a serious fall, make sure to tell your provider. Falling once raises your risk of falling again. How can falls affect me? Serious injuries from falls are common. These include: Broken bones, such as hip fractures. Head injuries, such as traumatic brain injuries (TBI) or concussions. A fear of falling can cause you to avoid activities and stay at home. This can make your muscles weaker and raise your risk for a fall. What can increase my risk? There are a number of risk factors that increase your risk for falling. The more risk factors you have, the higher your risk of falling. Serious injuries from a fall happen most often to people who are older than 74 years old. Teenagers and young adults ages 5-29 are also at higher risk. Common risk factors include: Weakness in the lower body. Being generally weak or confused due to long-term (chronic) illness. Dizziness or balance problems. Poor vision. Medicines that cause dizziness or drowsiness. These may include: Medicines for your blood pressure, heart, anxiety, insomnia, or swelling (edema). Pain medicines. Muscle relaxants. Other risk factors include: Drinking alcohol. Having had a fall in the past. Having foot pain or wearing improper footwear. Working at a dangerous job. Having any of the following in your home: Tripping hazards, such as floor clutter or loose rugs. Poor lighting. Pets. Having dementia or memory loss. What actions can I take to lower my risk of falling?     Physical activity Stay physically fit. Do strength and balance exercises. Consider taking a regular class to build strength and balance. Yoga and tai chi are good options. Vision Have your eyes checked every year and your prescription for  glasses or contacts updated as needed. Shoes and walking aids Wear non-skid shoes. Wear shoes that have rubber soles and low heels. Do not wear high heels. Do not walk around the house in socks or slippers. Use a cane or walker as told by your provider. Home safety Attach secure railings on both sides of your stairs. Install grab bars for your bathtub, shower, and toilet. Use a non-skid mat in your bathtub or shower. Attach bath mats securely with double-sided, non-slip rug tape. Use good lighting in all rooms. Keep a flashlight near your bed. Make sure there is a clear path from your bed to the bathroom. Use night-lights. Do not use throw rugs. Make sure all carpeting is taped or tacked down securely. Remove all clutter from walkways and stairways, including extension cords. Repair uneven or broken steps and floors. Avoid walking on icy or slippery surfaces. Walk on the grass instead of on icy or slick sidewalks. Use ice melter to get rid of ice on walkways in the  winter. Use a cordless phone. Questions to ask your health care provider Can you help me check my risk for a fall? Do any of my medicines make me more likely to fall? Should I take a vitamin D supplement? What exercises can I do to improve my strength and balance? Should I make an appointment to have my vision checked? Do I need a bone density test to check for weak bones (osteoporosis)? Would it help to use a cane or a walker? Where to find more information Centers for Disease Control and Prevention, [employer]AD[employer]: TonerPromos.no Community-Based Fall Prevention Programs: TonerPromos.no [employer] on Aging: BaseRingTones.pl Contact a health care provider if: You fall at home. You are afraid of falling at home. You feel weak, drowsy, or dizzy. This information is not intended to replace advice given to you by your health care provider. Make sure you discuss any questions you have with your health care provider. Document Revised: 08/09/2022  Document Reviewed: 08/09/2022 Elsevier Patient Education  2024 [employer].

## 2024-01-18 NOTE — Progress Notes (Signed)
Because this visit was a virtual/telehealth visit,  certain criteria was not obtained, such a blood pressure, CBG if applicable, and timed get up and go. Any medications not marked as "taking" were not mentioned during the medication reconciliation part of the visit. Any vitals not documented were not able to be obtained due to this being a telehealth visit or patient was unable to self-report a recent blood pressure reading due to a lack of equipment at home via telehealth. Vitals that have been documented are verbally provided by the patient.  Interactive audio and video telecommunications were attempted between this provider and patient, however failed, due to patient having technical difficulties OR patient did not have access to video capability.  We continued and completed visit with audio only.  Subjective:   Angela Mays is a 73 y.o. female who presents for Medicare Annual (Subsequent) preventive examination.  Visit Complete: Virtual I connected with  Angela Mays on 01/18/24 by a video and audio enabled telemedicine application and verified that I am speaking with the correct person using two identifiers.  Patient Location: Other:  car  Provider Location: Home Office  I discussed the limitations of evaluation and management by telemedicine. The patient expressed understanding and agreed to proceed.  Vital Signs: Because this visit was a virtual/telehealth visit, some criteria may be missing or patient reported. Any vitals not documented were not able to be obtained and vitals that have been documented are patient reported.  Patient Medicare AWV questionnaire was completed by the patient on 01/18/2024; I have confirmed that all information answered by patient is correct and no changes since this date.  Cardiac Risk Factors include: advanced age (>39men, >24 women);diabetes mellitus;dyslipidemia;hypertension;obesity (BMI >30kg/m2);sedentary lifestyle     Objective:    Today's  Vitals   01/18/24 1629  Weight: 184 lb (83.5 kg)  Height: 5\' 3"  (1.6 m)   Body mass index is 32.59 kg/m.     11/16/2022    1:51 PM 09/01/2018    3:48 PM 02/13/2018    1:52 PM 01/28/2017    2:10 PM 01/04/2017   11:41 AM 01/04/2017   11:32 AM 01/04/2017    5:40 AM  Advanced Directives  Does Patient Have a Medical Advance Directive? Yes Yes Yes Yes  Yes Yes  Type of Estate agent of [place];Living will Healthcare Power of [place];Living will Healthcare Power of [place];Living will Healthcare Power of [place];Living will Living will;Healthcare Power of [employer] Power of [place];Living will Healthcare Power of [place];Living will  Does patient want to make changes to medical advance directive?     No - Patient declined    Copy of Healthcare Power of Attorney in Chart? No - copy requested No - copy requested No - copy requested No - copy requested  No - copy requested No - copy requested    Current Medications (verified) Outpatient Encounter Medications as of 01/18/2024  Medication Sig   ASPIRIN EC LOW DOSE PO Take 81 mg by mouth daily.   atorvastatin (LIPITOR) 10 MG tablet TAKE 1 TABLET DAILY   blood glucose meter kit and supplies KIT Dispense based on patient and insurance preference. Use 2x times daily as directed. (FOR ICD-9 250.00, 250.01).   Cholecalciferol (D3) 50 MCG (2000 UT) TABS Take 1 tablet by mouth daily.   glimepiride (AMARYL) 2 MG tablet TAKE 1 TABLET BEFORE DINNER   JARDIANCE 25 MG TABS tablet TAKE 1 TABLET DAILY   lisinopril (ZESTRIL) 2.5 MG tablet TAKE 1 TABLET  DAILY   metFORMIN (GLUCOPHAGE-XR) 500 MG 24 hr tablet Take by mouth 1000 mg in am and 500 mg in pm   nystatin-triamcinolone (MYCOLOG II) cream    amoxicillin (AMOXIL) 500 MG tablet Take 500 mg by mouth 3 (three) times daily. (Patient not taking: Reported on 11/14/2023)   ibuprofen (ADVIL) 600 MG tablet Take 1,200 mg by mouth 2 (two) times daily. (Patient not taking: Reported on  11/14/2023)   methylPREDNISolone (MEDROL DOSEPAK) 4 MG TBPK tablet See admin instructions. follow package directions (Patient not taking: Reported on 11/14/2023)   Facility-Administered Encounter Medications as of 01/18/2024  Medication   0.9 %  sodium chloride infusion    Allergies (verified) Flagyl [metronidazole]   History: Past Medical History:  Diagnosis Date   Allergy Early 80's   Rash - Flagyl   Blood transfusion without reported diagnosis at birth    mom was Rh neg and pt +   Cancer (HCC) 12/2016   ENDOMETRIAL CANCER   Cataract    Dr Meyer Russel   Diabetes mellitus without complication [facility])    TYPE 2   Genetic testing 04/07/2017   Ms. Perz underwent genetic counseling and testing for hereditary cancer syndromes on 03/24/2017. There were no pathogenic mutations identified in any of the 46 genes analyzed by [facility]'s 46-gene Common Hereditary Cancers Panel. Genes analyzed include: APC, ATM, AXIN2, BARD1, BMPR1A, BRCA1, BRCA2, BRIP1, CDH1, CDKN2A, CHEK2, CTNNA1, DICER1, EPCAM, GREM1, HOXB13, KIT, MEN1, MLH1, MSH2, MSH3, MSH6   Leg cramps    and feet OCC   Pneumonia YRS AGO   PONV (postoperative nausea and vomiting)    NAUSEA   Skin rash 10/21/2016   CONTACT DERMITITIS OCC   Past Surgical History:  Procedure Laterality Date   ABDOMINAL HYSTERECTOMY  01/04/2017   Dr Adolphus Birchwood   COLONOSCOPY  done at AGE 8   w/Dr.Mann=normal exam   DILATATION & CURETTAGE/HYSTEROSCOPY WITH MYOSURE N/A 11/02/2016   Procedure: DILATATION & CURETTAGE/HYSTEROSCOPY WITH MYOSURE;  Surgeon: Richarda Overlie, MD;  Location: [facility];  Service: Gynecology;  Laterality: N/A;   DILATION AND CURETTAGE OF UTERUS     EYE SURGERY  04/09/22 (L) & 05/07/22 (R)   Dr Mia Creek   LYMPH NODE BIOPSY N/A 01/04/2017   Procedure: SENTINEL LYMPH NODE BIOPSY;  Surgeon: Adolphus Birchwood, MD;  Location: [facility];  Service: Gynecology;  Laterality: N/A;   MOUTH SURGERY     posts for implants   ROBOTIC ASSISTED TOTAL  HYSTERECTOMY WITH BILATERAL SALPINGO OOPHERECTOMY Bilateral 01/04/2017   Procedure: XI ROBOTIC ASSISTED TOTAL HYSTERECTOMY WITH BILATERAL SALPINGO OOPHORECTOMY;  Surgeon: Adolphus Birchwood, MD;  Location: [facility];  Service: Gynecology;  Laterality: Bilateral;   TUBAL LIGATION  YRS AGO   Family History  Problem Relation Age of Onset   Heart Problems Father    COPD Father    Heart disease Father    Cervical cancer Sister 11       full sister   Cancer Sister    Kidney disease Sister    High blood pressure Son    Alcohol abuse Son    Drug abuse Son    COPD Son    High blood pressure Son    Alcohol abuse Son    Anxiety disorder Son    Depression Son    Drug abuse Son    Breast cancer Sister 41       maternal half sister   Melanoma Sister    Cancer Sister    Hearing loss Sister  Bone cancer Mother 80       d.69   Cancer Mother    COPD Mother    Early death Maternal Grandmother    Stomach cancer Maternal Grandfather 44       d.60s   Cancer Maternal Grandfather    Colon cancer Paternal Grandmother 68       d.84   Cancer Paternal Grandmother    Diabetes Paternal Grandmother    Stroke Paternal Grandmother    Kidney cancer Paternal Grandfather 37       d.58   Cancer Paternal Grandfather    Varicose Veins Maternal Aunt    Social History   Socioeconomic History   Marital status: Widowed    Spouse name: Not on file   Number of children: 2   Years of education: Not on file   Highest education level: 12th grade  Occupational History   Not on file  Tobacco Use   Smoking status: Never   Smokeless tobacco: Never  Vaping Use   Vaping status: Never Used  Substance and Sexual Activity   Alcohol use: No   Drug use: No   Sexual activity: Not Currently    Birth control/protection: Post-menopausal, Surgical  Other Topics Concern   Not on file  Social History Narrative   13 grands, 3 greats.   Lives w/son(disabled).   Social Drivers of Corporate investment banker Strain: Low  Risk  (01/18/2024)   Overall Financial Resource Strain (CARDIA)    Difficulty of Paying Living Expenses: Not very hard  Food Insecurity: No Food Insecurity (01/18/2024)   Hunger Vital Sign    Worried About Running Out of Food in the Last Year: Never true    Ran Out of Food in the Last Year: Never true  Transportation Needs: No Transportation Needs (01/18/2024)   PRAPARE - Administrator, Civil Service (Medical): No    Lack of Transportation (Non-Medical): No  Physical Activity: Inactive (01/18/2024)   Exercise Vital Sign    Days of Exercise per Week: 0 days    Minutes of Exercise per Session: 0 min  Stress: No Stress Concern Present (01/18/2024)   [employer] of Occupational Health - Occupational Stress Questionnaire    Feeling of Stress : Only a little  Social Connections: Moderately Isolated (01/18/2024)   Social Connection and Isolation Panel [NHANES]    Frequency of Communication with Friends and Family: More than three times a week    Frequency of Social Gatherings with Friends and Family: Once a week    Attends Religious Services: More than 4 times per year    Active Member of [employer] or Organizations: No    Attends Banker Meetings: Never    Marital Status: Widowed    Tobacco Counseling Counseling given: Yes   Clinical Intake:  Pre-visit preparation completed: No  Pain : No/denies pain     BMI - recorded: 32.59 Nutritional Status: BMI > 30  Obese Nutritional Risks: None Diabetes: Yes CBG done?: No Did pt. bring in CBG monitor from home?: No  How often do you need to have someone help you when you read instructions, pamphlets, or other written materials from your doctor or pharmacy?: 1 - Never  Interpreter Needed?: No  Information entered by :: Maryjean Ka CMA   Activities of Daily Living    01/18/2024    4:35 PM  In your present state of health, do you have any difficulty performing the following activities:  Hearing? 0  Vision? 0   Difficulty concentrating or making decisions? 0  Walking or climbing stairs? 0  Dressing or bathing? 0  Doing errands, shopping? 0  Preparing Food and eating ? N  Using the Toilet? N  In the past six months, have you accidently leaked urine? N  Do you have problems with loss of bowel control? N  Managing your Medications? N  Managing your Finances? N  Housekeeping or managing your Housekeeping? N    Patient Care Team: Jeani Sow, MD as PCP - General (Family Medicine)  Indicate any recent Medical Services you may have received from other than [facility] providers in the past year (date may be approximate).     Assessment:   This is a routine wellness examination for Angela Mays.  Hearing/Vision screen Hearing Screening - Comments:: Patient denies any hearing difficulties.   Vision Screening - Comments:: Wears rx glasses - up to date with routine eye exams  [facility]   Goals Addressed   None    Depression Screen    01/18/2024    4:39 PM 09/21/2023    8:58 AM 11/16/2022    1:49 PM 09/14/2022    9:06 AM 09/14/2022    9:04 AM  PHQ 2/9 Scores  PHQ - 2 Score 0 0 0 0 0  PHQ- 9 Score 0 0  0     Fall Risk    01/18/2024    4:35 PM 09/21/2023    8:57 AM 11/16/2022    1:53 PM 09/14/2022    9:01 AM  Fall Risk   Falls in the past year? 0 0 0 1  Number falls in past yr: 0 0 0 0  Injury with Fall? 0 0 0 0  Risk for fall due to : No Fall Risks No Fall Risks  Impaired balance/gait  Follow up Falls prevention discussed Falls evaluation completed Falls prevention discussed Education provided;Falls prevention discussed    MEDICARE RISK AT HOME: Medicare Risk at Home Any stairs in or around the home?: No If so, are there any without handrails?: No Home free of loose throw rugs in walkways, pet beds, electrical cords, etc?: Yes Adequate lighting in your home to reduce risk of falls?: Yes Life alert?: No Use of a cane, walker or w/c?: No Grab bars in the bathroom?:  Yes Shower chair or bench in shower?: Yes Elevated toilet seat or a handicapped toilet?: Yes  TIMED UP AND GO:  Was the test performed?  No    Cognitive Function:        01/18/2024    4:32 PM 11/16/2022    1:53 PM  6CIT Screen  What Year? 0 points 0 points  What month? 0 points 0 points  What time? 0 points 0 points  Count back from 20 0 points 0 points  Months in reverse 0 points 0 points  Repeat phrase 0 points 0 points  Total Score 0 points 0 points    Immunizations Immunization History  Administered Date(s) Administered   Fluad Quad(high Dose 65+) 08/13/2021, 09/14/2022   Fluad Trivalent(High Dose 65+) 09/21/2023   Influenza, High Dose Seasonal PF 09/23/2018   Influenza,inj,Quad PF,6+ Mos 10/04/2017   Influenza-Unspecified 09/27/2019, 10/14/2021, 09/14/2022   Moderna Sars-Covid-2 Vaccination 02/14/2020, 03/14/2020, 10/16/2020   Pneumococcal-Unspecified 10/14/2016   Zoster Recombinant(Shingrix) 08/13/2021, 11/04/2021   Zoster, Live 08/13/2021, 11/10/2021    TDAP status: Due, Education has been provided regarding the importance of this vaccine. Advised may receive this vaccine at local pharmacy or  Health Dept. Aware to provide a copy of the vaccination record if obtained from local pharmacy or Health Dept. Verbalized acceptance and understanding.  Flu Vaccine status: Up to date  Pneumococcal vaccine status: Due, Education has been provided regarding the importance of this vaccine. Advised may receive this vaccine at local pharmacy or Health Dept. Aware to provide a copy of the vaccination record if obtained from local pharmacy or Health Dept. Verbalized acceptance and understanding.  Covid-19 vaccine status: Completed vaccines  Qualifies for Shingles Vaccine? No   Zostavax completed Yes   Shingrix Completed?: Yes  Screening Tests Health Maintenance  Topic Date Due   Pneumonia Vaccine 81+ Years old (1 of 2 - PCV) 02/01/1957   DTaP/Tdap/Td (1 - Tdap) Never done    DEXA SCAN  06/03/2021   Colonoscopy  01/23/2023   Medicare Annual Wellness (AWV)  11/17/2023   MAMMOGRAM  11/26/2023   HEMOGLOBIN A1C  03/21/2024   OPHTHALMOLOGY EXAM  05/17/2024   Diabetic kidney evaluation - eGFR measurement  09/20/2024   Diabetic kidney evaluation - Urine ACR  09/20/2024   FOOT EXAM  11/13/2024   INFLUENZA VACCINE  Completed   COVID-19 Vaccine  Completed   Hepatitis C Screening  Completed   Zoster Vaccines- Shingrix  Completed   HPV VACCINES  Aged Out    Health Maintenance  Health Maintenance Due  Topic Date Due   Pneumonia Vaccine 81+ Years old (1 of 2 - PCV) 02/01/1957   DTaP/Tdap/Td (1 - Tdap) Never done   DEXA SCAN  06/03/2021   Colonoscopy  01/23/2023   Medicare Annual Wellness (AWV)  11/17/2023   MAMMOGRAM  11/26/2023    Colorectal cancer screening: Referral to GI placed 01/18/2024. Pt aware the office will call re: appt.  Mammogram Status: Results requested from [facility]  Bone Density status: Ordered 01/18/2024. Pt provided with contact info and advised to call to schedule appt.  Lung Cancer Screening: (Low Dose CT Chest recommended if Age 20-80 years, 20 pack-year currently smoking OR have quit w/in 15years.) does not qualify.   Lung Cancer Screening Referral: na  Additional Screening:  Hepatitis C Screening: does not qualify; Completed   Vision Screening: Recommended annual ophthalmology exams for early detection of glaucoma and other disorders of the eye. Is the patient up to date with their annual eye exam?  Yes  Who is the provider or what is the name of the office in which the patient attends annual eye exams? [facility] If pt is not established with a provider, would they like to be referred to a provider to establish care? No .   Dental Screening: Recommended annual dental exams for proper oral hygiene  Diabetic Foot Exam: Diabetic Foot Exam: Completed 11/14/2023  Community Resource Referral / Chronic  Care Management: CRR required this visit?  No   CCM required this visit?  No     Plan:     I have personally reviewed and noted the following in the patient's chart:   Medical and social history Use of alcohol, tobacco or illicit drugs  Current medications and supplements including opioid prescriptions. Patient is not currently taking opioid prescriptions. Functional ability and status Nutritional status Physical activity Advanced directives List of other physicians Hospitalizations, surgeries, and ER visits in previous 12 months Vitals Screenings to include cognitive, depression, and falls Referrals and appointments  In addition, I have reviewed and discussed with patient certain preventive protocols, quality metrics, and best practice recommendations. A written personalized  care plan for preventive services as well as general preventive health recommendations were provided to patient.     Jordan Hawks Skylen Danielsen, CMA   01/18/2024   After Visit Summary: (MyChart) Due to this being a telephonic visit, the after visit summary with patients personalized plan was offered to patient via MyChart   Nurse Notes:

## 2024-01-31 ENCOUNTER — Other Ambulatory Visit: Payer: Self-pay | Admitting: Internal Medicine

## 2024-01-31 NOTE — Telephone Encounter (Signed)
Medication refill request complete

## 2024-02-21 ENCOUNTER — Ambulatory Visit (INDEPENDENT_AMBULATORY_CARE_PROVIDER_SITE_OTHER): Payer: Medicare Other | Admitting: Internal Medicine

## 2024-02-21 ENCOUNTER — Encounter: Payer: Self-pay | Admitting: Internal Medicine

## 2024-02-21 VITALS — BP 126/60 | HR 76 | Ht 63.0 in | Wt 189.4 lb

## 2024-02-21 DIAGNOSIS — E1165 Type 2 diabetes mellitus with hyperglycemia: Secondary | ICD-10-CM | POA: Diagnosis not present

## 2024-02-21 DIAGNOSIS — E785 Hyperlipidemia, unspecified: Secondary | ICD-10-CM

## 2024-02-21 DIAGNOSIS — E66811 Obesity, class 1: Secondary | ICD-10-CM

## 2024-02-21 DIAGNOSIS — Z7984 Long term (current) use of oral hypoglycemic drugs: Secondary | ICD-10-CM

## 2024-02-21 NOTE — Progress Notes (Signed)
 Patient ID: Angela Mays, female   DOB: 1950-12-23, 73 y.o.   MRN: [id number]   HPI: Angela Mays is a 73 y.o.-year-old female, returning for f/u for DM2, dx in 09/2014, non-insulin-dependent, uncontrolled, with complications (DR). Last visit 3.5 months ago.  Interim history: No increased urination, nausea, chest pain.  Her blurry vision resolved after cataract surgeries. She was out of town last week and ate out a lot.  She gained 5 pounds since last visit.  Reviewed HbA1c levels: Lab Results  Component Value Date   HGBA1C 7.3 (H) 09/21/2023   HGBA1C 6.5 (A) 04/13/2023   HGBA1C 6.4 (A) 12/07/2022  11/07/2019: HbA1c: 6.9%  Pt is on: - Metformin XR 1500 >> 1000 >> 500 mg twice a day (she reduced the dose due to negative publicity) >> 1000 mg in a.m. and 500 mg at night - Jardiance 25 mg before first meal of the day (on terconazole) - Glimepiride 2 >> 2-4 mg after dinner >> before dinner Rybelsus was not covered.  Pt is checking sugars  times a day: - am:  98-143 >> 94-126 >> 106-127 >> 98-131, 140 >> 96-137 - 2h after b'fast:  113-143, 163 >> 143-169 >> 141-157 >> 137, 141 - before lunch: 124, 137 >> 78, 102-137 >> 112-156, 178>> 87-113, 128 - 2h after lunch: 142, 157 >> 140-162 >> 146-187 >> 142-157 - before dinner: 87-130 >> 131-152, 162 >> 96-149, 161 - 2h after dinner: 142-181 >> 163-193 >> 162-197 >> 149-196, 204 - bedtime:  114-263 >> 87-148 >> 128-176 >> 151-179 >> 110-157 - nighttime: 68, 95-139 >> 96, 121-147 >> 121-153 >> 95-119 Lowest: 76 >> 82 >> 69 >> 78 >> 98 >> 87 Highest: 263 >> 181 >> 193 >> 197 >> 201  Glucometer: One Touch ultra mini  Pt's meals are: - Breakfast: oatmeal or PB sandwich or bacon and eggs or cereals or skips during the week - Lunch: sandwich or spaghetti or fast foods - Dinner: largest - meat + veggies + starch - Snacks: 2-3: less chocolate, potato chips,  M&M She rarely has sweet drinks.  -No CKD last BUN/creatinine:  Lab Results   Component Value Date   BUN 23 09/21/2023   BUN 14 09/14/2022   CREATININE 0.77 09/21/2023   CREATININE 0.80 09/14/2022   Lab Results  Component Value Date   MICRALBCREAT 0.9 09/21/2023   MICRALBCREAT 0.9 09/14/2022   MICRALBCREAT 0.8 07/03/2018  She is on low-dose lisinopril, 2.5 mg daily.  -+ HL; last set of lipids: Lab Results  Component Value Date   CHOL 168 09/21/2023   HDL 66.10 09/21/2023   LDLCALC 89 09/21/2023   TRIG 67.0 09/21/2023   CHOLHDL 3 09/21/2023  On atorvastatin 10 mg daily  - last eye exam was 05/18/2023:+ DR, + Cataract, no DR. On Ocuvite.  She had cataract sx 04 and 04/2022.  - no numbness and tingling in her feet.  Last foot exam 10/2023.  She has a history of endometrial cancer and had hysterectomy. She does not have a ho pancreatitis or FH of MTC. She was laid off from her job in 06/2019.  ROS: + see HPI  I reviewed pt's medications, allergies, PMH, social hx, family hx, and changes were documented in the history of present illness. Otherwise, unchanged from my initial visit note.  Past Medical History:  Diagnosis Date   Allergy Early 80's   Rash - Flagyl   Blood transfusion without reported diagnosis at birth    mom  was Rh neg and pt +   Cancer (HCC) 12/2016   ENDOMETRIAL CANCER   Cataract    Dr Meyer Russel   Diabetes mellitus without complication [facility])    TYPE 2   Genetic testing 04/07/2017   Ms. Shaffer underwent genetic counseling and testing for hereditary cancer syndromes on 03/24/2017. There were no pathogenic mutations identified in any of the 46 genes analyzed by [facility]'s 46-gene Common Hereditary Cancers Panel. Genes analyzed include: APC, ATM, AXIN2, BARD1, BMPR1A, BRCA1, BRCA2, BRIP1, CDH1, CDKN2A, CHEK2, CTNNA1, DICER1, EPCAM, GREM1, HOXB13, KIT, MEN1, MLH1, MSH2, MSH3, MSH6   Leg cramps    and feet OCC   Pneumonia YRS AGO   PONV (postoperative nausea and vomiting)    NAUSEA   Skin rash 10/21/2016   CONTACT DERMITITIS OCC    Past Surgical History:  Procedure Laterality Date   ABDOMINAL HYSTERECTOMY  01/04/2017   Dr Adolphus Birchwood   COLONOSCOPY  done at AGE 49   w/Dr.Mann=normal exam   DILATATION & CURETTAGE/HYSTEROSCOPY WITH MYOSURE N/A 11/02/2016   Procedure: DILATATION & CURETTAGE/HYSTEROSCOPY WITH MYOSURE;  Surgeon: Richarda Overlie, MD;  Location: [facility];  Service: Gynecology;  Laterality: N/A;   DILATION AND CURETTAGE OF UTERUS     EYE SURGERY  04/09/22 (L) & 05/07/22 (R)   Dr Mia Creek   LYMPH NODE BIOPSY N/A 01/04/2017   Procedure: SENTINEL LYMPH NODE BIOPSY;  Surgeon: Adolphus Birchwood, MD;  Location: [facility];  Service: Gynecology;  Laterality: N/A;   MOUTH SURGERY     posts for implants   ROBOTIC ASSISTED TOTAL HYSTERECTOMY WITH BILATERAL SALPINGO OOPHERECTOMY Bilateral 01/04/2017   Procedure: XI ROBOTIC ASSISTED TOTAL HYSTERECTOMY WITH BILATERAL SALPINGO OOPHORECTOMY;  Surgeon: Adolphus Birchwood, MD;  Location: [facility];  Service: Gynecology;  Laterality: Bilateral;   TUBAL LIGATION  YRS AGO   Social History   Social History   Marital status: Single    Spouse name: N/A   Number of children: 2   Occupational History   Print production planner    Social History Main Topics   Smoking status: Never Smoker   Smokeless tobacco: Never Used   Alcohol use No   Drug use: No   Current Outpatient Medications on File Prior to Visit  Medication Sig Dispense Refill   ASPIRIN EC LOW DOSE PO Take 81 mg by mouth daily.     atorvastatin (LIPITOR) 10 MG tablet TAKE 1 TABLET DAILY 90 tablet 3   blood glucose meter kit and supplies KIT Dispense based on patient and insurance preference. Use 2x times daily as directed. (FOR ICD-9 250.00, 250.01). 1 each 0   Cholecalciferol (D3) 50 MCG (2000 UT) TABS Take 1 tablet by mouth daily.     glimepiride (AMARYL) 2 MG tablet TAKE 1 TABLET BEFORE DINNER 90 tablet 3   JARDIANCE 25 MG TABS tablet TAKE 1 TABLET DAILY 90 tablet 3   lisinopril (ZESTRIL) 2.5 MG tablet TAKE 1 TABLET DAILY 90 tablet 3    metFORMIN (GLUCOPHAGE-XR) 500 MG 24 hr tablet Take by mouth 1000 mg in am and 500 mg in pm 270 tablet 3   nystatin-triamcinolone (MYCOLOG II) cream      Current Facility-Administered Medications on File Prior to Visit  Medication Dose Route Frequency Provider Last Rate Last Admin   0.9 %  sodium chloride infusion  500 mL Intravenous Once Nandigam, Eleonore Chiquito, MD       Allergies  Allergen Reactions   Flagyl [Metronidazole] Rash   Family History  Problem Relation  Age of Onset   Heart Problems Father    COPD Father    Heart disease Father    Cervical cancer Sister 2       full sister   Cancer Sister    Kidney disease Sister    High blood pressure Son    Alcohol abuse Son    Drug abuse Son    COPD Son    High blood pressure Son    Alcohol abuse Son    Anxiety disorder Son    Depression Son    Drug abuse Son    Breast cancer Sister 51       maternal half sister   Melanoma Sister    Cancer Sister    Hearing loss Sister    Bone cancer Mother 83       d.69   Cancer Mother    COPD Mother    Early death Maternal Grandmother    Stomach cancer Maternal Grandfather 51       d.60s   Cancer Maternal Grandfather    Colon cancer Paternal Grandmother 33       d.84   Cancer Paternal Grandmother    Diabetes Paternal Grandmother    Stroke Paternal Grandmother    Kidney cancer Paternal Grandfather 39       d.58   Cancer Paternal Grandfather    Varicose Veins Maternal Aunt    PE: BP 126/60   Pulse 76   Ht 5\' 3"  (1.6 m)   Wt 189 lb 6.4 oz (85.9 kg)   SpO2 96%   BMI 33.55 kg/m  Wt Readings from Last 3 Encounters:  02/21/24 189 lb 6.4 oz (85.9 kg)  01/18/24 184 lb (83.5 kg)  11/14/23 184 lb 6.4 oz (83.6 kg)   Constitutional: overweight, in NAD Eyes:  EOMI, no exophthalmos ENT: no neck masses, no cervical lymphadenopathy Cardiovascular: RRR, No MRG Respiratory: CTA B Musculoskeletal: no deformities Skin:no rashes Neurological: no tremor with outstretched  hands  ASSESSMENT: 1. DM2, non-insulin-dependent, uncontrolled, with long term complications - DR  2. Obesity class 1  3. HL  PLAN:  1. Patient with history of uncontrolled type 2 diabetes, with improved control after she started to improve her diet, reduce sweet drinks, and add Jardiance.  HbA1c improved but at last visit this was higher, at 7.3%.  At that time sugars were slightly higher than before, after relaxing her diet again and eating more sweets.  She was interested in increasing the dose of metformin so I advised her to do so.  We continued Jardiance at the same dose but I advised her to take 1 to 2 tablets of glipizide depending on the size of her meal. -At today's visit, sugars are either at or slightly above goal in the morning and also before and after dinner, but otherwise at goal, despite the fact that she was eating out a lot in the last week as she was out of town.  She is doing a good job checking blood sugars at different times of the day.  No lows.  In the setting of the improved HbA1c (see below), will continue the same regimen.  She is occasionally forgetting the glimepiride and we discussed about trying to remember this especially when she has a large dinner. - I suggested to:  Patient Instructions  Please continue: - Metformin XR 1000 mg in am and 500 mg at night - Jardiance 25 mg before first meal of the day  - Glimepiride 2-4 mg 15-30  min before dinner  Please return in 4 months with your sugar log.   - we checked her HbA1c: 7.1% (lower) - advised to check sugars at different times of the day - 1x a day, rotating check times - advised for yearly eye exams >> she is UTD - return to clinic in 4 months  2. Obesity class 1 -We discussed in the past about eliminating sweet tea and juice-only occasionally drinking this now -Will continue Jardiance which should also help with weight loss -She gained 1 pound before last visit, but gained 5 since then  3. HL -Latest  lipid panel was reviewed: Fractions at goal with exception of an LDL higher than our goal of less than 70 Lab Results  Component Value Date   CHOL 168 09/21/2023   HDL 66.10 09/21/2023   LDLCALC 89 09/21/2023   TRIG 67.0 09/21/2023   CHOLHDL 3 09/21/2023  -Will continue atorvastatin 10 mg daily.  No side effects. -At next visit, we will recheck her LDL.  If still elevated, may need to increase the dose of atorvastatin.  Carlus Pavlov, MD PhD [facility] Endocrinology

## 2024-02-21 NOTE — Patient Instructions (Signed)
 Please continue: - Metformin XR 1000 mg in am and 500 mg at night - Jardiance 25 mg before first meal of the day  - Glimepiride 2-4 mg 15-30 min before dinner   Please return in 4 months with your sugar log.

## 2024-06-25 ENCOUNTER — Ambulatory Visit: Admitting: Internal Medicine

## 2024-07-06 ENCOUNTER — Encounter: Payer: Self-pay | Admitting: Advanced Practice Midwife

## 2024-08-29 ENCOUNTER — Encounter: Payer: Self-pay | Admitting: Internal Medicine

## 2024-08-29 ENCOUNTER — Ambulatory Visit: Admitting: Internal Medicine

## 2024-08-29 VITALS — BP 120/60 | HR 78 | Ht 63.0 in | Wt 195.0 lb

## 2024-08-29 DIAGNOSIS — Z7984 Long term (current) use of oral hypoglycemic drugs: Secondary | ICD-10-CM | POA: Diagnosis not present

## 2024-08-29 DIAGNOSIS — E785 Hyperlipidemia, unspecified: Secondary | ICD-10-CM

## 2024-08-29 DIAGNOSIS — E66811 Obesity, class 1: Secondary | ICD-10-CM | POA: Diagnosis not present

## 2024-08-29 DIAGNOSIS — E1165 Type 2 diabetes mellitus with hyperglycemia: Secondary | ICD-10-CM

## 2024-08-29 LAB — POCT GLYCOSYLATED HEMOGLOBIN (HGB A1C): Hemoglobin A1C: 8.1 % — AB (ref 4.0–5.6)

## 2024-08-29 NOTE — Addendum Note (Signed)
 Addended by: CLEOTILDE ROLIN RAMAN on: 08/29/2024 09:27 AM   Modules accepted: Orders

## 2024-08-29 NOTE — Patient Instructions (Addendum)
 Please increase: - Metformin  XR 1500 mg with dinner  Continue: - Jardiance  25 mg in am - Glimepiride  2 mg but move this 15-30 min before dinner   Please return in 4 months with your sugar log.

## 2024-08-29 NOTE — Progress Notes (Signed)
 Patient ID: Angela Mays, female   DOB: 12-23-1950, 73 y.o.   MRN: [id number]   HPI: Angela Mays is a 73 y.o.-year-old female, returning for f/u for DM2, dx in 09/2014, non-insulin -dependent, uncontrolled, with complications (DR). Last visit 6 months ago.  Interim history: No increased urination, nausea, chest pain.  Her blurry vision resolved after cataract surgeries. Her son died suddenly in his sleep at 76 y/o.  She is grieving, has increased stress, eating out more.   Reviewed HbA1c levels: 02/21/2024: HbA1c 7.1% Lab Results  Component Value Date   HGBA1C 7.3 (H) 09/21/2023   HGBA1C 6.5 (A) 04/13/2023   HGBA1C 6.4 (A) 12/07/2022  11/07/2019: HbA1c: 6.9%  Pt is on: - Metformin  XR 1500 >> 1000 >> 500 mg twice a day (she reduced the dose due to negative publicity) >> 1000 mg in a.m. and 500 mg at night >> 1000 mg with dinner - Jardiance  25 mg before first meal of the day (on terconazole) - Glimepiride  2 >> 2-4 mg after dinner >> STILL after dinner Rybelsus  was not covered.  Pt is checking sugars  times a day: - am: 94-126 >> 106-127 >> 98-131, 140 >> 96-137 >> 111-137 - 2h after b'fast:  143-169 >> 141-157 >> 137, 141 >> 141 - before lunch: 112-156, 178>> 87-113, 128 >> 114-133, 154 - 2h after lunch: 140-162 >> 146-187 >> 142-157 >> 152-168 - before dinner:  131-152, 162 >> 96-149, 161 >> 132-142 - 2h after dinner: 163-193 >> 162-197 >> 149-196, 204 >> 178-198 - bedtime:  87-148 >> 128-176 >> 151-179 >> 110-157 >> 121-192 - nighttime: 96, 121-147 >> 121-153 >> 95-119 >> 118-172 Lowest: 76 >> 82 >> 69 >> 78 >> 98 >> 87 >> 111 Highest: 263 >> 181 >> 193 >> 197 >> 201 >> 198  Glucometer: One Touch ultra mini  Pt's meals are: - Breakfast: oatmeal or PB sandwich or bacon and eggs or cereals or skips during the week - Lunch: sandwich or spaghetti or fast foods - Dinner: largest - meat + veggies + starch - Snacks: 2-3: less chocolate, potato chips,  M&M She rarely has sweet  drinks.  -No CKD last BUN/creatinine:  Lab Results  Component Value Date   BUN 23 09/21/2023   BUN 14 09/14/2022   CREATININE 0.77 09/21/2023   CREATININE 0.80 09/14/2022   No results found for: MICRALBCREAT She is on low-dose lisinopril , 2.5 mg daily.  -+ HL; last set of lipids: Lab Results  Component Value Date   CHOL 168 09/21/2023   HDL 66.10 09/21/2023   LDLCALC 89 09/21/2023   TRIG 67.0 09/21/2023   CHOLHDL 3 09/21/2023  On atorvastatin  10 mg daily  - last eye exam was 05/18/2023:+ DR, + Cataract, no DR. On Ocuvite.  She had cataract sx 04 and 04/2022. Pending next week.  - no numbness and tingling in her feet.  Last foot exam 11/14/2023.  She has a history of endometrial cancer and had hysterectomy. She does not have a ho pancreatitis or FH of MTC. She was laid off from her job in 06/2019.  ROS: + see HPI  I reviewed pt's medications, allergies, PMH, social hx, family hx, and changes were documented in the history of present illness. Otherwise, unchanged from my initial visit note.  Past Medical History:  Diagnosis Date   Allergy Early 80's   Rash - Flagyl   Blood transfusion without reported diagnosis at birth    mom was Rh neg and pt +  Cancer (HCC) 12/2016   ENDOMETRIAL CANCER   Cataract    Dr Rosalynn Lucky   Diabetes mellitus without complication [facility])    TYPE 2   Genetic testing 04/07/2017   Ms. Wanek underwent genetic counseling and testing for hereditary cancer syndromes on 03/24/2017. There were no pathogenic mutations identified in any of the 46 genes analyzed by [facility]'s 46-gene Common Hereditary Cancers Panel. Genes analyzed include: APC, ATM, AXIN2, BARD1, BMPR1A, BRCA1, BRCA2, BRIP1, CDH1, CDKN2A, CHEK2, CTNNA1, DICER1, EPCAM, GREM1, HOXB13, KIT, MEN1, MLH1, MSH2, MSH3, MSH6   Leg cramps    and feet OCC   Pneumonia YRS AGO   PONV (postoperative nausea and vomiting)    NAUSEA   Skin rash 10/21/2016   CONTACT DERMITITIS OCC   Past Surgical  History:  Procedure Laterality Date   ABDOMINAL HYSTERECTOMY  01/04/2017   Dr Maurilio Ship   COLONOSCOPY  done at AGE 46   w/Dr.Mann=normal exam   DILATATION & CURETTAGE/HYSTEROSCOPY WITH MYOSURE N/A 11/02/2016   Procedure: DILATATION & CURETTAGE/HYSTEROSCOPY WITH MYOSURE;  Surgeon: Charlie Croak, MD;  Location: [facility];  Service: Gynecology;  Laterality: N/A;   DILATION AND CURETTAGE OF UTERUS     EYE SURGERY  04/09/22 (L) & 05/07/22 (R)   Dr Evalene Raw   LYMPH NODE BIOPSY N/A 01/04/2017   Procedure: SENTINEL LYMPH NODE BIOPSY;  Surgeon: Maurilio Ship, MD;  Location: [facility];  Service: Gynecology;  Laterality: N/A;   MOUTH SURGERY     posts for implants   ROBOTIC ASSISTED TOTAL HYSTERECTOMY WITH BILATERAL SALPINGO OOPHERECTOMY Bilateral 01/04/2017   Procedure: XI ROBOTIC ASSISTED TOTAL HYSTERECTOMY WITH BILATERAL SALPINGO OOPHORECTOMY;  Surgeon: Maurilio Ship, MD;  Location: [facility];  Service: Gynecology;  Laterality: Bilateral;   TUBAL LIGATION  YRS AGO   Social History   Social History   Marital status: Single    Spouse name: N/A   Number of children: 2   Occupational History   Print production planner    Social History Main Topics   Smoking status: Never Smoker   Smokeless tobacco: Never Used   Alcohol use No   Drug use: No   Current Outpatient Medications on File Prior to Visit  Medication Sig Dispense Refill   ASPIRIN EC LOW DOSE PO Take 81 mg by mouth daily.     atorvastatin  (LIPITOR) 10 MG tablet TAKE 1 TABLET DAILY 90 tablet 3   blood glucose meter kit and supplies KIT Dispense based on patient and insurance preference. Use 2x times daily as directed. (FOR ICD-9 250.00, 250.01). 1 each 0   Cholecalciferol (D3) 50 MCG (2000 UT) TABS Take 1 tablet by mouth daily.     glimepiride  (AMARYL ) 2 MG tablet TAKE 1 TABLET BEFORE DINNER 90 tablet 3   JARDIANCE  25 MG TABS tablet TAKE 1 TABLET DAILY 90 tablet 3   lisinopril  (ZESTRIL ) 2.5 MG tablet TAKE 1 TABLET DAILY 90 tablet 3   metFORMIN   (GLUCOPHAGE -XR) 500 MG 24 hr tablet Take by mouth 1000 mg in am and 500 mg in pm 270 tablet 3   nystatin -triamcinolone (MYCOLOG II) cream      Current Facility-Administered Medications on File Prior to Visit  Medication Dose Route Frequency Provider Last Rate Last Admin   0.9 %  sodium chloride  infusion  500 mL Intravenous Once Nandigam, Kavitha V, MD       Allergies  Allergen Reactions   Flagyl [Metronidazole] Rash   Family History  Problem Relation Age of Onset   Heart Problems Father  COPD Father    Heart disease Father    Cervical cancer Sister 56       full sister   Cancer Sister    Kidney disease Sister    High blood pressure Son    Alcohol abuse Son    Drug abuse Son    COPD Son    High blood pressure Son    Alcohol abuse Son    Anxiety disorder Son    Depression Son    Drug abuse Son    Breast cancer Sister 16       maternal half sister   Melanoma Sister    Cancer Sister    Hearing loss Sister    Bone cancer Mother 83       d.69   Cancer Mother    COPD Mother    Early death Maternal Grandmother    Stomach cancer Maternal Grandfather 56       d.60s   Cancer Maternal Grandfather    Colon cancer Paternal Grandmother 30       d.84   Cancer Paternal Grandmother    Diabetes Paternal Grandmother    Stroke Paternal Grandmother    Kidney cancer Paternal Grandfather 25       d.58   Cancer Paternal Grandfather    Varicose Veins Maternal Aunt    PE: BP 120/60   Pulse 78   Ht 5' 3 (1.6 m)   Wt 195 lb (88.5 kg)   SpO2 98%   BMI 34.54 kg/m  Wt Readings from Last 3 Encounters:  08/29/24 195 lb (88.5 kg)  02/21/24 189 lb 6.4 oz (85.9 kg)  01/18/24 184 lb (83.5 kg)   Constitutional: overweight, in NAD Eyes:  EOMI, no exophthalmos ENT: no neck masses, no cervical lymphadenopathy Cardiovascular: RRR, No MRG Respiratory: CTA B Musculoskeletal: no deformities Skin:no rashes Neurological: no tremor with outstretched hands Diabetic Foot Exam - Simple    Simple Foot Form Diabetic Foot exam was performed with the following findings: Yes 08/29/2024  8:42 AM  Visual Inspection No deformities, no ulcerations, no other skin breakdown bilaterally: Yes Sensation Testing Intact to touch and monofilament testing bilaterally: Yes Pulse Check Posterior Tibialis and Dorsalis pulse intact bilaterally: Yes Comments Onychodystrophy B toenails    ASSESSMENT: 1. DM2, non-insulin -dependent, uncontrolled, with long term complications - DR  2. Obesity class 1  3. HL  PLAN:  1. Patient with history of uncontrolled type 2 diabetes, with improved control after she started to improve her diet, reduce sweet drinks and add Jardiance .  At last visit, HbA1c was lower, at 7.1%, decreased from 7.3%.  Sugars appears to be either at or slightly above goal in the morning and also before and after dinner, but she mentioned eating out a lot as she was traveling.  In the setting of the improved HbA1c, I did not recommend a change in regimen but we did discuss about trying to remember glimepiride  especially after she had a large dinner. - at today's visit, she returns after having had major stress with her son dying suddenly at 63 years old 6 months ago.  She is grieving and was not able to control her diet, and also missing diabetic medication doses since last visit.  However, per review of her blood sugar log, sugars are not much changed from before, it is possible that they improved lately.  For the last 2 weeks she is missing metformin  in the morning so we discussed about increasing the dose with dinner.  Also, she is still taking glimepiride  after dinners and we again discussed about the importance of moving this before dinner to avoid hypoglycemia overnight. - I suggested to:  Patient Instructions  Please increase: - Metformin  XR 1500 mg with dinner  Continue: - Jardiance  25 mg in am - Glimepiride  2 mg but move this 15-30 min before dinner   Please return in 4  months with your sugar log.   - we checked her HbA1c: 8.1% (higher) - advised to check sugars at different times of the day - 1x a day, rotating check times - advised for yearly eye exams >> she is due -scheduled for next week - will check annual labs today - return to clinic in 4 months  2. Obesity class 1 - We discussed in the past about eliminating sweet tea and juice - Will continue Jardiance  which should also help with weight loss - She gained 5 pounds before last visit and gained 6 pounds since then  3. HL -Latest lipid panel was reviewed from 09/2023: LDL above target, otherwise fractions at goal: Lab Results  Component Value Date   CHOL 168 09/21/2023   HDL 66.10 09/21/2023   LDLCALC 89 09/21/2023   TRIG 67.0 09/21/2023   CHOLHDL 3 09/21/2023  - Will continue atorvastatin  10 mg daily. - Will need to recheck her lipid panel today.  She may need an increase in atorvastatin  dose.  Lela Fendt, MD PhD [facility] Endocrinology

## 2024-08-30 ENCOUNTER — Ambulatory Visit: Payer: Self-pay | Admitting: Internal Medicine

## 2024-08-30 LAB — COMPREHENSIVE METABOLIC PANEL WITH GFR
AG Ratio: 1.4 (calc) (ref 1.0–2.5)
ALT: 11 U/L (ref 6–29)
AST: 14 U/L (ref 10–35)
Albumin: 4 g/dL (ref 3.6–5.1)
Alkaline phosphatase (APISO): 138 U/L (ref 37–153)
BUN/Creatinine Ratio: 33 (calc) — ABNORMAL HIGH (ref 6–22)
BUN: 30 mg/dL — ABNORMAL HIGH (ref 7–25)
CO2: 28 mmol/L (ref 20–32)
Calcium: 9.5 mg/dL (ref 8.6–10.4)
Chloride: 102 mmol/L (ref 98–110)
Creat: 0.9 mg/dL (ref 0.60–1.00)
Globulin: 2.8 g/dL (ref 1.9–3.7)
Glucose, Bld: 166 mg/dL — ABNORMAL HIGH (ref 65–99)
Potassium: 4.5 mmol/L (ref 3.5–5.3)
Sodium: 138 mmol/L (ref 135–146)
Total Bilirubin: 0.5 mg/dL (ref 0.2–1.2)
Total Protein: 6.8 g/dL (ref 6.1–8.1)
eGFR: 67 mL/min/1.73m2 (ref >=60)

## 2024-08-30 LAB — MICROALBUMIN / CREATININE URINE RATIO
Creatinine, Urine: 82 mg/dL (ref 20–275)
Microalb Creat Ratio: 2 mg/g{creat} (ref <30)
Microalb, Ur: 0.2 mg/dL

## 2024-08-30 LAB — LIPID PANEL W/REFLEX DIRECT LDL
Cholesterol: 161 mg/dL (ref <200)
HDL: 67 mg/dL (ref >=50)
LDL Cholesterol (Calc): 79 mg/dL
Non-HDL Cholesterol (Calc): 94 mg/dL (ref <130)
Total CHOL/HDL Ratio: 2.4 (calc) (ref <5.0)
Triglycerides: 73 mg/dL (ref <150)

## 2024-09-13 DIAGNOSIS — E119 Type 2 diabetes mellitus without complications: Secondary | ICD-10-CM | POA: Diagnosis not present

## 2024-09-13 LAB — HM DIABETES EYE EXAM

## 2024-09-24 ENCOUNTER — Encounter: Payer: Medicare Other | Admitting: Family Medicine

## 2024-10-10 ENCOUNTER — Other Ambulatory Visit: Payer: Self-pay | Admitting: Family Medicine

## 2024-10-10 NOTE — Telephone Encounter (Signed)
 Needs appt

## 2024-10-16 ENCOUNTER — Other Ambulatory Visit: Payer: Self-pay | Admitting: Internal Medicine

## 2025-01-02 ENCOUNTER — Ambulatory Visit (INDEPENDENT_AMBULATORY_CARE_PROVIDER_SITE_OTHER): Admitting: Internal Medicine

## 2025-01-02 ENCOUNTER — Encounter: Payer: Self-pay | Admitting: Internal Medicine

## 2025-01-02 VITALS — BP 130/60 | HR 72 | Ht 63.0 in | Wt 196.6 lb

## 2025-01-02 DIAGNOSIS — Z7984 Long term (current) use of oral hypoglycemic drugs: Secondary | ICD-10-CM | POA: Diagnosis not present

## 2025-01-02 DIAGNOSIS — E11319 Type 2 diabetes mellitus with unspecified diabetic retinopathy without macular edema: Secondary | ICD-10-CM

## 2025-01-02 DIAGNOSIS — E785 Hyperlipidemia, unspecified: Secondary | ICD-10-CM

## 2025-01-02 DIAGNOSIS — E1165 Type 2 diabetes mellitus with hyperglycemia: Secondary | ICD-10-CM

## 2025-01-02 DIAGNOSIS — Z6834 Body mass index (BMI) 34.0-34.9, adult: Secondary | ICD-10-CM | POA: Diagnosis not present

## 2025-01-02 DIAGNOSIS — E66811 Obesity, class 1: Secondary | ICD-10-CM

## 2025-01-02 LAB — POCT GLYCOSYLATED HEMOGLOBIN (HGB A1C): Hemoglobin A1C: 7.2 % — AB (ref 4.0–5.6)

## 2025-01-02 MED ORDER — EMPAGLIFLOZIN 25 MG PO TABS: 25.0000 mg | ORAL_TABLET | Freq: Every day | ORAL | 3 refills | Status: AC

## 2025-01-02 MED ORDER — GLIMEPIRIDE 2 MG PO TABS: ORAL_TABLET | ORAL | 3 refills | Status: AC

## 2025-01-02 NOTE — Patient Instructions (Addendum)
 Please continue: - Metformin  XR 1500 mg with dinner - Jardiance  25 mg in am - Glimepiride  2-4 mg 15-30 min before dinner   Please return in 4 months with your sugar log.

## 2025-01-02 NOTE — Progress Notes (Signed)
 Patient ID: Angela Mays, female   DOB: Oct 09, 1951, 74 y.o.   MRN: [id number]   HPI: Angela Mays is a 74 y.o.-year-old female, returning for f/u for DM2, dx in 09/2014, non-insulin -dependent, uncontrolled, with complications (DR). Last visit 4 months ago.  Interim history: No increased urination, nausea, chest pain.  Her blurry vision resolved after cataract surgeries. Her son died suddenly in his sleep at 43 y/o before our last visit.  She was grieving, has increased stress, eating out more.  She is now feeling little better.  Reviewed HbA1c levels: Lab Results  Component Value Date   HGBA1C 8.1 (A) 08/29/2024   HGBA1C 7.3 (H) 09/21/2023   HGBA1C 6.5 (A) 04/13/2023  02/21/2024: HbA1c 7.1% 11/07/2019: HbA1c: 6.9%  Pt is on: - Metformin  XR 1500 >> 1000 >> 500 mg twice a day (she reduced the dose due to negative publicity) >> 1000 mg in a.m. and 500 mg at night >> 1000 mg with dinner only (forgetting the morning dose) >> 1500 mg with dinner - Jardiance  25 mg before first meal of the day (on terconazole) - Glimepiride  2 >> 2-4 mg after dinner >> STILL after dinner >> most before dinner Rybelsus  was not covered.  Pt is checking sugars  times a day: - am: 106-127 >> 98-131, 140 >> 96-137 >> 111-137 >> 73-127 - 2h after b'fast:  143-169 >> 141-157 >> 137, 141 >> 141 >> 94-129 - before lunch: 112-156, 178>> 87-113, 128 >> 114-133, 154 >> 97-128 - 2h after lunch: 146-187 >> 142-157 >> 152-168 >> 133-163, 184 - before dinner:  131-152, 162 >> 96-149, 161 >> 132-142 >> 78-151 - 2h after dinner: 162-197 >> 149-196, 204 >> 178-198 >> 65-181 - bedtime:  128-176 >> 151-179 >> 110-157 >> 121-192 >> 99-132 - nighttime: 96, 121-147 >> 121-153 >> 95-119 >> 118-172 >> 92-128 Lowest: 69 >> ... 87 >> 111 >> 65 (took 2 Amaryl  and delayed the meal). Highest: 263 >> .SABRA.201 >> 198 >> 181.  Glucometer: One Touch ultra mini  Pt's meals are:. - Breakfast: oatmeal or PB sandwich or bacon and eggs or  cereals or skips during the week - Lunch: sandwich or spaghetti or fast foods - Dinner: largest - meat + veggies + starch - Snacks: 2-3: less chocolate, potato chips,  M&M She rarely has sweet drinks.  -No CKD last BUN/creatinine:  Lab Results  Component Value Date   BUN 30 (H) 08/29/2024   BUN 23 09/21/2023   CREATININE 0.90 08/29/2024   CREATININE 0.77 09/21/2023   Lab Results  Component Value Date   MICRALBCREAT 2 08/29/2024  She is on low-dose lisinopril , 2.5 mg daily.  -+ HL; last set of lipids: Lab Results  Component Value Date   CHOL 161 08/29/2024   HDL 67 08/29/2024   LDLCALC 79 08/29/2024   TRIG 73 08/29/2024   CHOLHDL 2.4 08/29/2024  On atorvastatin  10 mg daily  - last eye exam was 09/13/2024: + DR in [place]. On Ocuvite.  She had cataract sx 04 and 04/2022.  She  - no numbness and tingling in her feet.  Last foot exam 08/29/2024.  She has a history of endometrial cancer and had hysterectomy. She does not have a ho pancreatitis or FH of MTC. She was laid off from her job in 06/2019.  ROS: + see HPI  I reviewed pt's medications, allergies, PMH, social hx, family hx, and changes were documented in the history of present illness. Otherwise, unchanged from my initial visit  note.  Past Medical History:  Diagnosis Date   Allergy Early 80's   Rash - Flagyl   Blood transfusion without reported diagnosis at birth    mom was Rh neg and pt +   Cancer (HCC) 12/2016   ENDOMETRIAL CANCER   Cataract    Dr Rosalynn Lucky   Diabetes mellitus without complication [facility])    TYPE 2   Genetic testing 04/07/2017   Ms. Bello underwent genetic counseling and testing for hereditary cancer syndromes on 03/24/2017. There were no pathogenic mutations identified in any of the 46 genes analyzed by [facility]'s 46-gene Common Hereditary Cancers Panel. Genes analyzed include: APC, ATM, AXIN2, BARD1, BMPR1A, BRCA1, BRCA2, BRIP1, CDH1, CDKN2A, CHEK2, CTNNA1, DICER1, EPCAM, GREM1, HOXB13, KIT, MEN1,  MLH1, MSH2, MSH3, MSH6   Leg cramps    and feet OCC   Pneumonia YRS AGO   PONV (postoperative nausea and vomiting)    NAUSEA   Skin rash 10/21/2016   CONTACT DERMITITIS OCC   Past Surgical History:  Procedure Laterality Date   ABDOMINAL HYSTERECTOMY  01/04/2017   Dr Maurilio Ship   COLONOSCOPY  done at AGE 19   w/Dr.Mann=normal exam   DILATATION & CURETTAGE/HYSTEROSCOPY WITH MYOSURE N/A 11/02/2016   Procedure: DILATATION & CURETTAGE/HYSTEROSCOPY WITH MYOSURE;  Surgeon: Charlie Croak, MD;  Location: [facility];  Service: Gynecology;  Laterality: N/A;   DILATION AND CURETTAGE OF UTERUS     EYE SURGERY  04/09/22 (L) & 05/07/22 (R)   Dr Evalene Raw   LYMPH NODE BIOPSY N/A 01/04/2017   Procedure: SENTINEL LYMPH NODE BIOPSY;  Surgeon: Maurilio Ship, MD;  Location: [facility];  Service: Gynecology;  Laterality: N/A;   MOUTH SURGERY     posts for implants   ROBOTIC ASSISTED TOTAL HYSTERECTOMY WITH BILATERAL SALPINGO OOPHERECTOMY Bilateral 01/04/2017   Procedure: XI ROBOTIC ASSISTED TOTAL HYSTERECTOMY WITH BILATERAL SALPINGO OOPHORECTOMY;  Surgeon: Maurilio Ship, MD;  Location: [facility];  Service: Gynecology;  Laterality: Bilateral;   TUBAL LIGATION  YRS AGO   Social History   Social History   Marital status: Single    Spouse name: N/A   Number of children: 2   Occupational History   Print production planner    Social History Main Topics   Smoking status: Never Smoker   Smokeless tobacco: Never Used   Alcohol use No   Drug use: No   Current Outpatient Medications on File Prior to Visit  Medication Sig Dispense Refill   ASPIRIN EC LOW DOSE PO Take 81 mg by mouth daily.     atorvastatin  (LIPITOR) 10 MG tablet TAKE 1 TABLET DAILY 90 tablet 0   blood glucose meter kit and supplies KIT Dispense based on patient and insurance preference. Use 2x times daily as directed. (FOR ICD-9 250.00, 250.01). 1 each 0   Cholecalciferol (D3) 50 MCG (2000 UT) TABS Take 1 tablet by mouth daily.     glimepiride  (AMARYL ) 2 MG  tablet TAKE 1 TABLET BEFORE DINNER 90 tablet 3   JARDIANCE  25 MG TABS tablet TAKE 1 TABLET DAILY 90 tablet 3   lisinopril  (ZESTRIL ) 2.5 MG tablet TAKE 1 TABLET DAILY 90 tablet 0   metFORMIN  (GLUCOPHAGE -XR) 500 MG 24 hr tablet TAKE 3 TABLETS (1500 MG) by mouth with dinner 270 tablet 3   nystatin -triamcinolone (MYCOLOG II) cream      Current Facility-Administered Medications on File Prior to Visit  Medication Dose Route Frequency Provider Last Rate Last Admin   0.9 %  sodium chloride  infusion  500 mL  Intravenous Once Nandigam, Kavitha V, MD       Allergies  Allergen Reactions   Flagyl [Metronidazole] Rash   Family History  Problem Relation Age of Onset   Heart Problems Father    COPD Father    Heart disease Father    Cervical cancer Sister 28       full sister   Cancer Sister    Kidney disease Sister    High blood pressure Son    Alcohol abuse Son    Drug abuse Son    COPD Son    High blood pressure Son    Alcohol abuse Son    Anxiety disorder Son    Depression Son    Drug abuse Son    Breast cancer Sister 28       maternal half sister   Melanoma Sister    Cancer Sister    Hearing loss Sister    Bone cancer Mother 43       d.69   Cancer Mother    COPD Mother    Early death Maternal Grandmother    Stomach cancer Maternal Grandfather 60       d.60s   Cancer Maternal Grandfather    Colon cancer Paternal Grandmother 44       d.84   Cancer Paternal Grandmother    Diabetes Paternal Grandmother    Stroke Paternal Grandmother    Kidney cancer Paternal Grandfather 56       d.58   Cancer Paternal Grandfather    Varicose Veins Maternal Aunt    PE: BP 130/60   Pulse 72   Ht 5' 3 (1.6 m)   Wt 196 lb 9.6 oz (89.2 kg)   SpO2 96%   BMI 34.83 kg/m  Wt Readings from Last 3 Encounters:  01/02/25 196 lb 9.6 oz (89.2 kg)  08/29/24 195 lb (88.5 kg)  02/21/24 189 lb 6.4 oz (85.9 kg)   Constitutional: overweight, in NAD Eyes:  EOMI, no exophthalmos ENT: no neck masses, no  cervical lymphadenopathy Cardiovascular: RRR, No MRG Respiratory: CTA B Musculoskeletal: no deformities Skin:no rashes Neurological: no tremor with outstretched hands  ASSESSMENT: 1. DM2, non-insulin -dependent, uncontrolled, with long term complications - DR  2. Obesity class 1  3. HL  PLAN:  1. Patient with uncontrolled type 2 diabetes, previously with improved control after she started to improve her diet, reduce sweet drinks and adding Jardiance  but with HbA1c worsening after the sudden death of her son last year.  He was 53.  She was grieving and not able to control her diet and also missing many diabetic medications.  However, sugars were not much changed from before and possibly improved before the last visit.  She was taking glimepiride  after dinner and we discussed about the importance of moving this 15 to 30 minutes before the meals avoid hypoglycemia overnight.  I also advised her to move the morning metformin  dose with dinner as she was forgetting the breakfast dose. - HbA1c at last visit was higher, at 8.1%. - At today's visit, sugars are much improved, most at goal, with only occasional mild hyperglycemic spikes especially during the holidays.  However, in the last 2 weeks, almost all blood sugars are at goal.  No need to change the regimen for now -will continue to have her take 2 tablets of Amaryl  before a larger meal, particularly if eating out.  I refilled her Jardiance  and Amaryl  prescriptions. - I suggested to:  Patient Instructions  Please continue: -  Metformin  XR 1500 mg with dinner - Jardiance  25 mg in am - Glimepiride  2-4 mg 15-30 min before dinner   Please return in 3-4 months with your sugar log.   - we checked her HbA1c: 7.2% (lower) - advised to check sugars at different times of the day - 1x a day, rotating check times - advised for yearly eye exams >> she is UTD - return to clinic in 3-4 months  2. Obesity class 1 - We previously discussed in the past  about eliminating sweet tea and juice - Will continue Jardiance  which should also help with weight loss - She gained 11 pounds before the last 2 visits combined - She gained 1 pound since last visit  3. HL - Latest lipid panel was reviewed from last visit: Fractions at goal with the exception of an LDL slightly above target, but improved: Lab Results  Component Value Date   CHOL 161 08/29/2024   HDL 67 08/29/2024   LDLCALC 79 08/29/2024   TRIG 73 08/29/2024   CHOLHDL 2.4 08/29/2024  - Will continue atorvastatin  10 mg daily.  No side effects.  Lela Fendt, MD PhD [facility]

## 2025-01-03 NOTE — Addendum Note (Signed)
 Addended by: CLEOTILDE ROLIN RAMAN on: 01/03/2025 01:01 PM   Modules accepted: Orders

## 2025-01-08 ENCOUNTER — Other Ambulatory Visit: Payer: Self-pay | Admitting: Family Medicine

## 2025-01-09 ENCOUNTER — Other Ambulatory Visit: Payer: Self-pay | Admitting: Medical Genetics

## 2025-01-16 ENCOUNTER — Encounter: Admitting: Family Medicine

## 2025-01-21 ENCOUNTER — Ambulatory Visit: Payer: Medicare Other

## 2025-04-15 ENCOUNTER — Encounter: Admitting: Family Medicine

## 2025-05-07 ENCOUNTER — Ambulatory Visit: Admitting: Internal Medicine
